# Patient Record
Sex: Female | Born: 1940 | Race: White | Hispanic: No | Smoking: Never smoker
Health system: Southern US, Community
[De-identification: ages and names within clinical notes are randomized; demographics above are authoritative.]

## PROBLEM LIST (undated history)

## (undated) DIAGNOSIS — F0390 Unspecified dementia without behavioral disturbance: Secondary | ICD-10-CM

## (undated) DIAGNOSIS — Z950 Presence of cardiac pacemaker: Secondary | ICD-10-CM

## (undated) DIAGNOSIS — F329 Major depressive disorder, single episode, unspecified: Secondary | ICD-10-CM

## (undated) DIAGNOSIS — E039 Hypothyroidism, unspecified: Secondary | ICD-10-CM

## (undated) DIAGNOSIS — F32A Depression, unspecified: Secondary | ICD-10-CM

## (undated) DIAGNOSIS — I499 Cardiac arrhythmia, unspecified: Secondary | ICD-10-CM

## (undated) DIAGNOSIS — F419 Anxiety disorder, unspecified: Secondary | ICD-10-CM

## (undated) DIAGNOSIS — D649 Anemia, unspecified: Secondary | ICD-10-CM

## (undated) HISTORY — PX: PACEMAKER INSERTION: SHX728

## (undated) HISTORY — PX: OTHER SURGICAL HISTORY: SHX169

## (undated) HISTORY — PX: BREAST CYST EXCISION: SHX579

## (undated) HISTORY — PX: INSERT / REPLACE / REMOVE PACEMAKER: SUR710

## (undated) HISTORY — PX: EYE SURGERY: SHX253

---

## 2003-06-03 HISTORY — PX: FRACTURE SURGERY: SHX138

## 2006-06-02 HISTORY — PX: OTHER SURGICAL HISTORY: SHX169

## 2013-11-11 DIAGNOSIS — I442 Atrioventricular block, complete: Secondary | ICD-10-CM | POA: Insufficient documentation

## 2014-01-23 DIAGNOSIS — E079 Disorder of thyroid, unspecified: Secondary | ICD-10-CM | POA: Insufficient documentation

## 2014-03-07 ENCOUNTER — Ambulatory Visit: Payer: Self-pay | Admitting: Family Medicine

## 2015-02-01 ENCOUNTER — Other Ambulatory Visit: Payer: Self-pay | Admitting: Internal Medicine

## 2015-02-01 DIAGNOSIS — Z1231 Encounter for screening mammogram for malignant neoplasm of breast: Secondary | ICD-10-CM

## 2015-02-14 ENCOUNTER — Ambulatory Visit: Payer: Self-pay

## 2015-02-20 ENCOUNTER — Ambulatory Visit
Admission: RE | Admit: 2015-02-20 | Discharge: 2015-02-20 | Disposition: A | Discharge status: Home / Self Care | Payer: Medicare Other | Source: Ambulatory Visit | Attending: Internal Medicine | Admitting: Internal Medicine

## 2015-02-20 DIAGNOSIS — Z1231 Encounter for screening mammogram for malignant neoplasm of breast: Secondary | ICD-10-CM | POA: Insufficient documentation

## 2015-08-30 DIAGNOSIS — Z950 Presence of cardiac pacemaker: Secondary | ICD-10-CM | POA: Insufficient documentation

## 2016-04-11 ENCOUNTER — Other Ambulatory Visit: Payer: Self-pay | Admitting: Internal Medicine

## 2016-04-11 DIAGNOSIS — Z1231 Encounter for screening mammogram for malignant neoplasm of breast: Secondary | ICD-10-CM

## 2016-05-28 ENCOUNTER — Ambulatory Visit: Payer: Medicare Other

## 2016-06-16 ENCOUNTER — Ambulatory Visit
Admission: RE | Admit: 2016-06-16 | Discharge: 2016-06-16 | Disposition: A | Discharge status: Home / Self Care | Payer: Medicare Other | Source: Ambulatory Visit | Attending: Internal Medicine | Admitting: Internal Medicine

## 2016-06-16 DIAGNOSIS — Z1231 Encounter for screening mammogram for malignant neoplasm of breast: Secondary | ICD-10-CM | POA: Diagnosis not present

## 2016-07-21 ENCOUNTER — Encounter: Payer: Self-pay | Admitting: Podiatry

## 2016-07-21 ENCOUNTER — Ambulatory Visit (INDEPENDENT_AMBULATORY_CARE_PROVIDER_SITE_OTHER): Payer: Medicare Other | Admitting: Podiatry

## 2016-07-21 DIAGNOSIS — M79676 Pain in unspecified toe(s): Secondary | ICD-10-CM | POA: Diagnosis not present

## 2016-07-21 DIAGNOSIS — B351 Tinea unguium: Secondary | ICD-10-CM

## 2016-07-21 NOTE — Progress Notes (Signed)
   Subjective:    Patient ID: Teresa PavlovLinda Richard, female    DOB: 03/24/1941, 76 y.o.   MRN: [id number]  HPI this patient presents to the office with chief complaint of painful long thick nails on both big toes.  They are painful walking and wearing her shoes. She says she has been previously treated for fungus in the right great toenail. She says she has pain along the outside border the big toe of the right foot, especially no evidence of any redness, swelling, pain or drainage. She presents the office for definitive evaluation and treatment    Review of Systems     Objective:   Physical Exam GENERAL APPEARANCE: Alert, conversant. Appropriately groomed. No acute distress.  VASCULAR: Pedal pulses are  palpable at  [facility] bilateral.  Capillary refill time is immediate to all digits,  Normal temperature gradient.  Digital hair growth is present bilateral  NEUROLOGIC: sensation is normal to 5.07 monofilament at 5/5 sites bilateral.  Light touch is intact bilateral, Muscle strength normal.  MUSCULOSKELETAL: acceptable muscle strength, tone and stability bilateral.  Intrinsic muscluature intact bilateral.   HAV  B/L with hammer toes 2,3  B/L  DERMATOLOGIC: skin color, texture, and turgor are within normal limits.  No preulcerative lesions or ulcers  are seen, no interdigital maceration noted.  No open lesions present.  . No drainage noted.  NAILS  Thick disfigured discolored nails hallux  B/L         Assessment & Plan:  Onychomycosis  B/L  IE  Debride nails  RTC 4 months.   Helane GuntherGregory Mayer DPM

## 2016-10-31 ENCOUNTER — Encounter: Payer: Self-pay | Admitting: Emergency Medicine

## 2016-10-31 ENCOUNTER — Emergency Department
Admission: EM | Admit: 2016-10-31 | Discharge: 2016-10-31 | Disposition: A | Discharge status: Home / Self Care | Payer: Medicare Other | Attending: Emergency Medicine | Admitting: Emergency Medicine

## 2016-10-31 ENCOUNTER — Emergency Department: Payer: Medicare Other

## 2016-10-31 DIAGNOSIS — Z79899 Other long term (current) drug therapy: Secondary | ICD-10-CM | POA: Diagnosis not present

## 2016-10-31 DIAGNOSIS — M25551 Pain in right hip: Secondary | ICD-10-CM | POA: Insufficient documentation

## 2016-10-31 DIAGNOSIS — Z7982 Long term (current) use of aspirin: Secondary | ICD-10-CM | POA: Diagnosis not present

## 2016-10-31 DIAGNOSIS — Z950 Presence of cardiac pacemaker: Secondary | ICD-10-CM | POA: Insufficient documentation

## 2016-10-31 MED ORDER — TRAMADOL HCL 50 MG PO TABS
50.0000 mg | ORAL_TABLET | Freq: Once | ORAL | Status: AC
  Administered 2016-10-31: 50 mg via ORAL
  Filled 2016-10-31: qty 1

## 2016-10-31 MED ORDER — CYCLOBENZAPRINE HCL 10 MG PO TABS
10.0000 mg | ORAL_TABLET | Freq: Two times a day (BID) | ORAL | 0 refills | Status: DC | PRN
Start: 2016-10-31 — End: 2018-07-07

## 2016-10-31 MED ORDER — KETOROLAC TROMETHAMINE 30 MG/ML IJ SOLN
30.0000 mg | Freq: Once | INTRAMUSCULAR | Status: AC
  Administered 2016-10-31: 30 mg via INTRAMUSCULAR
  Filled 2016-10-31: qty 1

## 2016-10-31 MED ORDER — TRAMADOL HCL 50 MG PO TABS: 50.0000 mg | ORAL_TABLET | Freq: Two times a day (BID) | ORAL | 0 refills | Status: DC | PRN

## 2016-10-31 NOTE — ED Triage Notes (Signed)
C/O right hip pain x 1 day.  Denies injury.  States pain started last night after she slid into a booth and when she slid out of booth, patient states hip hurt and it hurt to walk.

## 2016-10-31 NOTE — ED Provider Notes (Signed)
[facility] Emergency Department Provider Note   ____________________________________________   First MD Initiated Contact with Patient 10/31/16 1019     (approximate)  I have reviewed the triage vital signs and the nursing notes.   HISTORY  Chief Complaint Hip Pain    HPI Teresa Richard is a 76 y.o. female patient complaining of right hip pain for 1 day. 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 Pain increase with standing and ambulation. Patient stated no relief with conservative measures consisting of a heating pad. Patient rates the pain as a 7/10. Patient described a pain as "achy". Patient has a pacemaker History reviewed. No pertinent past medical history.  Patient Active Problem List   Diagnosis Date Noted  . 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 Thyroid disease 01/23/2014  . 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 BREAST CYST EXCISION Left 1980's   negative  . PACEMAKER INSERTION      Prior to Admission medications   Medication Sig Start Date End Date Taking? Authorizing Provider  aspirin EC 81 MG tablet Take by mouth.    [provider]  Cholecalciferol (VITAMIN D3) 1000 units CAPS Take by mouth.    [provider]  Cyanocobalamin (B-12) 500 MCG TABS Take by mouth.    [provider]  cyclobenzaprine (FLEXERIL) 10 MG tablet Take 1 tablet (10 mg total) by mouth 2 (two) times daily as needed. 10/31/16   Joni Reining, PA-C  levothyroxine (SYNTHROID, LEVOTHROID) 75 MCG tablet Take by mouth. 03/03/16   [provider]  levothyroxine (SYNTHROID, LEVOTHROID) 75 MCG tablet  07/07/16   [provider]  magnesium oxide (MAG-OX) 400 MG tablet Take by mouth.    [provider]  traMADol (ULTRAM) 50 MG tablet Take 1 tablet (50 mg total) by mouth every 12 (twelve) hours as needed.  10/31/16   Joni Reining, PA-C  traMADol (ULTRAM) 50 MG tablet Take 1 tablet (50 mg total) by mouth every 12 (twelve) hours as needed. 10/31/16   Joni Reining, PA-C    Allergies Hydrocodone and Oxycodone  Family History  Problem Relation Age of Onset  . Breast cancer Neg Hx     Social History Social History  Substance Use Topics  . Smoking status: Unknown If Ever Smoked  . Smokeless tobacco: Never Used  . Alcohol use Not on file    Review of Systems  Constitutional: No fever/chills Eyes: No visual changes. ENT: No sore throat. Cardiovascular: Denies chest pain. Respiratory: Denies shortness of breath. Gastrointestinal: No abdominal pain.  No nausea, no vomiting.  No diarrhea.  No constipation. Genitourinary: Negative for dysuria. Musculoskeletal: Positive for right hip pain Skin: Negative for rash. Neurological: Negative for headaches, focal weakness or numbness. Endocrine:Hypothyroidism Allergic/Immunilogical: Hydrocodone and oxycodone ____________________________________________   PHYSICAL EXAM:  VITAL SIGNS: ED Triage Vitals  Enc Vitals Group     BP 10/31/16 0924 110/64     Pulse Rate 10/31/16 0924 82     Resp 10/31/16 0924 18     Temp 10/31/16 0924 98.1 F (36.7 C)     Temp Source 10/31/16 0924 Oral     SpO2 10/31/16 0924 100 %     Weight 10/31/16 0926 124 lb (56.2 kg)     Height 10/31/16 0926 5\' 5"  (1.651 m)     Head Circumference --  Peak Flow --      Pain Score 10/31/16 0920 7     Pain Loc --      Pain Edu? --      Excl. in GC? --     Constitutional: Alert and oriented. Well appearing and in no acute distress. Eyes: Conjunctivae are normal. PERRL. EOMI. Head: Atraumatic. Nose: No congestion/rhinnorhea. Mouth/Throat: Mucous membranes are moist.  Oropharynx non-erythematous. Neck: No stridor.  No cervical spine tenderness to palpation. Hematological/Lymphatic/Immunilogical: No cervical lymphadenopathy. Cardiovascular: Normal rate, regular  rhythm. Grossly normal heart sounds.  Good peripheral circulation. Respiratory: Normal respiratory effort.  No retractions. Lungs CTAB. Gastrointestinal: Soft and nontender. No distention. No abdominal bruits. No CVA tenderness. Musculoskeletal: No lower extremity tenderness nor edema.  No joint effusions. Neurologic:  Normal speech and language. No gross focal neurologic deficits are appreciated. No gait instability. Skin:  Skin is warm, dry and intact. No rash noted. Psychiatric: Mood and affect are normal. Speech and behavior are normal.  ____________________________________________   LABS (all labs ordered are listed, but only abnormal results are displayed)  Labs Reviewed - No data to display ____________________________________________  EKG   ____________________________________________  RADIOLOGY   ____________________________________________   PROCEDURES  Procedure(s) performed: None  Procedures  Critical Care performed: No  ____________________________________________   INITIAL IMPRESSION / ASSESSMENT AND PLAN / ED COURSE  Pertinent labs & imaging results that were available during my care of the patient were reviewed by me and considered in my medical decision making (see chart for details).  Right hip pain. Discuss negative x-ray and CT findings of right hip. Patient continues to have pain with weightbearing. Advised to follow-up with PCP if condition does not improve in the next 3 days. Return by ER for condition worsens.      ____________________________________________   FINAL CLINICAL IMPRESSION(S) / ED DIAGNOSES  Final diagnoses:  Right hip pain      NEW MEDICATIONS STARTED DURING THIS VISIT:  Discharge Medication List as of 10/31/2016 12:17 PM    START taking these medications   Details  cyclobenzaprine (FLEXERIL) 10 MG tablet Take 1 tablet (10 mg total) by mouth 2 (two) times daily as needed., Starting Fri 10/31/2016, Print    traMADol  (ULTRAM) 50 MG tablet Take 1 tablet (50 mg total) by mouth every 12 (twelve) hours as needed., Starting Fri 10/31/2016, Print         Note:  This document was prepared using Dragon voice recognition software and may include unintentional dictation errors.    Joni ReiningSmith, Ronald K, PA-C 10/31/16 1535    Don PerkingVeronese, [place], MD 11/04/16 [phone]

## 2016-11-05 ENCOUNTER — Encounter: Payer: Self-pay | Admitting: *Deleted

## 2016-11-11 ENCOUNTER — Encounter: Payer: Self-pay | Admitting: *Deleted

## 2016-11-11 ENCOUNTER — Encounter
Admission: RE | Disposition: A | Discharge status: Home / Self Care | Payer: Self-pay | Source: Ambulatory Visit | Attending: Ophthalmology

## 2016-11-11 ENCOUNTER — Ambulatory Visit: Payer: Medicare Other | Admitting: Anesthesiology

## 2016-11-11 ENCOUNTER — Ambulatory Visit
Admission: RE | Admit: 2016-11-11 | Discharge: 2016-11-11 | Disposition: A | Discharge status: Home / Self Care | Payer: Medicare Other | Source: Ambulatory Visit | Attending: Ophthalmology | Admitting: Ophthalmology

## 2016-11-11 DIAGNOSIS — H2512 Age-related nuclear cataract, left eye: Secondary | ICD-10-CM | POA: Insufficient documentation

## 2016-11-11 DIAGNOSIS — I442 Atrioventricular block, complete: Secondary | ICD-10-CM

## 2016-11-11 DIAGNOSIS — Z7982 Long term (current) use of aspirin: Secondary | ICD-10-CM | POA: Diagnosis not present

## 2016-11-11 DIAGNOSIS — Z79899 Other long term (current) drug therapy: Secondary | ICD-10-CM | POA: Diagnosis not present

## 2016-11-11 DIAGNOSIS — Z950 Presence of cardiac pacemaker: Secondary | ICD-10-CM | POA: Insufficient documentation

## 2016-11-11 DIAGNOSIS — E039 Hypothyroidism, unspecified: Secondary | ICD-10-CM | POA: Diagnosis not present

## 2016-11-11 HISTORY — DX: Hypothyroidism, unspecified: E03.9

## 2016-11-11 HISTORY — PX: CATARACT EXTRACTION W/PHACO: SHX586

## 2016-11-11 SURGERY — PHACOEMULSIFICATION, CATARACT, WITH IOL INSERTION
Anesthesia: Monitor Anesthesia Care | Site: Eye | Laterality: Left | Wound class: Clean

## 2016-11-11 MED ORDER — LIDOCAINE HCL (PF) 4 % IJ SOLN
INTRAOCULAR | Status: DC | PRN
  Administered 2016-11-11: 2 mL via OPHTHALMIC

## 2016-11-11 MED ORDER — MOXIFLOXACIN HCL 0.5 % OP SOLN
OPHTHALMIC | Status: DC | PRN
  Administered 2016-11-11: .2 mL via OPHTHALMIC

## 2016-11-11 MED ORDER — EPINEPHRINE PF 1 MG/ML IJ SOLN
INTRAMUSCULAR | Status: AC
  Filled 2016-11-11: qty 2

## 2016-11-11 MED ORDER — ARMC OPHTHALMIC DILATING DROPS
1.0000 "application " | OPHTHALMIC | Status: AC
  Administered 2016-11-11 (×3): 1 via OPHTHALMIC

## 2016-11-11 MED ORDER — BSS IO SOLN
INTRAOCULAR | Status: DC | PRN
  Administered 2016-11-11: 1 mL via OPHTHALMIC

## 2016-11-11 MED ORDER — FENTANYL CITRATE (PF) 100 MCG/2ML IJ SOLN
INTRAMUSCULAR | Status: AC
  Filled 2016-11-11: qty 2

## 2016-11-11 MED ORDER — NA CHONDROIT SULF-NA HYALURON 40-17 MG/ML IO SOLN
INTRAOCULAR | Status: AC
  Filled 2016-11-11: qty 1

## 2016-11-11 MED ORDER — POVIDONE-IODINE 5 % OP SOLN
OPHTHALMIC | Status: DC | PRN
  Administered 2016-11-11: 1 via OPHTHALMIC

## 2016-11-11 MED ORDER — SODIUM CHLORIDE 0.9 % IV SOLN
INTRAVENOUS | Status: DC
  Administered 2016-11-11: 09:00:00 via INTRAVENOUS

## 2016-11-11 MED ORDER — FENTANYL CITRATE (PF) 100 MCG/2ML IJ SOLN
INTRAMUSCULAR | Status: DC | PRN
  Administered 2016-11-11: 50 ug via INTRAVENOUS
  Administered 2016-11-11: 25 ug via INTRAVENOUS

## 2016-11-11 MED ORDER — POVIDONE-IODINE 5 % OP SOLN
OPHTHALMIC | Status: AC
  Filled 2016-11-11: qty 30

## 2016-11-11 MED ORDER — NA CHONDROIT SULF-NA HYALURON 40-17 MG/ML IO SOLN
INTRAOCULAR | Status: DC | PRN
  Administered 2016-11-11: 1 mL via INTRAOCULAR

## 2016-11-11 MED ORDER — LIDOCAINE HCL (PF) 2 % IJ SOLN
INTRAMUSCULAR | Status: AC
  Filled 2016-11-11: qty 2

## 2016-11-11 MED ORDER — CARBACHOL 0.01 % IO SOLN
INTRAOCULAR | Status: DC | PRN
  Administered 2016-11-11: .5 mL via INTRAOCULAR

## 2016-11-11 MED ORDER — MOXIFLOXACIN HCL 0.5 % OP SOLN: 1.0000 [drp] | OPHTHALMIC | Status: DC | PRN

## 2016-11-11 SURGICAL SUPPLY — 14 items
GLOVE BIO SURGEON STRL SZ8 (GLOVE) ×3 IMPLANT
GLOVE BIOGEL M 6.5 STRL (GLOVE) ×3 IMPLANT
GLOVE SURG LX 8.0 MICRO (GLOVE) ×2
GLOVE SURG LX STRL 8.0 MICRO (GLOVE) ×1 IMPLANT
GOWN STRL REUS W/ TWL LRG LVL3 (GOWN DISPOSABLE) ×2 IMPLANT
GOWN STRL REUS W/TWL LRG LVL3 (GOWN DISPOSABLE) ×4
LENS IOL TECNIS ITEC 19.0 (Intraocular Lens) ×3 IMPLANT
PACK CATARACT (MISCELLANEOUS) ×3 IMPLANT
PACK CATARACT BRASINGTON LX (MISCELLANEOUS) ×3 IMPLANT
SOL BSS BAG (MISCELLANEOUS) ×3
SOLUTION BSS BAG (MISCELLANEOUS) ×1 IMPLANT
SYR 5ML LL (SYRINGE) ×3 IMPLANT
WATER STERILE IRR 250ML POUR (IV SOLUTION) ×3 IMPLANT
WIPE NON LINTING 3.25X3.25 (MISCELLANEOUS) ×3 IMPLANT

## 2016-11-11 NOTE — Transfer of Care (Signed)
Immediate Anesthesia Transfer of Care Note  Patient: Teresa Richard  Procedure(s) Performed: Procedure(s) with comments: CATARACT EXTRACTION PHACO AND INTRAOCULAR LENS PLACEMENT (IOC) (Left) - [place]  00:55 AP% 14.7 CDE 8.15 Fluid pack lot # [id number]  Patient Location: PACU and Short Stay  Anesthesia Type:MAC  Level of Consciousness: awake, alert  and oriented  Airway & Oxygen Therapy: Patient Spontanous Breathing  Post-op Assessment: Report given to RN and Post -op Vital signs reviewed and stable  Post vital signs: Reviewed and stable  Last Vitals:  Vitals:   11/11/16 1105 11/11/16 1106  BP: (!) 147/62 (!) 147/62  Pulse: 66 67  Resp: 16   Temp: 36.5 C     Last Pain:  Vitals:   11/11/16 0905  TempSrc: Oral         Complications: No apparent anesthesia complications

## 2016-11-11 NOTE — Op Note (Signed)
PREOPERATIVE DIAGNOSIS:  Nuclear sclerotic cataract of the left eye.   POSTOPERATIVE DIAGNOSIS:  nuclear sclerotic cataract left eye   OPERATIVE PROCEDURE:  Procedure(s): CATARACT EXTRACTION PHACO AND INTRAOCULAR LENS PLACEMENT (IOC)   SURGEON:  Galen ManilaWilliam Awab Abebe, MD.   ANESTHESIA:   Anesthesiologist: Naomie DeanKephart, Peggyann Zwiefelhofer K, MD CRNA: Omer JackWeatherly, Janice, CRNA  1.      Managed anesthesia care. 2.      Topical tetracaine drops followed by 2% Xylocaine jelly applied in the preoperative holding area.   COMPLICATIONS:  None.   TECHNIQUE:   Stop and chop   DESCRIPTION OF PROCEDURE:  The patient was examined and consented in the preoperative holding area where the aforementioned topical anesthesia was applied to the left eye and then brought back to the Operating Room where the left eye was prepped and draped in the usual sterile ophthalmic fashion and a lid speculum was placed. A paracentesis was created with the side port blade and the anterior chamber was filled with viscoelastic. A near clear corneal incision was performed with the steel keratome. A continuous curvilinear capsulorrhexis was performed with a cystotome followed by the capsulorrhexis forceps. Hydrodissection and hydrodelineation were carried out with BSS on a blunt cannula. The lens was removed in a stop and chop  technique and the remaining cortical material was removed with the irrigation-aspiration handpiece. The capsular bag was inflated with viscoelastic and the Technis [id number] lens was placed in the capsular bag without complication. The remaining viscoelastic was removed from the eye with the irrigation-aspiration handpiece. The wounds were hydrated. The anterior chamber was flushed with Miostat and the eye was inflated to physiologic pressure. 0.1 mL of cefuroxime concentration 10 mg/mL was placed in the anterior chamber. The wounds were found to be water tight. The eye was dressed with Vigamox. The patient was given protective glasses  to wear throughout the day and a shield with which to sleep tonight. The patient was also given drops with which to begin a drop regimen today and will follow-up with me in one day.  Implant Name Type Inv. Item Serial No. Manufacturer Lot No. LRB No. Used  LENS IOL DIOP 19.0 - [id number] Intraocular Lens LENS IOL DIOP 19.0 [id number]   Left 1   Procedure(s) with comments: CATARACT EXTRACTION PHACO AND INTRAOCULAR LENS PLACEMENT (IOC) (Left) - [place]  00:55 AP% 14.7 CDE 8.15 Fluid pack lot # [id number]  Electronically signed: Lucila Klecka LOUIS 11/11/2016 11:04 AM

## 2016-11-11 NOTE — Anesthesia Postprocedure Evaluation (Signed)
Anesthesia Post Note  Patient: Teresa Richard  Procedure(s) Performed: Procedure(s) (LRB): CATARACT EXTRACTION PHACO AND INTRAOCULAR LENS PLACEMENT (IOC) (Left)  Patient location during evaluation: Short Stay Anesthesia Type: MAC Level of consciousness: awake, awake and alert and oriented Pain management: pain level controlled Vital Signs Assessment: post-procedure vital signs reviewed and stable Respiratory status: spontaneous breathing Cardiovascular status: stable Postop Assessment: no headache and adequate PO intake Anesthetic complications: no     Last Vitals:  Vitals:   11/11/16 1105 11/11/16 1106  BP: (!) 147/62 (!) 147/62  Pulse: 66 67  Resp: 16   Temp: 36.5 C     Last Pain:  Vitals:   11/11/16 0905  TempSrc: [name]

## 2016-11-11 NOTE — Anesthesia Post-op Follow-up Note (Cosign Needed)
Anesthesia QCDR form completed.        

## 2016-11-11 NOTE — H&P (Signed)
All labs reviewed. Abnormal studies sent to patients PCP when indicated.  Previous H&P reviewed, patient examined, there are NO CHANGES.  Teresa Richard LOUIS6/12/201810:35 AM

## 2016-11-11 NOTE — Discharge Instructions (Signed)
Eye Surgery Discharge Instructions  Expect mild scratchy sensation or mild soreness. DO NOT RUB YOUR EYE!  The day of surgery:  Minimal physical activity, but bed rest is not required  No reading, computer work, or close hand work  No bending, lifting, or straining.  May watch TV  For 24 hours:  No driving, legal decisions, or alcoholic beverages  Safety precautions  Eat anything you prefer: It is better to start with liquids, then soup then solid foods.  _____ Eye patch should be worn until postoperative exam tomorrow.  ____ Solar shield eyeglasses should be worn for comfort in the sunlight/patch while sleeping  Resume all regular medications including aspirin or Coumadin if these were discontinued prior to surgery. You may shower, bathe, shave, or wash your hair. Tylenol may be taken for mild discomfort.  Call your doctor if you experience significant pain, nausea, or vomiting, fever > 101 or other signs of infection. [phone] or [phone] Specific instructions:  Follow-up Information    Galen ManilaPorfilio, William, MD Follow up.   Specialty:  Ophthalmology Why:  June 13 at 9:40am Contact information: [address] [place] [address] [phone]

## 2016-11-11 NOTE — Anesthesia Preprocedure Evaluation (Signed)
Anesthesia Evaluation  Patient identified by MRN, date of birth, ID band Patient awake    Reviewed: Allergy & Precautions, NPO status , Patient's Chart, lab work & pertinent test results  History of Anesthesia Complications Negative for: history of anesthetic complications  Airway Mallampati: II       Dental  (+) Upper Dentures, Lower Dentures, Loose   Pulmonary neg pulmonary ROS,           Cardiovascular negative cardio ROS  + dysrhythmias (3rd degree HB treated with pacemaker) + pacemaker      Neuro/Psych negative neurological ROS     GI/Hepatic negative GI ROS, Neg liver ROS,   Endo/Other  Hypothyroidism   Renal/GU negative Renal ROS     Musculoskeletal   Abdominal   Peds  Hematology   Anesthesia Other Findings   Reproductive/Obstetrics                             Anesthesia Physical Anesthesia Plan  ASA: III  Anesthesia Plan: MAC   Post-op Pain Management:    Induction: Intravenous  PONV Risk Score and Plan:   Airway Management Planned:   Additional Equipment:   Intra-op Plan:   Post-operative Plan:   Informed Consent: I have reviewed the patients History and Physical, chart, labs and discussed the procedure including the risks, benefits and alternatives for the proposed anesthesia with the patient or authorized representative who has indicated his/her understanding and acceptance.     Plan Discussed with:   Anesthesia Plan Comments:         Anesthesia Quick Evaluation

## 2016-11-12 ENCOUNTER — Encounter: Payer: Self-pay | Admitting: Ophthalmology

## 2016-11-20 ENCOUNTER — Ambulatory Visit: Payer: Medicare Other | Admitting: Podiatry

## 2016-11-29 ENCOUNTER — Emergency Department
Admission: EM | Admit: 2016-11-29 | Discharge: 2016-11-29 | Disposition: A | Discharge status: Home / Self Care | Payer: Medicare Other | Attending: Emergency Medicine | Admitting: Emergency Medicine

## 2016-11-29 ENCOUNTER — Emergency Department: Payer: Medicare Other

## 2016-11-29 ENCOUNTER — Encounter: Payer: Self-pay | Admitting: Emergency Medicine

## 2016-11-29 DIAGNOSIS — E039 Hypothyroidism, unspecified: Secondary | ICD-10-CM | POA: Insufficient documentation

## 2016-11-29 DIAGNOSIS — Z7982 Long term (current) use of aspirin: Secondary | ICD-10-CM | POA: Insufficient documentation

## 2016-11-29 DIAGNOSIS — R0789 Other chest pain: Secondary | ICD-10-CM | POA: Diagnosis not present

## 2016-11-29 DIAGNOSIS — Z950 Presence of cardiac pacemaker: Secondary | ICD-10-CM | POA: Insufficient documentation

## 2016-11-29 DIAGNOSIS — R079 Chest pain, unspecified: Secondary | ICD-10-CM | POA: Diagnosis present

## 2016-11-29 DIAGNOSIS — Z79899 Other long term (current) drug therapy: Secondary | ICD-10-CM | POA: Diagnosis not present

## 2016-11-29 LAB — BASIC METABOLIC PANEL
Anion gap: 5 (ref 5–15)
BUN: 25 mg/dL — AB (ref 6–20)
CO2: 31 mmol/L (ref 22–32)
CREATININE: 0.71 mg/dL (ref 0.44–1.00)
Calcium: 9.7 mg/dL (ref 8.9–10.3)
Chloride: 102 mmol/L (ref 101–111)
GFR calc Af Amer: >60 mL/min (ref >60)
Glucose, Bld: 93 mg/dL (ref 65–99)
Potassium: 4.4 mmol/L (ref 3.5–5.1)
SODIUM: 138 mmol/L (ref 135–145)

## 2016-11-29 LAB — CBC
HCT: 38 % (ref 35.0–47.0)
Hemoglobin: 13.1 g/dL (ref 12.0–16.0)
MCH: 30.4 pg (ref 26.0–34.0)
MCHC: 34.4 g/dL (ref 32.0–36.0)
MCV: 88.3 fL (ref 80.0–100.0)
PLATELETS: 176 10*3/uL (ref 150–440)
RBC: 4.31 MIL/uL (ref 3.80–5.20)
RDW: 14.1 % (ref 11.5–14.5)
WBC: 4.7 10*3/uL (ref 3.6–11.0)

## 2016-11-29 LAB — TROPONIN I: Troponin I: <0.03 ng/mL (ref <0.03)

## 2016-11-29 NOTE — ED Triage Notes (Signed)
Pt to ed Via POV for chest pain that has been present for 2 days. Pt states that she has pain on palpation, pain feels like a burning pain and is in the center of her chest. Pt denies shortness of breath or vomiting. Pt states that she did have a couple episodes of nausea and dizziness on Tuesday and Thursday.  Pt is in NAD at this time.

## 2016-11-29 NOTE — ED Provider Notes (Signed)
[facility] Emergency Department Provider Note  ____________________________________________   First MD Initiated Contact with Patient 11/29/16 2257     (approximate)  I have reviewed the triage vital signs and the nursing notes.   HISTORY  Chief Complaint Chest Pain and Dizziness    HPI Teresa Richard is a 76 y.o. female with a history of dual chamber AV pacer (placed years ago, last checkup with Dr. Lady Gary between 2-3 months ago) who presents for evaluation of 2 days of anterior sternal pain that is tender to the touch.  Describes the pain as burning and sensitive when she touches near the bottom of her breastbone.  She is not having any difficulty breathing.  She denies fever/chills, recent nausea/vomiting, abdominal pain, dysuria.  She does state that about 5 days ago she did not feel well throughout the course of the day and had some dizziness and lightheadedness but after she tried to drink plenty of fluids that seemed to improve.  She is essentially in her normal state of health, but given her pacemaker she was concerned about this new onset and persistent burning chest pain.  The pain is mild to moderate depending on if she is pushing on it or not.  She has not tried taking any medications to make it go away.  She has a checkup with Dr. Lady Gary in about 5 days, previously scheduled to check on the functioning of her pacemaker.   Past Medical History:  Diagnosis Date  . Hypothyroidism     Patient Active Problem List   Diagnosis Date Noted  . 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 Thyroid disease 01/23/2014  . 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 BREAST CYST EXCISION Left 1980's   negative  . CATARACT EXTRACTION W/PHACO Left 11/11/2016   Procedure: CATARACT EXTRACTION PHACO AND INTRAOCULAR LENS PLACEMENT (IOC);  Surgeon: Galen Manila, MD;  Location: [facility] ORS;  Service: Ophthalmology;   Laterality: Left;  [place]  00:55 AP% 14.7 CDE 8.15 Fluid pack lot # [id number]  . CTR    . EYE SURGERY    . FRACTURE SURGERY Right    LEG  . INSERT / REPLACE / REMOVE PACEMAKER    . PACEMAKER INSERTION    . RCR      Prior to Admission medications   Medication Sig Start Date End Date Taking? Authorizing Provider  aspirin EC 81 MG tablet Take by mouth.    [provider]  Cholecalciferol (VITAMIN D3) 1000 units CAPS Take by mouth.    [provider]  Cyanocobalamin (B-12) 500 MCG TABS Take by mouth.    [provider]  cyclobenzaprine (FLEXERIL) 10 MG tablet Take 1 tablet (10 mg total) by mouth 2 (two) times daily as needed. 10/31/16   Joni Reining, PA-C  levothyroxine (SYNTHROID, LEVOTHROID) 75 MCG tablet Take by mouth. 03/03/16   [provider]  magnesium oxide (MAG-OX) 400 MG tablet Take by mouth.    [provider]  traMADol (ULTRAM) 50 MG tablet Take 1 tablet (50 mg total) by mouth every 12 (twelve) hours as needed. 10/31/16   Joni Reining, PA-C    Allergies Hydrocodone and Oxycodone  Family History  Problem Relation Age of Onset  . Breast cancer Neg Hx     Social History Social History  Substance Use Topics  . Smoking status: Unknown If Ever Smoked  . Smokeless tobacco: Never Used  . Alcohol  use No    Review of Systems Constitutional: No fever/chills Eyes: No visual changes. ENT: No sore throat. Cardiovascular: Anterior chest wall pain at the bottom of her sternum, tender to palpation Respiratory: Denies shortness of breath. Gastrointestinal: No abdominal pain.  No nausea, no vomiting.  No diarrhea.  No constipation. Genitourinary: Negative for dysuria. Musculoskeletal: Negative for neck pain.  Negative for back pain. Integumentary: Negative for rash. Neurological: Negative for headaches, focal weakness or numbness.   ____________________________________________   PHYSICAL EXAM:  VITAL SIGNS: ED Triage Vitals    Enc Vitals Group     BP 11/29/16 1852 (!) 157/64     Pulse Rate 11/29/16 1852 60     Resp 11/29/16 1852 16     Temp 11/29/16 1852 98.1 F (36.7 C)     Temp Source 11/29/16 1852 Oral     SpO2 11/29/16 1852 100 %     Weight 11/29/16 1853 56.2 kg (124 lb)     Height --      Head Circumference --      Peak Flow --      Pain Score 11/29/16 2235 1     Pain Loc --      Pain Edu? --      Excl. in GC? --     Constitutional: Alert and oriented. Well appearing and in no acute distress. Eyes: Conjunctivae are normal.  Head: Atraumatic. Nose: No congestion/rhinnorhea. Mouth/Throat: Mucous membranes are moist. Neck: No stridor.  No meningeal signs.   Cardiovascular: Normal rate, regular rhythm. Good peripheral circulation. Grossly normal heart sounds.  Pacemaker in place.  Point tenderness to palpation at the bottom of the sternum/xiphoid process with out any erythema or other external evidence of infection. Respiratory: Normal respiratory effort.  No retractions. Lungs CTAB. Gastrointestinal: Soft and nontender. No distention.  Musculoskeletal: No lower extremity tenderness nor edema. No gross deformities of extremities. Neurologic:  Normal speech and language. No gross focal neurologic deficits are appreciated.  Skin:  Skin is warm, dry and intact. No rash noted. Psychiatric: Mood and affect are normal. Speech and behavior are normal.  ____________________________________________   LABS (all labs ordered are listed, but only abnormal results are displayed)  Labs Reviewed  BASIC METABOLIC PANEL - Abnormal; Notable for the following:       Result Value   BUN 25 (*)    All other components within normal limits  CBC  TROPONIN I   ____________________________________________  EKG  ED ECG REPORT I, Jocsan Mcginley, the attending physician, personally viewed and interpreted this ECG.  Date: 11/29/2016 EKG Time: 19:12 Rate: 60 Rhythm: AV dual paced rhythm QRS Axis:  normal Intervals: normal paced rhythm ST/T Wave abnormalities: normal Narrative Interpretation: unremarkable  ____________________________________________  RADIOLOGY   Dg Chest 2 View  Result Date: 11/29/2016 CLINICAL DATA:  Chest pain EXAM: CHEST  2 VIEW COMPARISON:  None. FINDINGS: A 3 lead pacemaker is identified. No pneumothorax. No suspicious nodules, masses, or infiltrates. The cardiomediastinal silhouette is normal. No acute abnormalities. IMPRESSION: No active cardiopulmonary disease. Electronically Signed   By: Gerome Samavid  Williams III M.D   On: 11/29/2016 18:36    ____________________________________________   PROCEDURES  Critical Care performed: No   Procedure(s) performed:   Procedures   ____________________________________________   INITIAL IMPRESSION / ASSESSMENT AND PLAN / ED COURSE  Pertinent labs & imaging results that were available during my care of the patient were reviewed by me and considered in my medical decision making (see chart for details).  The patient is well-appearing and in no acute distress with reassuring vital signs, nonacute chest x-ray, and normal lab work.  I spent some time discussing chest wall/sternal tenderness, the possibility of costochondritis, and the fact that her workup was reassuring today.  The symptoms have been going on for 2 days with a negative troponin and it is very superficial tenderness that is reproducible to the touch.  There is no indication for repeat troponin.  She has no contraindications to ibuprofen so I suggested she take ibuprofen 400 mg 3 times a day with meals for about 5 days until she follows up with Dr. Lady Gary.  I considered a Lidoderm patch but I think that is probably unnecessary and she deserves a trial of NSAID therapy first.  I gave my usual and customary return precautions.  The patient and family are in agreement with the plan for outpatient follow-up and return if necessary.       ____________________________________________  FINAL CLINICAL IMPRESSION(S) / ED DIAGNOSES  Final diagnoses:  Tenderness of chest wall     MEDICATIONS GIVEN DURING THIS VISIT:  Medications - No data to display   NEW OUTPATIENT MEDICATIONS STARTED DURING THIS VISIT:  New Prescriptions   No medications on file    Modified Medications   No medications on file    Discontinued Medications   No medications on file     Note:  This document was prepared using Dragon voice recognition software and may include unintentional dictation errors.    Loleta Rose, MD 11/29/16 2328

## 2016-11-29 NOTE — Discharge Instructions (Signed)
You have been seen in the Emergency Department (ED) today for chest pain.  As we have discussed today's test results are normal, and we believe your pain is due to pain/strain and/or inflammation of the muscles and/or cartilage of your chest wall.  We recommend you take ibuprofen 400 mg three times a day with meals for the next 5 days (unless you have been told previously not to take ibuprofen or NSAIDs in general).  You may also take Tylenol according to the label instructions.  Read through the included information for additional treatment recommendations and precautions.  Continue to take your regular medications.   Return to the Emergency Department (ED) if you experience any further chest pain/pressure/tightness, difficulty breathing, or sudden sweating, or other symptoms that concern you. 

## 2017-08-02 ENCOUNTER — Emergency Department
Admission: EM | Admit: 2017-08-02 | Discharge: 2017-08-02 | Disposition: A | Discharge status: Home / Self Care | Payer: Medicare Other | Attending: Student in an Organized Health Care Education/Training Program | Admitting: Student in an Organized Health Care Education/Training Program

## 2017-08-02 ENCOUNTER — Encounter: Payer: Self-pay | Admitting: Emergency Medicine

## 2017-08-02 ENCOUNTER — Emergency Department: Payer: Medicare Other

## 2017-08-02 ENCOUNTER — Other Ambulatory Visit: Payer: Self-pay

## 2017-08-02 DIAGNOSIS — Z79899 Other long term (current) drug therapy: Secondary | ICD-10-CM | POA: Diagnosis not present

## 2017-08-02 DIAGNOSIS — R002 Palpitations: Secondary | ICD-10-CM | POA: Diagnosis present

## 2017-08-02 DIAGNOSIS — E039 Hypothyroidism, unspecified: Secondary | ICD-10-CM | POA: Insufficient documentation

## 2017-08-02 DIAGNOSIS — Z950 Presence of cardiac pacemaker: Secondary | ICD-10-CM | POA: Insufficient documentation

## 2017-08-02 DIAGNOSIS — Z7982 Long term (current) use of aspirin: Secondary | ICD-10-CM | POA: Diagnosis not present

## 2017-08-02 LAB — CBC
HEMATOCRIT: 37.4 % (ref 35.0–47.0)
Hemoglobin: 12.3 g/dL (ref 12.0–16.0)
MCH: 29.1 pg (ref 26.0–34.0)
MCHC: 32.9 g/dL (ref 32.0–36.0)
MCV: 88.4 fL (ref 80.0–100.0)
Platelets: 183 10*3/uL (ref 150–440)
RBC: 4.23 MIL/uL (ref 3.80–5.20)
RDW: 14.3 % (ref 11.5–14.5)
WBC: 3.5 10*3/uL — AB (ref 3.6–11.0)

## 2017-08-02 LAB — BASIC METABOLIC PANEL
Anion gap: 6 (ref 5–15)
BUN: 19 mg/dL (ref 6–20)
CHLORIDE: 106 mmol/L (ref 101–111)
CO2: 28 mmol/L (ref 22–32)
Calcium: 9 mg/dL (ref 8.9–10.3)
Creatinine, Ser: 0.7 mg/dL (ref 0.44–1.00)
GFR calc Af Amer: >60 mL/min (ref >60)
GFR calc non Af Amer: >60 mL/min (ref >60)
Glucose, Bld: 89 mg/dL (ref 65–99)
POTASSIUM: 4.3 mmol/L (ref 3.5–5.1)
Sodium: 140 mmol/L (ref 135–145)

## 2017-08-02 LAB — TROPONIN I: Troponin I: <0.03 ng/mL (ref <0.03)

## 2017-08-02 NOTE — ED Notes (Signed)
Pacemaker tech at bedside

## 2017-08-02 NOTE — ED Notes (Signed)
Waiting for tech to interrogate pacemaker.

## 2017-08-02 NOTE — ED Triage Notes (Signed)
Pt presents to ED via POV. Pt states has had a pacemaker x 13 yrs. Pt states recently had a check up. Since then she "can feel it beating then calming down, then beating again. Pt denies any chest pain at this time. Pt states was seen by cardiologist and had a Holter monitor placed and has since finished the study.

## 2017-08-02 NOTE — ED Notes (Signed)
Unable to interrogate pacemaker in the ED after several attempts and phone calls to the company. Technician will come to the ED to complete task.

## 2017-08-02 NOTE — ED Provider Notes (Signed)
[facility] Emergency Department Provider Note    None    (approximate)  I have reviewed the triage vital signs and the nursing notes.   HISTORY  Chief Complaint Palpitations    HPI Teresa Richard is a 77 y.o. female history of third-degree heart block status post pacemaker roughly 6 years ago presents with several days of palpitations and concerned that her pacemaker is going to fail.  Denies any chest pains.  States that she will intermittently feel the pacemaker "kicking in "but does not have any right now.  States she saw her cardiologist this week and everything looked fine but had not gotten "results from the tests ".  Due to persistent palpitations she came to the ER for evaluation because she states if the pacemaker "goes out then I go out."  Past Medical History:  Diagnosis Date  . Hypothyroidism    Family History  Problem Relation Age of Onset  . Breast cancer Neg Hx    Past Surgical History:  Procedure Laterality Date  . BREAST CYST EXCISION Left 1980's   negative  . CATARACT EXTRACTION W/PHACO Left 11/11/2016   Procedure: CATARACT EXTRACTION PHACO AND INTRAOCULAR LENS PLACEMENT (IOC);  Surgeon: Galen ManilaPorfilio, William, MD;  Location: [facility] ORS;  Service: Ophthalmology;  Laterality: Left;  US  00:55 AP% 14.7 CDE 8.15 Fluid pack lot # [id number]  . CTR    . EYE SURGERY    . FRACTURE SURGERY Right    LEG  . INSERT / REPLACE / REMOVE PACEMAKER    . PACEMAKER INSERTION    . RCR     Patient Active Problem List   Diagnosis Date Noted  . 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 Thyroid disease 01/23/2014  . 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 Authorizing Provider  aspirin EC 81 MG tablet Take 81 mg by mouth daily.    Yes [provider]  Cholecalciferol (VITAMIN D3) 1000 units CAPS Take 1,000 Units by mouth daily.    Yes [provider]    levothyroxine (SYNTHROID, LEVOTHROID) 75 MCG tablet Take 75 mcg by mouth every morning.    Yes [provider]  magnesium oxide (MAG-OX) 400 MG tablet Take 400 mg by mouth daily.    Yes [provider]  cyclobenzaprine (FLEXERIL) 10 MG tablet Take 1 tablet (10 mg total) by mouth 2 (two) times daily as needed. Patient not taking: Reported on 08/02/2017 10/31/16   Joni ReiningSmith, Ronald K, PA-C  traMADol (ULTRAM) 50 MG tablet Take 1 tablet (50 mg total) by mouth every 12 (twelve) hours as needed. Patient not taking: Reported on 08/02/2017 10/31/16   Joni ReiningSmith, Ronald K, PA-C    Allergies Hydrocodone and Oxycodone    Social History Social History   Tobacco Use  . Smoking status: Never Smoker  . Smokeless tobacco: Never Used  Substance Use Topics  . Alcohol use: No  . Drug use: No    Review of Systems Patient denies headaches, rhinorrhea, blurry vision, numbness, shortness of breath, chest pain, edema, cough, abdominal pain, nausea, vomiting, diarrhea, dysuria, fevers, rashes or hallucinations unless otherwise stated above in HPI. ____________________________________________   PHYSICAL EXAM:  VITAL SIGNS: Vitals:   08/02/17 1430 08/02/17 1500  BP: (!) 125/55 (!) 113/50  Pulse: 67 71  Resp: 20 18  Temp:    SpO2: 100% 99%    Constitutional: Alert and oriented. Well  appearing and in no acute distress. Eyes: Conjunctivae are normal.  Head: Atraumatic. Nose: No congestion/rhinnorhea. Mouth/Throat: Mucous membranes are moist.   Neck: No stridor. Painless ROM.  Cardiovascular: Normal rate, regular rhythm. Grossly normal heart sounds.  Good peripheral circulation. Respiratory: Normal respiratory effort.  No retractions. Lungs CTAB. Gastrointestinal: Soft and nontender. No distention. No abdominal bruits. No CVA tenderness. Genitourinary:  Musculoskeletal: No lower extremity tenderness nor edema.  No joint effusions. Neurologic:  Normal speech and language. No gross focal  neurologic deficits are appreciated. No facial droop Skin:  Skin is warm, dry and intact. No rash noted. Psychiatric: Mood and affect are normal. Speech and behavior are normal.  ____________________________________________   LABS (all labs ordered are listed, but only abnormal results are displayed)  Results for orders placed or performed during the hospital encounter of 08/02/17 (from the past 24 hour(s))  Basic metabolic panel     Status: None   Collection Time: 08/02/17 11:26 AM  Result Value Ref Range   Sodium 140 135 - 145 mmol/L   Potassium 4.3 3.5 - 5.1 mmol/L   Chloride 106 101 - 111 mmol/L   CO2 28 22 - 32 mmol/L   Glucose, Bld 89 65 - 99 mg/dL   BUN 19 6 - 20 mg/dL   Creatinine, Ser 4.09 0.44 - 1.00 mg/dL   Calcium 9.0 8.9 - 81.1 mg/dL   GFR calc non Af Amer >60 >60 mL/min   GFR calc Af Amer >60 >60 mL/min   Anion gap 6 5 - 15  CBC     Status: Abnormal   Collection Time: 08/02/17 11:26 AM  Result Value Ref Range   WBC 3.5 (L) 3.6 - 11.0 K/uL   RBC 4.23 3.80 - 5.20 MIL/uL   Hemoglobin 12.3 12.0 - 16.0 g/dL   HCT 91.4 78.2 - 95.6 %   MCV 88.4 80.0 - 100.0 fL   MCH 29.1 26.0 - 34.0 pg   MCHC 32.9 32.0 - 36.0 g/dL   RDW 21.3 08.6 - 57.8 %   Platelets 183 150 - 440 K/uL  Troponin I     Status: None   Collection Time: 08/02/17 11:26 AM  Result Value Ref Range   Troponin I <0.03 <0.03 ng/mL   ____________________________________________  EKG My review and personal interpretation at Time: 10:53   Indication: palpitations  Rate: 60  Rhythm: v paces Axis: normal Other: normal intervals, no stemi,  ____________________________________________  RADIOLOGY  I personally reviewed all radiographic images ordered to evaluate for the above acute complaints and reviewed radiology reports and findings.  These findings were personally discussed with the patient.  Please see medical record for radiology  report.  ____________________________________________   PROCEDURES  Procedure(s) performed:  Procedures    Critical Care performed: no ____________________________________________   INITIAL IMPRESSION / ASSESSMENT AND PLAN / ED COURSE  Pertinent labs & imaging results that were available during my care of the patient were reviewed by me and considered in my medical decision making (see chart for details).  DDX: Electrolyte abnormality, ACS, dysrhythmia, palpitations, pacemaker malfunction  Teresa Richard is a 77 y.o. who presents to the ED with history of heart block status post pacemaker placement presents with palpitations.  No fast palpitations but seems to be having some sort of symptoms from the pacemaker itself.  EKG shows paced rhythm.  No evidence of ischemia.  Chest x-ray is normal no evidence of lead fracture.  Will interrogate pacer.  Clinical Course as of Aug 02 1624  Wynelle Link Aug 02, 2017  1553 Based on review of the patient's pacer interrogation there does appear to be some lead malfunction.  Will touch base with cardiology regarding need for admission for lead replacement versus transfer.  [PR]  1622 Patient remained stable.  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  Patient in no acute distress.  Spoke with Dr. Marina Goodell shows he does recommend close follow-up.  At this point as she does not have any dysrhythmia no indication for admission for telemetry monitoring.  [PR]    Clinical Course User Index [PR] Willy Eddy, MD     ____________________________________________   FINAL CLINICAL IMPRESSION(S) / ED DIAGNOSES  Final diagnoses:  Palpitations      NEW MEDICATIONS STARTED DURING THIS VISIT:  New Prescriptions   No medications on file     Note:  This document was prepared using Dragon voice recognition software and may include unintentional dictation errors.     Willy Eddy, MD 08/02/17 1626

## 2018-03-23 ENCOUNTER — Other Ambulatory Visit (HOSPITAL_COMMUNITY): Payer: Self-pay | Admitting: Internal Medicine

## 2018-03-23 ENCOUNTER — Other Ambulatory Visit: Payer: Self-pay | Admitting: Internal Medicine

## 2018-03-23 DIAGNOSIS — R413 Other amnesia: Secondary | ICD-10-CM

## 2018-03-23 DIAGNOSIS — Z950 Presence of cardiac pacemaker: Secondary | ICD-10-CM

## 2018-03-29 ENCOUNTER — Other Ambulatory Visit: Payer: Self-pay | Admitting: Internal Medicine

## 2018-03-29 DIAGNOSIS — R413 Other amnesia: Secondary | ICD-10-CM

## 2018-03-29 DIAGNOSIS — Z950 Presence of cardiac pacemaker: Secondary | ICD-10-CM

## 2018-03-30 ENCOUNTER — Ambulatory Visit
Admission: RE | Admit: 2018-03-30 | Discharge: 2018-03-30 | Disposition: A | Discharge status: Home / Self Care | Payer: Medicare Other | Source: Ambulatory Visit | Attending: Internal Medicine | Admitting: Internal Medicine

## 2018-03-30 DIAGNOSIS — R413 Other amnesia: Secondary | ICD-10-CM | POA: Insufficient documentation

## 2018-03-30 DIAGNOSIS — Z950 Presence of cardiac pacemaker: Secondary | ICD-10-CM | POA: Diagnosis present

## 2018-06-01 ENCOUNTER — Encounter: Payer: Self-pay | Admitting: Emergency Medicine

## 2018-06-01 ENCOUNTER — Emergency Department: Payer: Medicare Other

## 2018-06-01 ENCOUNTER — Other Ambulatory Visit: Payer: Self-pay

## 2018-06-01 ENCOUNTER — Emergency Department
Admission: EM | Admit: 2018-06-01 | Discharge: 2018-06-01 | Disposition: A | Discharge status: Home / Self Care | Payer: Medicare Other | Attending: Emergency Medicine | Admitting: Emergency Medicine

## 2018-06-01 DIAGNOSIS — Z950 Presence of cardiac pacemaker: Secondary | ICD-10-CM | POA: Diagnosis not present

## 2018-06-01 DIAGNOSIS — Z79899 Other long term (current) drug therapy: Secondary | ICD-10-CM | POA: Insufficient documentation

## 2018-06-01 DIAGNOSIS — K5900 Constipation, unspecified: Secondary | ICD-10-CM

## 2018-06-01 DIAGNOSIS — Z7982 Long term (current) use of aspirin: Secondary | ICD-10-CM | POA: Diagnosis not present

## 2018-06-01 DIAGNOSIS — E039 Hypothyroidism, unspecified: Secondary | ICD-10-CM | POA: Diagnosis not present

## 2018-06-01 LAB — CBC
HCT: 40.6 % (ref 36.0–46.0)
Hemoglobin: 13.3 g/dL (ref 12.0–15.0)
MCH: 29.8 pg (ref 26.0–34.0)
MCHC: 32.8 g/dL (ref 30.0–36.0)
MCV: 91 fL (ref 80.0–100.0)
Platelets: 170 10*3/uL (ref 150–400)
RBC: 4.46 MIL/uL (ref 3.87–5.11)
RDW: 13.2 % (ref 11.5–15.5)
WBC: 3.9 10*3/uL — AB (ref 4.0–10.5)
nRBC: 0 % (ref 0.0–0.2)

## 2018-06-01 LAB — COMPREHENSIVE METABOLIC PANEL
ALBUMIN: 4.2 g/dL (ref 3.5–5.0)
ALK PHOS: 70 U/L (ref 38–126)
ALT: 14 U/L (ref 0–44)
AST: 16 U/L (ref 15–41)
Anion gap: 5 (ref 5–15)
BILIRUBIN TOTAL: 0.6 mg/dL (ref 0.3–1.2)
BUN: 18 mg/dL (ref 8–23)
CALCIUM: 9.4 mg/dL (ref 8.9–10.3)
CO2: 31 mmol/L (ref 22–32)
CREATININE: 0.67 mg/dL (ref 0.44–1.00)
Chloride: 103 mmol/L (ref 98–111)
GFR calc Af Amer: >60 mL/min (ref >60)
GLUCOSE: 78 mg/dL (ref 70–99)
POTASSIUM: 4.2 mmol/L (ref 3.5–5.1)
Sodium: 139 mmol/L (ref 135–145)
TOTAL PROTEIN: 7.3 g/dL (ref 6.5–8.1)

## 2018-06-01 LAB — LIPASE, BLOOD: Lipase: 37 U/L (ref 11–51)

## 2018-06-01 MED ORDER — MAGNESIUM CITRATE PO SOLN: 0.5000 | Freq: Every day | ORAL | 0 refills | Status: DC | PRN

## 2018-06-01 NOTE — Discharge Instructions (Addendum)
You can continue the MiraLAX as you have been taking.  Take the magnesium citrate as prescribed.  Return to the ER for new, worsening, persistent severe constipation, abdominal pain or swelling, vomiting, or any other new or worsening symptoms that concern you.

## 2018-06-01 NOTE — ED Notes (Signed)
Pt up to bedside toilet. Urinated. Steady.  

## 2018-06-01 NOTE — ED Triage Notes (Signed)
Patient reports last normal bowel movement Monday. Patient states on Friday she started a regimen of colace and miralax 3x a day on Friday. Reports passing two hard pieces of stool yesterday but no significant bowel movement. Patient reports mild abdominal pain since yesterday.

## 2018-06-01 NOTE — ED Provider Notes (Signed)
[facility] Emergency Department Provider Note ____________________________________________   First MD Initiated Contact with Patient 06/01/18 1347     (approximate)  I have reviewed the triage vital signs and the nursing notes.   HISTORY  Chief Complaint Constipation    HPI Teresa Richard is a 77 y.o. female with PMH as noted below who presents with constipation, occurring over the last week.  The patient states she has not had a full bowel movement for about a week although a few days ago did have a small bowel movement in which the stool was hard and required straining.  The patient denies any specific rectal pain or pressure.  She states she had a brief moment of abdominal pain yesterday but this resolved.  She denies any nausea or vomiting.  She denies any recent medication changes.   Past Medical History:  Diagnosis Date  . Hypothyroidism     Patient Active Problem List   Diagnosis Date Noted  . 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 Thyroid disease 01/23/2014  . 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 BREAST CYST EXCISION Left 1980's   negative  . CATARACT EXTRACTION W/PHACO Left 11/11/2016   Procedure: CATARACT EXTRACTION PHACO AND INTRAOCULAR LENS PLACEMENT (IOC);  Surgeon: Galen ManilaPorfilio, William, MD;  Location: [facility] ORS;  Service: Ophthalmology;  Laterality: Left;  US  00:55 AP% 14.7 CDE 8.15 Fluid pack lot # [id number] H  . CTR    . EYE SURGERY    . FRACTURE SURGERY Right    LEG  . INSERT / REPLACE / REMOVE PACEMAKER    . PACEMAKER INSERTION    . RCR      Prior to Admission medications   Medication Sig Start Date End Date Taking? Authorizing Provider  aspirin EC 81 MG tablet Take 81 mg by mouth daily.     [provider]  Cholecalciferol (VITAMIN D3) 1000 units CAPS Take 1,000 Units by mouth daily.     [provider]  cyclobenzaprine (FLEXERIL) 10 MG tablet Take  1 tablet (10 mg total) by mouth 2 (two) times daily as needed. Patient not taking: Reported on 08/02/2017 10/31/16   Joni ReiningSmith, Ronald K, PA-C  levothyroxine (SYNTHROID, LEVOTHROID) 75 MCG tablet Take 75 mcg by mouth every morning.     [provider]  magnesium citrate SOLN Take 148 mLs (0.5 Bottles total) by mouth daily as needed for up to 2 doses for severe constipation. 06/01/18   Dionne BucySiadecki, Claritza July, MD  magnesium oxide (MAG-OX) 400 MG tablet Take 400 mg by mouth daily.     [provider]  traMADol (ULTRAM) 50 MG tablet Take 1 tablet (50 mg total) by mouth every 12 (twelve) hours as needed. Patient not taking: Reported on 08/02/2017 10/31/16   Joni ReiningSmith, Ronald K, PA-C    Allergies Hydrocodone and Oxycodone  Family History  Problem Relation Age of Onset  . Breast cancer Neg Hx     Social History Social History   Tobacco Use  . Smoking status: Never Smoker  . Smokeless tobacco: Never Used  Substance Use Topics  . Alcohol use: No  . Drug use: No    Review of Systems  Constitutional: No fever. Eyes: No redness. ENT: No sore throat. Cardiovascular: Denies chest pain. Respiratory: Denies shortness of breath. Gastrointestinal: No vomiting or diarrhea.  Positive for constipation. Genitourinary: Negative for dysuria.  Musculoskeletal: Negative for back pain. Skin: Negative for rash. Neurological: Negative  for headache.   ____________________________________________   PHYSICAL EXAM:  VITAL SIGNS: ED Triage Vitals [06/01/18 1040]  Enc Vitals Group     BP (!) 147/67     Pulse Rate 65     Resp 16     Temp 98 F (36.7 C)     Temp Source Oral     SpO2 97 %     Weight 110 lb (49.9 kg)     Height 5\' 5"  (1.651 m)     Head Circumference      Peak Flow      Pain Score 1     Pain Loc      Pain Edu?      Excl. in GC?     Constitutional: Alert and oriented. Well appearing and in no acute distress. Eyes: Conjunctivae are normal.  Head: Atraumatic. Nose: No  congestion/rhinnorhea. Mouth/Throat: Mucous membranes are moist.   Neck: Normal range of motion.  Cardiovascular: Good peripheral circulation. Respiratory: Normal respiratory effort.   Gastrointestinal: Soft and nontender. No distention.  No impacted stool or other abnormalities on DRE. Genitourinary: No flank tenderness. Musculoskeletal: Extremities warm and well perfused.  Neurologic:  Normal speech and language. No gross focal neurologic deficits are appreciated.  Skin:  Skin is warm and dry. No rash noted. Psychiatric: Mood and affect are normal. Speech and behavior are normal.  ____________________________________________   LABS (all labs ordered are listed, but only abnormal results are displayed)  Labs Reviewed  CBC - Abnormal; Notable for the following components:      Result Value   WBC 3.9 (*)    All other components within normal limits  LIPASE, BLOOD  COMPREHENSIVE METABOLIC PANEL   ____________________________________________  EKG   ____________________________________________  RADIOLOGY  XR abdomen: Large amount of feces throughout the colon with no evidence of obstruction  ____________________________________________   PROCEDURES  Procedure(s) performed: No  Procedures  Critical Care performed: No ____________________________________________   INITIAL IMPRESSION / ASSESSMENT AND PLAN / ED COURSE  Pertinent labs & imaging results that were available during my care of the patient were reviewed by me and considered in my medical decision making (see chart for details).  77 year old female with PMH as noted above presents with constipation over approximately the last week.  She reports that she had a brief episode of abdominal pain lasting for a few minutes but denies any nausea or vomiting.  She has no abdominal distention.  On exam the patient is well-appearing and her vital signs are normal.  Her abdomen is soft and nontender.  There is no  impacted stool on DRE.  Lab work-up obtained from triage is within normal limits.  Abdominal x-ray shows significant amount of stool but no other acute abnormalities.  Since the patient is not impacted, I will recommend that she take magnesium citrate.  She is stable for discharge home at this time.  I discussed the results of the work-up with her.  Return precautions given, and she expresses understanding.   ____________________________________________   FINAL CLINICAL IMPRESSION(S) / ED DIAGNOSES  Final diagnoses:  Constipation, unspecified constipation type      NEW MEDICATIONS STARTED DURING THIS VISIT:  New Prescriptions   MAGNESIUM CITRATE SOLN    Take 148 mLs (0.5 Bottles total) by mouth daily as needed for up to 2 doses for severe constipation.     Note:  This document was prepared using Dragon voice recognition software and may include unintentional dictation errors.    Dionne BucySiadecki, Londin Antone, MD  06/01/18 1548  

## 2018-06-01 NOTE — ED Notes (Signed)
Sunday morning small hard BM. No BM for week before then.

## 2018-06-23 ENCOUNTER — Other Ambulatory Visit: Payer: Medicare Other

## 2018-06-24 ENCOUNTER — Other Ambulatory Visit: Payer: Self-pay

## 2018-06-24 ENCOUNTER — Encounter
Admission: RE | Admit: 2018-06-24 | Discharge: 2018-06-24 | Disposition: A | Discharge status: Home / Self Care | Payer: Medicare Other | Source: Ambulatory Visit | Attending: Cardiology | Admitting: Cardiology

## 2018-06-24 ENCOUNTER — Ambulatory Visit: Admission: RE | Admit: 2018-06-24 | Payer: Medicare Other | Source: Ambulatory Visit

## 2018-06-24 DIAGNOSIS — Z01818 Encounter for other preprocedural examination: Secondary | ICD-10-CM

## 2018-06-24 DIAGNOSIS — R9431 Abnormal electrocardiogram [ECG] [EKG]: Secondary | ICD-10-CM | POA: Insufficient documentation

## 2018-06-24 DIAGNOSIS — Z01810 Encounter for preprocedural cardiovascular examination: Secondary | ICD-10-CM

## 2018-06-24 HISTORY — DX: Anxiety disorder, unspecified: F41.9

## 2018-06-24 HISTORY — DX: Major depressive disorder, single episode, unspecified: F32.9

## 2018-06-24 HISTORY — DX: Depression, unspecified: F32.A

## 2018-06-24 HISTORY — DX: Anemia, unspecified: D64.9

## 2018-06-24 HISTORY — DX: Presence of cardiac pacemaker: Z95.0

## 2018-06-24 HISTORY — DX: Cardiac arrhythmia, unspecified: I49.9

## 2018-06-24 LAB — APTT: aPTT: 30 seconds (ref 24–36)

## 2018-06-24 LAB — PROTIME-INR
INR: 1.01
PROTHROMBIN TIME: 13.2 s (ref 11.4–15.2)

## 2018-06-24 NOTE — Patient Instructions (Signed)
Your procedure is scheduled on: Wednesday, July 07, 2018  Report to THE SECOND FLOOR OF THE MEDICAL MALL     DO NOT STOP ON THE FIRST FLOOR TO REGISTER  To find out your arrival time please call [phone] between 1PM - 3PM on Tuesday July 06, 2018  Remember: Instructions that are not followed completely may result in serious medical risk,  up to and including death, or upon the discretion of your surgeon and anesthesiologist your  surgery may need to be rescheduled.     _X__ 1. Do not eat food after midnight the night before your procedure.                 No gum chewing or hard candies.                       ABSOLUTELY NOTHING SOLID IN YOUR MOUTH AFTER MIDNIGHT                  You may drink clear liquids up to 2 hours before you are scheduled to arrive for your surgery-                   DO not drink clear liquids within 2 hours of the start of your surgery.                  Clear Liquids include:  water, apple juice without pulp, clear carbohydrate                 drink such as Clearfast of Gatorade, Black Coffee or Tea (Do not add                 anything to coffee or tea).  __X__2.  On the morning of surgery brush your teeth with toothpaste and water,                   You may rinse your mouth with mouthwash if you wish.                          Do not swallow any toothpaste of mouthwash.     _X__ 3.  No Alcohol for 24 hours before or after surgery.   _X__ 4.  Do Not Smoke or use e-cigarettes For 24 Hours Prior to Your Surgery.                 Do not use any chewable tobacco products for at least 6 hours prior to                 surgery.  ____  5.  Bring all medications with you on the day of surgery if instructed.   ____  6.  Notify your doctor if there is any change in your medical condition      (cold, fever, infections).     Do not wear jewelry, make-up, hairpins, clips or nail polish. Do not wear lotions, powders, or perfumes. You may  NOT wear deodorant. Do not shave 48 hours prior to surgery. Men may shave face and neck. Do not bring valuables to the hospital.    [facility] is not responsible for any belongings or valuables.  Contacts, dentures or bridgework may not be worn into surgery. Leave your suitcase in the car. After surgery it may be brought to your room. For patients admitted to the hospital, discharge time is determined by  your treatment team.   Patients discharged the day of surgery will not be allowed to drive home.   Please read over the following fact sheets that you were given:   PREPARING FOR SURGERY  __X__ Take these medicines the morning of surgery with A SIP OF WATER:    1. SYNTHROID  2.   3.   4.  5.  6.  ____ Fleet Enema (as directed)   _X__ Use CHG Soap as directed  ____ Use inhalers on the day of surgery  _X___ Stop ALL ASPIRIN PRODUCTS AS OF June 30, 2018  __X__ Stop Anti-inflammatories AS OF June 30, 2018                    TYLENOL IS ACCEPTABLE AT ANY TIME    ____ Stop supplements until after surgery.    ____ Bring C-Pap to the hospital.   YOU MAY CONTINUE TAKING THE VITAMIN D3 AND MAGNESIUM UP UNTIL THE        DAY OF SURGERY, BUT DO NOT TAKE ON THE MORNING OF SURGERY  WEAR A LOOSE FITTING OR BUTTON DOWN SHIRT TO THE HOSPITAL.

## 2018-06-25 ENCOUNTER — Ambulatory Visit
Admission: RE | Admit: 2018-06-25 | Discharge: 2018-06-25 | Disposition: A | Discharge status: Home / Self Care | Payer: Medicare Other | Source: Ambulatory Visit | Attending: Cardiology | Admitting: Cardiology

## 2018-06-25 ENCOUNTER — Other Ambulatory Visit: Payer: Self-pay | Admitting: Cardiology

## 2018-06-25 DIAGNOSIS — Z01818 Encounter for other preprocedural examination: Secondary | ICD-10-CM | POA: Diagnosis not present

## 2018-07-06 MED ORDER — CEFAZOLIN SODIUM-DEXTROSE 1-4 GM/50ML-% IV SOLN
1.0000 g | Freq: Once | INTRAVENOUS | Status: AC
  Administered 2018-07-07: 1 g via INTRAVENOUS

## 2018-07-07 ENCOUNTER — Other Ambulatory Visit: Payer: Self-pay

## 2018-07-07 ENCOUNTER — Encounter
Admission: RE | Disposition: A | Discharge status: Home / Self Care | Payer: Self-pay | Source: Ambulatory Visit | Attending: Cardiology

## 2018-07-07 ENCOUNTER — Ambulatory Visit: Payer: Medicare Other | Admitting: Anesthesiology

## 2018-07-07 ENCOUNTER — Encounter: Payer: Self-pay | Admitting: Cardiology

## 2018-07-07 ENCOUNTER — Ambulatory Visit
Admission: RE | Admit: 2018-07-07 | Discharge: 2018-07-07 | Disposition: A | Discharge status: Home / Self Care | Payer: Medicare Other | Source: Ambulatory Visit | Attending: Cardiology | Admitting: Cardiology

## 2018-07-07 DIAGNOSIS — Z7982 Long term (current) use of aspirin: Secondary | ICD-10-CM | POA: Insufficient documentation

## 2018-07-07 DIAGNOSIS — E119 Type 2 diabetes mellitus without complications: Secondary | ICD-10-CM | POA: Diagnosis not present

## 2018-07-07 DIAGNOSIS — Z8249 Family history of ischemic heart disease and other diseases of the circulatory system: Secondary | ICD-10-CM | POA: Insufficient documentation

## 2018-07-07 DIAGNOSIS — E079 Disorder of thyroid, unspecified: Secondary | ICD-10-CM | POA: Diagnosis not present

## 2018-07-07 DIAGNOSIS — Z79890 Hormone replacement therapy (postmenopausal): Secondary | ICD-10-CM | POA: Insufficient documentation

## 2018-07-07 DIAGNOSIS — F0390 Unspecified dementia without behavioral disturbance: Secondary | ICD-10-CM | POA: Insufficient documentation

## 2018-07-07 DIAGNOSIS — I442 Atrioventricular block, complete: Secondary | ICD-10-CM | POA: Insufficient documentation

## 2018-07-07 DIAGNOSIS — Z79899 Other long term (current) drug therapy: Secondary | ICD-10-CM | POA: Insufficient documentation

## 2018-07-07 DIAGNOSIS — Z885 Allergy status to narcotic agent status: Secondary | ICD-10-CM | POA: Diagnosis not present

## 2018-07-07 HISTORY — PX: IMPLANTABLE CARDIOVERTER DEFIBRILLATOR (ICD) GENERATOR CHANGE: SHX5469

## 2018-07-07 SURGERY — ICD GENERATOR CHANGE
Anesthesia: General | Site: Chest | Laterality: Left

## 2018-07-07 MED ORDER — FAMOTIDINE 20 MG PO TABS
20.0000 mg | ORAL_TABLET | Freq: Once | ORAL | Status: AC
  Administered 2018-07-07: 20 mg via ORAL

## 2018-07-07 MED ORDER — PHENYLEPHRINE HCL 10 MG/ML IJ SOLN
INTRAMUSCULAR | Status: DC | PRN
  Administered 2018-07-07: 100 ug via INTRAVENOUS
  Administered 2018-07-07: 50 ug via INTRAVENOUS
  Administered 2018-07-07: 100 ug via INTRAVENOUS

## 2018-07-07 MED ORDER — FENTANYL CITRATE (PF) 100 MCG/2ML IJ SOLN
INTRAMUSCULAR | Status: AC
  Filled 2018-07-07: qty 2

## 2018-07-07 MED ORDER — GENTAMICIN SULFATE 40 MG/ML IJ SOLN
INTRAMUSCULAR | Status: AC
  Filled 2018-07-07: qty 2

## 2018-07-07 MED ORDER — LIDOCAINE HCL (CARDIAC) PF 100 MG/5ML IV SOSY
PREFILLED_SYRINGE | INTRAVENOUS | Status: DC | PRN
  Administered 2018-07-07: 30 mg via INTRAVENOUS

## 2018-07-07 MED ORDER — FENTANYL CITRATE (PF) 100 MCG/2ML IJ SOLN
INTRAMUSCULAR | Status: DC | PRN
  Administered 2018-07-07 (×2): 25 ug via INTRAVENOUS

## 2018-07-07 MED ORDER — FENTANYL CITRATE (PF) 100 MCG/2ML IJ SOLN: 25.0000 ug | INTRAMUSCULAR | Status: DC | PRN

## 2018-07-07 MED ORDER — LACTATED RINGERS IV SOLN
INTRAVENOUS | Status: DC
  Administered 2018-07-07: 11:00:00 via INTRAVENOUS

## 2018-07-07 MED ORDER — SODIUM CHLORIDE 0.9 % IV SOLN
Freq: Once | INTRAVENOUS | Status: DC
  Filled 2018-07-07: qty 2

## 2018-07-07 MED ORDER — FAMOTIDINE 20 MG PO TABS
ORAL_TABLET | ORAL | Status: AC
  Administered 2018-07-07: 20 mg via ORAL
  Filled 2018-07-07: qty 1

## 2018-07-07 MED ORDER — PROPOFOL 10 MG/ML IV BOLUS
INTRAVENOUS | Status: AC
  Filled 2018-07-07: qty 20

## 2018-07-07 MED ORDER — SODIUM CHLORIDE (PF) 0.9 % IJ SOLN
INTRAMUSCULAR | Status: AC
  Filled 2018-07-07: qty 50

## 2018-07-07 MED ORDER — LIDOCAINE 1 % OPTIME INJ - NO CHARGE
INTRAMUSCULAR | Status: DC | PRN
  Administered 2018-07-07: 20 mL

## 2018-07-07 MED ORDER — PROPOFOL 10 MG/ML IV BOLUS
INTRAVENOUS | Status: DC | PRN
  Administered 2018-07-07 (×3): 20 ug via INTRAVENOUS
  Administered 2018-07-07: 30 ug via INTRAVENOUS

## 2018-07-07 MED ORDER — CEPHALEXIN 250 MG PO CAPS: 500.0000 mg | ORAL_CAPSULE | Freq: Two times a day (BID) | ORAL | 0 refills | Status: DC

## 2018-07-07 MED ORDER — HEPARIN SODIUM (PORCINE) 5000 UNIT/ML IJ SOLN
INTRAMUSCULAR | Status: AC
  Filled 2018-07-07: qty 1

## 2018-07-07 MED ORDER — CEFAZOLIN SODIUM-DEXTROSE 2-4 GM/100ML-% IV SOLN
INTRAVENOUS | Status: AC
  Filled 2018-07-07: qty 100

## 2018-07-07 MED ORDER — PROPOFOL 500 MG/50ML IV EMUL
INTRAVENOUS | Status: DC | PRN
  Administered 2018-07-07: 40 ug/kg/min via INTRAVENOUS

## 2018-07-07 MED ORDER — SODIUM CHLORIDE 0.9 % IV SOLN
INTRAVENOUS | Status: DC | PRN
  Administered 2018-07-07: 13:00:00

## 2018-07-07 MED ORDER — ONDANSETRON HCL 4 MG/2ML IJ SOLN: 4.0000 mg | Freq: Once | INTRAMUSCULAR | Status: DC | PRN

## 2018-07-07 MED ORDER — ONDANSETRON HCL 4 MG/2ML IJ SOLN
INTRAMUSCULAR | Status: DC | PRN
  Administered 2018-07-07: 4 mg via INTRAVENOUS

## 2018-07-07 SURGICAL SUPPLY — 47 items
BAG DECANTER FOR FLEXI CONT (MISCELLANEOUS) ×3 IMPLANT
BLADE PHOTON ILLUMINATED (MISCELLANEOUS) ×3 IMPLANT
BLADE SURG SZ10 CARB STEEL (BLADE) ×3 IMPLANT
CANISTER SUCT 1200ML W/VALVE (MISCELLANEOUS) ×3 IMPLANT
CHLORAPREP W/TINT 26ML (MISCELLANEOUS) ×3 IMPLANT
CLOSURE WOUND 1/2 X4 (GAUZE/BANDAGES/DRESSINGS) ×1
COVER LIGHT HANDLE STERIS (MISCELLANEOUS) ×6 IMPLANT
COVER WAND RF STERILE (DRAPES) ×3 IMPLANT
DRAPE C-ARM XRAY 36X54 (DRAPES) ×3 IMPLANT
DRAPE INCISE IOBAN 66X45 STRL (DRAPES) ×3 IMPLANT
DRAPE LAPAROTOMY 77X122 PED (DRAPES) ×3 IMPLANT
DRSG TEGADERM 4X4.75 (GAUZE/BANDAGES/DRESSINGS) ×3 IMPLANT
DRSG TEGADERM 6X8 (GAUZE/BANDAGES/DRESSINGS) ×3 IMPLANT
DRSG TELFA 4X3 1S NADH ST (GAUZE/BANDAGES/DRESSINGS) ×3 IMPLANT
ELECT REM PT RETURN 9FT ADLT (ELECTROSURGICAL) ×3
ELECTRODE REM PT RTRN 9FT ADLT (ELECTROSURGICAL) ×1 IMPLANT
GLOVE BIO SURGEON STRL SZ7.5 (GLOVE) ×3 IMPLANT
GLOVE BIO SURGEON STRL SZ8 (GLOVE) ×3 IMPLANT
GOWN STRL REUS W/ TWL LRG LVL3 (GOWN DISPOSABLE) ×1 IMPLANT
GOWN STRL REUS W/ TWL XL LVL3 (GOWN DISPOSABLE) ×1 IMPLANT
GOWN STRL REUS W/TWL LRG LVL3 (GOWN DISPOSABLE) ×2
GOWN STRL REUS W/TWL XL LVL3 (GOWN DISPOSABLE) ×2
GRADUATE 1200CC STRL 31836 (MISCELLANEOUS) ×3 IMPLANT
IPG PACE AZUR XT DR MRI W1DR01 (Pacemaker) ×1 IMPLANT
IV NS 1000ML (IV SOLUTION) ×2
IV NS 1000ML BAXH (IV SOLUTION) ×1 IMPLANT
KIT TURNOVER KIT A (KITS) ×3 IMPLANT
NEEDLE FILTER BLUNT 18X 1/2SAF (NEEDLE) ×2
NEEDLE FILTER BLUNT 18X1 1/2 (NEEDLE) ×1 IMPLANT
NEEDLE HYPO 25X1 1.5 SAFETY (NEEDLE) ×3 IMPLANT
NEEDLE SPNL 22GX3.5 QUINCKE BK (NEEDLE) ×3 IMPLANT
NS IRRIG 500ML POUR BTL (IV SOLUTION) ×3 IMPLANT
PACE AZURE XT DR MRI W1DR01 (Pacemaker) ×3 IMPLANT
PACK BASIN MINOR ARMC (MISCELLANEOUS) ×3 IMPLANT
PACK PACE INSERTION (MISCELLANEOUS) ×3 IMPLANT
PAD STATPAD (MISCELLANEOUS) ×3 IMPLANT
STRAP SAFETY 5IN WIDE (MISCELLANEOUS) ×3 IMPLANT
STRIP CLOSURE SKIN 1/2X4 (GAUZE/BANDAGES/DRESSINGS) ×2 IMPLANT
SUT SILK 2 0 SH (SUTURE) ×3 IMPLANT
SUT VIC AB 2-0 CT1 27 (SUTURE) ×2
SUT VIC AB 2-0 CT1 TAPERPNT 27 (SUTURE) ×1 IMPLANT
SUT VIC AB 2-0 CT2 27 (SUTURE) ×3 IMPLANT
SUT VIC AB 3-0 PS2 18 (SUTURE) ×3 IMPLANT
SUT VIC AB 4-0 PS2 18 (SUTURE) ×3 IMPLANT
SYR 10ML LL (SYRINGE) ×3 IMPLANT
SYR BULB IRRIG 60ML STRL (SYRINGE) ×3 IMPLANT
SYR CONTROL 10ML (SYRINGE) ×3 IMPLANT

## 2018-07-07 NOTE — Discharge Instructions (Signed)
Remove outer bandage on 07/08/2018.  Leave Steri-Strips on.  May shower 07/08/2018, keep Steri-Strips dry.  AMBULATORY SURGERY  DISCHARGE INSTRUCTIONS   1) The drugs that you were given will stay in your system until tomorrow so for the next 24 hours you should not:  A) Drive an automobile B) Make any legal decisions C) Drink any alcoholic beverage   2) You may resume regular meals tomorrow.  Today it is better to start with liquids and gradually work up to solid foods.  You may eat anything you prefer, but it is better to start with liquids, then soup and crackers, and gradually work up to solid foods.   3) Please notify your doctor immediately if you have any unusual bleeding, trouble breathing, redness and pain at the surgery site, drainage, fever, or pain not relieved by medication.    4) Additional Instructions:        Please contact your physician with any problems or Same Day Surgery at [phone], Monday through Friday 6 am to 4 pm, or [place] at [facility] number at [phone].

## 2018-07-07 NOTE — Transfer of Care (Addendum)
Immediate Anesthesia Transfer of Care Note  Patient: Teresa Richard  Procedure(s) Performed: PACER CHANGEOUT (Left Chest)  Patient Location: PACU  Anesthesia Type: General  Level of Consciousness: awake, alert  and oriented   Airway & Oxygen Therapy: Patient Spontanous Breathing and Patient connected to face mask oxygen  Post-op Assessment: Report given to RN and Post -op Vital signs reviewed and stable  Post vital signs: Reviewed and stable  Last Vitals:  Vitals Value Taken Time  BP 125/56 07/07/2018  1:41 PM  Temp    Pulse 63 07/07/2018  1:41 PM  Resp 16 07/07/2018  1:41 PM  SpO2 100 % 07/07/2018  1:41 PM  Vitals shown include unvalidated device data.  Last Pain:  Vitals:   07/07/18 1136  TempSrc: Temporal  PainSc: 0-No pain         Complications: No apparent anesthesia complications

## 2018-07-07 NOTE — Anesthesia Post-op Follow-up Note (Signed)
Anesthesia QCDR form completed.        

## 2018-07-07 NOTE — Anesthesia Preprocedure Evaluation (Signed)
Anesthesia Evaluation  Patient identified by MRN, date of birth, ID band Patient awake    Reviewed: Allergy & Precautions, NPO status , Patient's Chart, lab work & pertinent test results  History of Anesthesia Complications Negative for: history of anesthetic complications  Airway Mallampati: II  TM Distance: >3 FB Neck ROM: Full    Dental  (+) Upper Dentures, Lower Dentures   Pulmonary neg pulmonary ROS, neg sleep apnea, neg COPD,    breath sounds clear to auscultation- rhonchi (-) wheezing      Cardiovascular (-) hypertension(-) CAD, (-) Past MI, (-) Cardiac Stents and (-) CABG + dysrhythmias (complete heart block) + pacemaker  Rhythm:Regular Rate:Normal - Systolic murmurs and - Diastolic murmurs    Neuro/Psych neg Seizures PSYCHIATRIC DISORDERS Anxiety Depression negative neurological ROS     GI/Hepatic negative GI ROS, Neg liver ROS,   Endo/Other  neg diabetesHypothyroidism   Renal/GU negative Renal ROS     Musculoskeletal negative musculoskeletal ROS (+)   Abdominal (+) - obese,   Peds  Hematology  (+) anemia ,   Anesthesia Other Findings Past Medical History: No date: Anemia     Comment:  vitamin b 12 deficiency No date: Anxiety     Comment:  due to memory loss No date: Depression No date: Dysrhythmia No date: Hypothyroidism No date: Presence of permanent cardiac pacemaker     Comment:  complete heart block   Reproductive/Obstetrics                             Anesthesia Physical Anesthesia Plan  ASA: III  Anesthesia Plan: General   Post-op Pain Management:    Induction: Intravenous  PONV Risk Score and Plan: 2 and Propofol infusion  Airway Management Planned: Natural Airway  Additional Equipment:   Intra-op Plan:   Post-operative Plan:   Informed Consent: I have reviewed the patients History and Physical, chart, labs and discussed the procedure including the  risks, benefits and alternatives for the proposed anesthesia with the patient or authorized representative who has indicated his/her understanding and acceptance.     Dental advisory given  Plan Discussed with: CRNA and Anesthesiologist  Anesthesia Plan Comments:         Anesthesia Quick Evaluation

## 2018-07-07 NOTE — Op Note (Signed)
[facility] Cardiology   07/07/2018                     1:48 PM  PATIENT:  Teresa Richard    PRE-OPERATIVE DIAGNOSIS:  3RD DEGREE AV BLOCK  POST-OPERATIVE DIAGNOSIS:  Same  PROCEDURE:  PACER CHANGEOUT  SURGEON:  Marcina Millard, MD    ANESTHESIA:     PREOPERATIVE INDICATIONS:  [name] is a  78 y.o. female with a diagnosis of 3RD DEGREE AV BLOCK who failed conservative measures and elected for surgical management.    The risks benefits and alternatives were discussed with the patient preoperatively including but not limited to the risks of infection, bleeding, cardiopulmonary complications, the need for revision surgery, among others, and the patient was willing to proceed.   OPERATIVE PROCEDURE: Patient was brought to the operating room fasting state.  The left pectoral region was prepped and draped in usual sterile manner.  Anesthesia was then 1% lidocaine locally.  A 6 cm incision was performed of the old pacemaker site.  The existing pacemaker generator was retrieved by electrocautery and blunt dissection.  The leads were disconnected and connected to a new MRI compatible dual-chamber rate responsive pacemaker generator ( Medtronic [id number] ).  The pacemaker pocket was irrigated with gentamicin solution.  The new pacemaker generator was positioned into the pocket and the pocket was closed with 2-0 and 4-0 Vicryl, respectively.  Steri-Strips and pressure dressing were applied.  Postprocedural interrogation revealed appropriate dual-chamber atrial and ventricular sensing and pacing thresholds.  There were no periprocedural complications.

## 2018-07-07 NOTE — Anesthesia Postprocedure Evaluation (Signed)
Anesthesia Post Note  Patient: Teresa Richard  Procedure(s) Performed: Barnabas ListerPACER CHANGEOUT (Left Chest)  Patient location during evaluation: PACU Anesthesia Type: General Level of consciousness: awake and alert and oriented Pain management: pain level controlled Vital Signs Assessment: post-procedure vital signs reviewed and stable Respiratory status: spontaneous breathing, nonlabored ventilation and respiratory function stable Cardiovascular status: blood pressure returned to baseline and stable Postop Assessment: no signs of nausea or vomiting Anesthetic complications: no     Last Vitals:  Vitals:   07/07/18 1430 07/07/18 1446  BP: 132/65 (!) 145/61  Pulse: 61 62  Resp: 16 16  Temp: 36.9 C 36.6 C  SpO2: 97% 100%    Last Pain:  Vitals:   07/07/18 1446  TempSrc: Temporal  PainSc: 0-No pain                 Alexei Ey

## 2018-07-07 NOTE — H&P (Signed)
Jump to Section ? Discontinued MedicationsDocument InformationEncounter DetailsGoalsHistorical MedicationsLast Filed Vital SignsPatient ContactsPatient DemographicsPlan of TreatmentProgress NotesReason for VisitSocial HistoryVisit Diagnoses Norva PavlovLinda Chihuahua Encounter Summary, generated on Feb. 05, 2020February 05, 2020 Printout Information  Document Contents Document Received Date Document Source Organization  [facility] Visit Feb. 05, 2020February 05, 2020 [facility]   Patient Demographics - 78 y.o. Female; born [birth year]  Patient Address Communication Language Race / Ethnicity Marital Status  [address] [place], [place] [address] [phone] [facility](Mobile) [phone] (Home) [email or web address] English (Preferred) White / Not Hispanic or Latino Widowed   Reason for Visit  Reason Comments  Follow-up 6 mos   Encounter Details  Date Type Department Care Team Description  06/09/2018 [facility]  [address]  [place], [place] [phone]  [phone]  Denton ArFath, Kenneth Alan, MD  [address]  [facility] [address] - C[facility]IOLOGY  [place], [place] [address]  [phone]  [phone] (Fax)  Heart block AV third degree (CMS-HCC) (Primary Dx);  H/O cardiac pacemaker;  Dementia without behavioral disturbance, unspecified dementia type (CMS-HCC)   Social History - documented as of this encounter Tobacco Use Types Packs/Day Years Used Date  Never Smoker      Smokeless Tobacco: Never Used      Alcohol Use Drinks/Week oz/Week Comments  No      Sex Assigned at [employer]irth Date Recorded  Not on file    Job Start Date Occupation Industry  Not on file Not on file Not on file   Travel History Travel Start Travel End  No recent travel history available.     Last Filed Vital Signs - documented in this encounter Vital Sign Reading Time Taken Comments  Blood Pressure 126/80 06/09/2018 9:23  AM EST   Pulse 64 06/09/2018 9:23 AM EST   Temperature - -   Respiratory Rate - -   Oxygen Saturation 97% 06/09/2018 9:23 AM EST   Inhaled Oxygen Concentration - -   Weight - -   Height 165.1 cm (5\' 5" ) 06/09/2018 9:23 AM EST   Body Mass Index - -    Progress Notes - documented in this encounter Denton ArFath, Kenneth Alan, MD - 06/09/2018 9:30 AM EST Formatting of this note might be different from the original.   Chief Complaint: Chief Complaint  Patient presents with  . Follow-up  6 mos  Date of Service: 06/09/2018 Date of Birth: 07/04/1940 PCP: Alan MulderHande, Vishwanath Handattur, MD  History of Present Illness: Teresa Richard is a 78 y.o.female patient who returns for follow-up visit. Patient has a Animatort Jude permanent pacemaker in place for complete heart block . Her pacemaker is funcitonal. She is pacer dependent. She has no further palpitations noted. She is unaware of any rapid or irregular heart rhythms. She denies syncope or presyncope. She has not had any further chest pain.[facility]  Past Medical and Surgical History  Past Medical History Past Medical History:  Diagnosis Date  . Heart block AV third degree (CMS-HCC)  . Heart block AV third degree (CMS-HCC)  . Heart disease  . Thyroid disease   Past Surgical History She has a past surgical history that includes Insert / replace / remove pacemaker; Shoulder repair surgery (2005); leg surgery (2005); and carpel tunnel surgery (Right, 2007).   Medications and Allergies  Current Medications  Current Outpatient Medications  Medication Sig Dispense Refill  . aspirin 81 MG EC tablet Take 81 mg by mouth once daily.  . cholecalciferol (VITAMIN  D3) 1,000 unit capsule Take 1,000 Units by mouth once daily.  Marland Kitchen levothyroxine (SYNTHROID, LEVOTHROID) 75 MCG tablet Take 1 tablet (75 mcg total) by mouth once daily Take on an empty stomach with a glass of water at least 30-60 minutes before breakfast. 90 tablet 1  . magnesium citrate  oral solution TAKE 148 ML'S BY MOUTH DAILY AS NEEDED FOR UP TO 2 DOSES FOR SEVERE CONSTIPATION  . magnesium oxide (MAG-OX) 400 mg tablet Take 400 mg by mouth once daily.  . memantine (NAMENDA) 10 MG tablet Take 1 tablet (10 mg total) by mouth 2 (two) times daily 60 tablet 2   No current facility-administered medications for this visit.   Allergies: Hydrocodone and Oxycontin [oxycodone]  Social and Family History  Social History reports that she has never smoked. She has never used smokeless tobacco. She reports that she does not drink alcohol.  Family History Family History  Problem Relation Age of Onset  . Coronary Artery Disease (Blocked arteries around heart) Mother  . Prostate cancer Father  . Coronary Artery Disease (Blocked arteries around heart) Brother   Review of Systems  Review of Systems  Constitutional: Negative for chills, diaphoresis, fever, malaise/fatigue and weight loss.  HENT: Negative for congestion, ear discharge, hearing loss and tinnitus.  Eyes: Negative for blurred vision.  Respiratory: Negative for cough, hemoptysis, sputum production, shortness of breath and wheezing.  Cardiovascular: Negative for orthopnea, claudication, leg swelling and PND.  Gastrointestinal: Negative for abdominal pain, blood in stool, constipation, diarrhea, heartburn, melena, nausea and vomiting.  Genitourinary: Negative for dysuria, frequency, hematuria and urgency.  Musculoskeletal: Negative for back pain, falls, joint pain and myalgias.  Skin: Negative for itching and rash.  Neurological: Negative for dizziness, tingling, focal weakness, loss of consciousness, weakness and headaches.  Endo/Heme/Allergies: Negative for polydipsia. Does not bruise/bleed easily.  Psychiatric/Behavioral: Negative for depression, memory loss and substance abuse. The patient is not nervous/anxious.    Physical Examination   Vitals: BP 126/80  Pulse 64  Ht 165.1 cm (5\' 5" )  LMP (LMP Unknown)  SpO2  97%  BMI 18.47 kg/m  Ht:165.1 cm (5\' 5" ) Wt: LKT:GYBW surface area is 1.52 meters squared. Body mass index is 18.47 kg/m.  Wt Readings from Last 3 Encounters:  04/13/18 50.3 kg (111 lb)  03/12/18 49.6 kg (109 lb 6.4 oz)  01/27/18 50.3 kg (111 lb)   BP Readings from Last 3 Encounters:  06/09/18 126/80  04/13/18 138/68  03/12/18 130/68  general: Female in no acute distress  LUNGS Breath Sounds: Normal Percussion: Normal  CARDIOVASCULAR JVP CV wave: no HJR: no Elevation at 90 degrees: None Carotid Pulse: normal pulsation bilaterally Bruit: None Apex: apical impulse normal  Auscultation Rhythm: normal sinus rhythm and normal pacemaker rhythm S1: normal S2: normal Clicks: no Rub: no Murmurs: no murmurs  Gallop: None  EXTREMITIES Clubbing: no Edema: trace to 1+ bilateral pedal edema Pulses: peripheral pulses symmetrical Femoral Bruits: no Amputation: no SKIN Rash: no Cyanosis: no Embolic phemonenon: no Bruising: no NEURO Alert and Oriented to person, place and time: yes Non focal: yes  Assessment and Plan   78 y.o. female with  ICD-10-CM ICD-9-CM  1. Heart block AV third degree-pacemaker is functioning normally however is at [facility]. Will schedule generator change out. Pt is pacer dependent. I44.2 426.0  2. Thyroid disease-being followed by her primary care provider will continue to closely follow. E07.9 246.9   3. Chest pain-has improved with nonsteroidals. Currently stable.   Return in about 4 weeks (  around 07/07/2018).  These notes generated with voice recognition software. I apologize for typographical errors.  Denton Ar, MD    Electronically signed by Denton Ar, MD at 06/09/2018 12:21 PM EST   Plan of Treatment - documented as of this encounter Upcoming Encounters Upcoming Encounters  Date Type Specialty Care Team Description  07/13/2018 Office Visit Internal Medicine Alan Mulder, MD  [address]    [place], [place] [address]  [phone]  [phone]    10/29/2018 Office Visit Neurology Jolene Provost, MD  [phone] [facility] [address]  [facility]  [place], [place] [address]  [phone]  [phone] ([address]    Harlin Rain [place], [place]  [address]  [place], [place] [address]  [phone]  [phone] (Fax)     Goals - documented as of this encounter Goal Patient Goal Type Associated Problems Recent Progress Patient-Stated? Author  Maintain health/healthy lifestyle  Lifestyle  On track (03/12/2018 9:49 AM EDT) Yes Larae Grooms, Autumn, RN   Visit Diagnoses - documented in this encounter Diagnosis  Heart block AV third degree (CMS-HCC) - Primary  Atrioventricular block, complete   H/O cardiac pacemaker   Dementia without behavioral disturbance, unspecified dementia type (CMS-HCC)    Discontinued Medications - documented as of this encounter Medication Sig Discontinue Reason Start Date End Date  amLODIPine (NORVASC) 2.5 MG tablet  amlodipine 2.5 mg tablet Alternate therapy  06/09/2018  cyanocobalamin, vitamin B-12, (VITAMIN B-12) 500 mcg TbER  Take 1 tablet by mouth once daily. Alternate therapy  06/09/2018  sertraline (ZOLOFT) 25 MG tablet  Take 1 tablet (25 mg total) by mouth once daily Therapy completed 04/13/2018 06/09/2018   Historical Medications - added in this encounter Reconcile with Patient's Chart This list may reflect changes made after this encounter.  Medication Sig Dispensed Refills Start Date End Date  magnesium citrate oral solution  TAKE 148 ML'S BY MOUTH DAILY AS NEEDED FOR UP TO 2 DOSES FOR SEVERE CONSTIPATION  0 06/01/2018 06/16/2018  amLODIPine (NORVASC) 2.5 MG tablet  amlodipine 2.5 mg tablet  0  06/09/2018  Images  Patient Contacts  Contact Name Contact Address Communication Relationship to Patient  Merdis Delay Unknown [phone] [facility]) [phone] (Home)  Son or Daughter, Emergency Contact  Document Information  Primary Care Provider Other Service Providers Document Coverage Dates  Alan Mulder, MD (Dec. 08, 2016December 08, 2016 - Present) DM: [phone] (Work) [phone] (Fax) [address] [facility] [place], [place] [address] Internal Medicine [facility] [address] [place], [place] [address]  Jan. 08, 2020January 08, 2020   Custodian Organization  [facility] [address] [place], [place] [address]   Encounter Providers Encounter Date  Denton Ar, MD (Attending) DM: [phone] (Work) [phone] (Fax) [phone] [facility] [address] [employer] [facility] - CARDIOLOGY [place], [place] [address] Cardiovascular Disease Jan. 08, 2020January 08, 2020    Show All Sections

## 2018-07-07 NOTE — Interval H&P Note (Signed)
History and Physical Interval Note:  07/07/2018 11:06 AM  Teresa Richard  has presented today for surgery, with the diagnosis of 3RD DEGREE AV BLOCK  The various methods of treatment have been discussed with the patient and family. After consideration of risks, benefits and other options for treatment, the patient has consented to  Procedure(s): PACER CHANGEOUT (Left) as a surgical intervention .  The patient's history has been reviewed, patient examined, no change in status, stable for surgery.  I have reviewed the patient's chart and labs.  Questions were answered to the patient's satisfaction.     Analisa Sledd [employer]

## 2018-07-26 ENCOUNTER — Ambulatory Visit: Payer: Medicare Other | Admitting: Speech Pathology

## 2018-07-28 ENCOUNTER — Ambulatory Visit: Payer: Medicare Other | Admitting: Speech Pathology

## 2018-08-02 ENCOUNTER — Ambulatory Visit: Payer: Medicare Other | Admitting: Speech Pathology

## 2018-08-04 ENCOUNTER — Ambulatory Visit: Payer: Medicare Other | Admitting: Speech Pathology

## 2018-08-09 ENCOUNTER — Ambulatory Visit: Payer: Medicare Other | Admitting: Speech Pathology

## 2018-08-11 ENCOUNTER — Encounter: Payer: Medicare Other | Admitting: Speech Pathology

## 2018-08-16 ENCOUNTER — Encounter: Payer: Medicare Other | Admitting: Speech Pathology

## 2018-08-18 ENCOUNTER — Encounter: Payer: Medicare Other | Admitting: Speech Pathology

## 2018-08-23 ENCOUNTER — Encounter: Payer: Medicare Other | Admitting: Speech Pathology

## 2018-08-25 ENCOUNTER — Encounter: Payer: Medicare Other | Admitting: Speech Pathology

## 2018-08-30 ENCOUNTER — Encounter: Payer: Medicare Other | Admitting: Speech Pathology

## 2018-09-01 ENCOUNTER — Encounter: Payer: Medicare Other | Admitting: Speech Pathology

## 2018-09-06 ENCOUNTER — Encounter: Payer: Medicare Other | Admitting: Speech Pathology

## 2018-09-08 ENCOUNTER — Encounter: Payer: Medicare Other | Admitting: Speech Pathology

## 2018-09-09 ENCOUNTER — Other Ambulatory Visit: Payer: Self-pay

## 2018-09-09 ENCOUNTER — Encounter: Payer: Self-pay | Admitting: Emergency Medicine

## 2018-09-09 ENCOUNTER — Ambulatory Visit
Admission: EM | Admit: 2018-09-09 | Discharge: 2018-09-09 | Disposition: A | Discharge status: Home / Self Care | Payer: Medicare Other | Attending: Family Medicine | Admitting: Family Medicine

## 2018-09-09 DIAGNOSIS — E039 Hypothyroidism, unspecified: Secondary | ICD-10-CM | POA: Insufficient documentation

## 2018-09-09 DIAGNOSIS — Z950 Presence of cardiac pacemaker: Secondary | ICD-10-CM | POA: Insufficient documentation

## 2018-09-09 DIAGNOSIS — F0390 Unspecified dementia without behavioral disturbance: Secondary | ICD-10-CM | POA: Diagnosis not present

## 2018-09-09 DIAGNOSIS — Z79899 Other long term (current) drug therapy: Secondary | ICD-10-CM | POA: Insufficient documentation

## 2018-09-09 DIAGNOSIS — Z7982 Long term (current) use of aspirin: Secondary | ICD-10-CM | POA: Diagnosis not present

## 2018-09-09 DIAGNOSIS — R350 Frequency of micturition: Secondary | ICD-10-CM | POA: Insufficient documentation

## 2018-09-09 DIAGNOSIS — Z885 Allergy status to narcotic agent status: Secondary | ICD-10-CM | POA: Diagnosis not present

## 2018-09-09 DIAGNOSIS — Z79890 Hormone replacement therapy (postmenopausal): Secondary | ICD-10-CM | POA: Insufficient documentation

## 2018-09-09 DIAGNOSIS — R441 Visual hallucinations: Secondary | ICD-10-CM | POA: Diagnosis present

## 2018-09-09 LAB — URINALYSIS, COMPLETE (UACMP) WITH MICROSCOPIC
Bilirubin Urine: NEGATIVE
Glucose, UA: NEGATIVE mg/dL
Ketones, ur: NEGATIVE mg/dL
Nitrite: NEGATIVE
Protein, ur: NEGATIVE mg/dL
Specific Gravity, Urine: 1.02 (ref 1.005–1.030)
pH: 5 (ref 5.0–8.0)

## 2018-09-09 NOTE — ED Provider Notes (Signed)
[facility]    CSN: [id number] Arrival date & time: 09/09/18  1931  History   Chief Complaint Chief Complaint  Patient presents with  . Altered Mental Status   HPI  78 year old female presents for evaluation of urinary frequency and hallucinations.  History is provided predominantly by the daughter as patient has dementia. Patient reports that she has had increased frequency of urination for the past 3 to 4 weeks.  Daughter states that as a result she limits her fluid intake.  Daughter reports that over the past 2 weeks she has had some hallucinations.  These seem to improve but occurred again 2 nights ago.  She was called regarding hallucinations.  Reportedly patient hallucinates that someone is trying to enter her home through the attic.  No fever.  No abdominal pain.  No respiratory symptoms.  No other associated symptoms.  No other complaints.  PMH, Surgical Hx, Family Hx, Social History reviewed and updated as below.  Past Medical History:  Diagnosis Date  . 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 Anxiety    due to memory loss  . Depression   . Dysrhythmia   . Hypothyroidism   . 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 Thyroid disease 01/23/2014  . 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 BREAST CYST EXCISION Left 1980's   negative  . CATARACT EXTRACTION W/PHACO Left 11/11/2016   Procedure: CATARACT EXTRACTION PHACO AND INTRAOCULAR LENS PLACEMENT (IOC);  Surgeon: Galen ManilaPorfilio, William, MD;  Location: [facility] ORS;  Service: Ophthalmology;  Laterality: Left;  US  00:55 AP% 14.7 CDE 8.15 Fluid pack lot # [id number]  . CTR Right   . EYE SURGERY Bilateral    cataract extractions  . FRACTURE SURGERY Right 2005   LEG. nuts and bolts in knee and foot  . IMPLANTABLE CARDIOVERTER DEFIBRILLATOR  (ICD) GENERATOR CHANGE Left 07/07/2018   Procedure: PACER CHANGEOUT;  Surgeon: Marcina MillardParaschos, Alexander, MD;  Location: [facility] ORS;  Service: Cardiovascular;  Laterality: Left;  . INSERT / REPLACE / REMOVE PACEMAKER    . PACEMAKER INSERTION    . RCR Left 2008    OB History   No obstetric history on file.      Home Medications    Prior to Admission medications   Medication Sig Start Date End Date Taking? Authorizing Provider  aspirin EC 81 MG tablet Take 81 mg by mouth daily.    Yes [provider]  Cholecalciferol (VITAMIN D3) 1000 units CAPS Take 1,000 Units by mouth daily.    Yes [provider]  Cyanocobalamin (VITAMIN B-12 IJ) Inject 1 application as directed every 4 (four) months. Dr. Marcello FennelHande oversees injection. Patient had last dose in 10/19   Yes [provider]  levothyroxine (SYNTHROID, LEVOTHROID) 75 MCG tablet Take 75 mcg by mouth every morning.    Yes [provider]    Family History Family History  Problem Relation Age of Onset  . Breast cancer Neg Hx     Social History Social History   Tobacco Use  . Smoking status: Never Smoker  . Smokeless tobacco: Never Used  Substance Use Topics  . Alcohol use: No  . Drug use: No     Allergies   Hydrocodone and Oxycodone   Review of Systems Review of  Systems  Constitutional: Negative.   Respiratory: Negative.   Genitourinary: Positive for frequency.  Psychiatric/Behavioral: Positive for hallucinations.   Physical Exam Triage Vital Signs ED Triage Vitals  Enc Vitals Group     BP 09/09/18 1940 (!) 145/73     Pulse --      Resp 09/09/18 1940 18     Temp 09/09/18 1940 98.2 F (36.8 C)     Temp Source 09/09/18 1940 Oral     SpO2 09/09/18 1940 99 %     Weight 09/09/18 1937 110 lb (49.9 kg)     Height 09/09/18 1937 5\' 4"  (1.626 m)     Head Circumference --      Peak Flow --      Pain Score 09/09/18 1937 0     Pain Loc --      Pain Edu? --      Excl. in GC? --    Updated  Vital Signs BP (!) 145/73 (BP Location: Right Arm)   Temp 98.2 F (36.8 C) (Oral)   Resp 18   Ht 5\' 4"  (1.626 m)   Wt 49.9 kg   SpO2 99%   BMI 18.88 kg/m   Visual Acuity Right Eye Distance:   Left Eye Distance:   Bilateral Distance:    Right Eye Near:   Left Eye Near:    Bilateral Near:     Physical Exam Vitals signs and nursing note reviewed.  Constitutional:      General: She is not in acute distress.    Appearance: Normal appearance.  HENT:     Head: Normocephalic and atraumatic.  Eyes:     General:        Right eye: No discharge.        Left eye: No discharge.     Conjunctiva/sclera: Conjunctivae normal.  Cardiovascular:     Rate and Rhythm: Normal rate and regular rhythm.  Pulmonary:     Effort: Pulmonary effort is normal.     Breath sounds: Normal breath sounds.  Abdominal:     General: There is no distension.     Palpations: Abdomen is soft.     Tenderness: There is no abdominal tenderness.  Neurological:     Mental Status: She is alert.  Psychiatric:        Mood and Affect: Mood normal.        Behavior: Behavior normal.    UC Treatments / Results  Labs (all labs ordered are listed, but only abnormal results are displayed) Labs Reviewed  URINALYSIS, COMPLETE (UACMP) WITH MICROSCOPIC - Abnormal; Notable for the following components:      Result Value   APPearance HAZY (*)    Hgb urine dipstick TRACE (*)    Leukocytes,Ua SMALL (*)    Bacteria, UA RARE (*)    All other components within normal limits  URINE CULTURE    EKG None  Radiology No results found.  Procedures Procedures (including critical care time)  Medications Ordered in UC Medications - No data to display  Initial Impression / Assessment and Plan / UC Course  I have reviewed the triage vital signs and the nursing notes.  Pertinent labs & imaging results that were available during my care of the patient were reviewed by me and considered in my medical decision making (see  chart for details).    78 year old female presents for evaluation of urinary frequency and visual hallucinations.  Her exam is benign.  Her urine is unremarkable.  Sending culture.  Patient has been prescribed Seroquel.  I advised the daughter to consider this as discussed previously with her physician.  Final Clinical Impressions(s) / UC Diagnoses   Final diagnoses:  Visual hallucinations     Discharge Instructions     No evidence of UTI.   Needs PCP follow up.   Can consider seroquel at night.  Dr. Adriana Simas     ED Prescriptions    None     Controlled Substance Prescriptions [place] Controlled Substance Registry consulted? Not Applicable   Tommie Sams, DO 09/09/18 2008

## 2018-09-09 NOTE — ED Triage Notes (Signed)
Daughter states patient has been hallucinating x 2 nights. Patient also has dementia. She reports that patient is not drinking water and only drinks coffee. Daughter states patient is not having any urinary symptoms.

## 2018-09-09 NOTE — Discharge Instructions (Addendum)
No evidence of UTI.   Needs PCP follow up.   Can consider seroquel at night.  Dr. Adriana Simas

## 2018-09-10 LAB — URINE CULTURE

## 2018-09-13 ENCOUNTER — Encounter: Payer: Medicare Other | Admitting: Speech Pathology

## 2018-09-15 ENCOUNTER — Encounter: Payer: Medicare Other | Admitting: Speech Pathology

## 2018-10-27 ENCOUNTER — Emergency Department
Admission: EM | Admit: 2018-10-27 | Discharge: 2018-10-27 | Disposition: A | Discharge status: Home / Self Care | Payer: Medicare Other | Attending: Emergency Medicine | Admitting: Emergency Medicine

## 2018-10-27 ENCOUNTER — Encounter: Payer: Self-pay | Admitting: Medical Oncology

## 2018-10-27 ENCOUNTER — Emergency Department: Payer: Medicare Other

## 2018-10-27 DIAGNOSIS — Z950 Presence of cardiac pacemaker: Secondary | ICD-10-CM | POA: Insufficient documentation

## 2018-10-27 DIAGNOSIS — R079 Chest pain, unspecified: Secondary | ICD-10-CM | POA: Diagnosis present

## 2018-10-27 DIAGNOSIS — Z79899 Other long term (current) drug therapy: Secondary | ICD-10-CM | POA: Insufficient documentation

## 2018-10-27 DIAGNOSIS — Z1159 Encounter for screening for other viral diseases: Secondary | ICD-10-CM | POA: Insufficient documentation

## 2018-10-27 DIAGNOSIS — Z7982 Long term (current) use of aspirin: Secondary | ICD-10-CM | POA: Insufficient documentation

## 2018-10-27 DIAGNOSIS — E039 Hypothyroidism, unspecified: Secondary | ICD-10-CM | POA: Diagnosis not present

## 2018-10-27 DIAGNOSIS — R0602 Shortness of breath: Secondary | ICD-10-CM | POA: Insufficient documentation

## 2018-10-27 DIAGNOSIS — R0789 Other chest pain: Secondary | ICD-10-CM

## 2018-10-27 LAB — CBC WITH DIFFERENTIAL/PLATELET
Abs Immature Granulocytes: 0.03 10*3/uL (ref 0.00–0.07)
Basophils Absolute: 0 10*3/uL (ref 0.0–0.1)
Basophils Relative: 1 %
Eosinophils Absolute: 0 10*3/uL (ref 0.0–0.5)
Eosinophils Relative: 0 %
HCT: 37.9 % (ref 36.0–46.0)
Hemoglobin: 12.2 g/dL (ref 12.0–15.0)
Immature Granulocytes: 1 %
Lymphocytes Relative: 9 %
Lymphs Abs: 0.5 10*3/uL — ABNORMAL LOW (ref 0.7–4.0)
MCH: 29.5 pg (ref 26.0–34.0)
MCHC: 32.2 g/dL (ref 30.0–36.0)
MCV: 91.5 fL (ref 80.0–100.0)
Monocytes Absolute: 0.6 10*3/uL (ref 0.1–1.0)
Monocytes Relative: 12 %
Neutro Abs: 4.2 10*3/uL (ref 1.7–7.7)
Neutrophils Relative %: 77 %
Platelets: 159 10*3/uL (ref 150–400)
RBC: 4.14 MIL/uL (ref 3.87–5.11)
RDW: 13.1 % (ref 11.5–15.5)
WBC: 5.5 10*3/uL (ref 4.0–10.5)
nRBC: 0 % (ref 0.0–0.2)

## 2018-10-27 LAB — COMPREHENSIVE METABOLIC PANEL
ALT: 12 U/L (ref 0–44)
AST: 18 U/L (ref 15–41)
Albumin: 3.9 g/dL (ref 3.5–5.0)
Alkaline Phosphatase: 70 U/L (ref 38–126)
Anion gap: 6 (ref 5–15)
BUN: 22 mg/dL (ref 8–23)
CO2: 30 mmol/L (ref 22–32)
Calcium: 9.4 mg/dL (ref 8.9–10.3)
Chloride: 103 mmol/L (ref 98–111)
Creatinine, Ser: 0.73 mg/dL (ref 0.44–1.00)
GFR calc Af Amer: >60 mL/min (ref >60)
GFR calc non Af Amer: >60 mL/min (ref >60)
Glucose, Bld: 134 mg/dL — ABNORMAL HIGH (ref 70–99)
Potassium: 4.2 mmol/L (ref 3.5–5.1)
Sodium: 139 mmol/L (ref 135–145)
Total Bilirubin: 1.2 mg/dL (ref 0.3–1.2)
Total Protein: 6.7 g/dL (ref 6.5–8.1)

## 2018-10-27 LAB — LIPASE, BLOOD: Lipase: 25 U/L (ref 11–51)

## 2018-10-27 LAB — TROPONIN I: Troponin I: <0.03 ng/mL (ref <0.03)

## 2018-10-27 LAB — SARS CORONAVIRUS 2 BY RT PCR (HOSPITAL ORDER, PERFORMED IN ~~LOC~~ HOSPITAL LAB): SARS Coronavirus 2: NEGATIVE

## 2018-10-27 LAB — FIBRIN DERIVATIVES D-DIMER (ARMC ONLY): Fibrin derivatives D-dimer (ARMC): 1021.8 ng/mL (FEU) — ABNORMAL HIGH (ref 0.00–499.00)

## 2018-10-27 MED ORDER — ACETAMINOPHEN 325 MG PO TABS
650.0000 mg | ORAL_TABLET | Freq: Once | ORAL | Status: AC
  Administered 2018-10-27: 11:00:00 650 mg via ORAL
  Filled 2018-10-27: qty 2

## 2018-10-27 MED ORDER — IOHEXOL 350 MG/ML SOLN
75.0000 mL | Freq: Once | INTRAVENOUS | Status: AC | PRN
  Administered 2018-10-27: 75 mL via INTRAVENOUS

## 2018-10-27 NOTE — ED Notes (Signed)
Pt states her chest hurts 10/10 pain across her chest right to left, states it's taking her breath away, Dr aware, pt denies cough, states she doesn't like to be alone with the door shut, explained until we get covid results the door has to be shut. O2 per [place] applied for comfort, sats 97 on RA, 99% on 2L. NAD. Will continue to monitor.

## 2018-10-27 NOTE — Discharge Instructions (Addendum)
Your labs and CT scan of the chest are okay today. Continue taking the ibuprofen as directed by your doctor and follow up with them next week.

## 2018-10-27 NOTE — ED Triage Notes (Signed)
Pt reports tightness to chest that began last night- pt reports pain worsens with deep breaths. Pt denies sob. Reports slight sore throat. Denies fever or cough.

## 2018-10-27 NOTE — ED Provider Notes (Signed)
[facility] Emergency Department Provider Note  ____________________________________________  Time seen: Approximately 10:07 AM  I have reviewed the triage vital signs and the nursing notes.   HISTORY  Chief Complaint Chest Pain  Level 5 Caveat: Portions of the History and Physical including HPI and review of systems are unable to be completely obtained due to patient being a poor historian due to chronic disorientation to time   HPI Teresa Richard is a 78 y.o. 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  It is constant, described  as tightness and heaviness.  Associated with shortness of breath, starts in the left chest and radiates to the right.  No vomiting or diaphoresis.  No arm pain or back pain.  No dizziness or syncope.  No palpitations.  Denies having pain like this in the past.  Not exertional.  Feels worse with deep inspiration.  Review of electronic medical record shows that the patient was evaluated in primary care clinic yesterday for similar symptoms which at that time had been reported to be ongoing for 3 or 4 days.  They also noted no exertional symptoms and suspected costochondritis and sent the patient home to try NSAIDs.  They further note that the patient had reported that she has been having symptoms like this in the mornings for the past 2 weeks.   Past Medical History:  Diagnosis Date  . 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 Anxiety    due to memory loss  . Depression   . Dysrhythmia   . Hypothyroidism   . 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 Thyroid disease 01/23/2014  . 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 BREAST CYST  EXCISION Left 1980's   negative  . CATARACT EXTRACTION W/PHACO Left 11/11/2016   Procedure: CATARACT EXTRACTION PHACO AND INTRAOCULAR LENS PLACEMENT (IOC);  Surgeon: Galen ManilaPorfilio, William, MD;  Location: [facility] ORS;  Service: Ophthalmology;  Laterality: Left;  US  00:55 AP% 14.7 CDE 8.15 Fluid pack lot # [id number]  . CTR Right   . EYE SURGERY Bilateral    cataract extractions  . FRACTURE SURGERY Right 2005   LEG. nuts and bolts in knee and foot  . IMPLANTABLE CARDIOVERTER DEFIBRILLATOR (ICD) GENERATOR CHANGE Left 07/07/2018   Procedure: PACER CHANGEOUT;  Surgeon: Marcina MillardParaschos, Alexander, MD;  Location: [facility] ORS;  Service: Cardiovascular;  Laterality: Left;  . INSERT / REPLACE / REMOVE PACEMAKER    . PACEMAKER INSERTION    . RCR Left 2008     Prior to Admission medications   Medication Sig Start Date End Date Taking? Authorizing Provider  aspirin EC 81 MG tablet Take 81 mg by mouth daily.     [provider]  Cholecalciferol (VITAMIN D3) 1000 units CAPS Take 1,000 Units by mouth daily.     [provider]  Cyanocobalamin (VITAMIN B-12 IJ) Inject 1 application as directed every 4 (four) months. Dr. Marcello FennelHande oversees injection. Patient had last dose in 10/19    [provider]  levothyroxine (SYNTHROID, LEVOTHROID) 75 MCG tablet Take 75 mcg by mouth every morning.     [provider]     Allergies  Hydrocodone and Oxycodone   Family History  Problem Relation Age of Onset  . Breast cancer Neg Hx     Social History Social History   Tobacco Use  . Smoking status: Never Smoker  . Smokeless tobacco: Never Used  Substance Use Topics  . Alcohol use: No  . Drug use: No    Review of Systems  Constitutional:   No fever or chills.  ENT:   No sore throat. No rhinorrhea. Cardiovascular: Positive as above chest pain without syncope. Respiratory:   No dyspnea or cough. Gastrointestinal:   Negative for abdominal pain, vomiting and diarrhea.  Musculoskeletal:    Negative for focal pain or swelling All other systems reviewed and are negative except as documented above in ROS and HPI.  ____________________________________________   PHYSICAL EXAM:  VITAL SIGNS: ED Triage Vitals [10/27/18 0921]  Enc Vitals Group     BP (!) 130/106     Pulse Rate 75     Resp 20     Temp 98.3 F (36.8 C)     Temp Source Oral     SpO2 97 %     Weight 118 lb (53.5 kg)     Height  (1.626 m)     Head Circumference      Peak Flow      Pain Score 7     Pain Loc      Pain Edu?      Excl. in GC?     Vital signs reviewed, nursing assessments reviewed.   Constitutional:   Alert and oriented to person and place. Non-toxic appearance. Eyes:   Conjunctivae are normal. EOMI. PERRL. ENT      Head:   Normocephalic and atraumatic.      Nose:   No congestion/rhinnorhea.       Mouth/Throat:   MMM, no pharyngeal erythema. No peritonsillar mass.       Neck:   No meningismus. Full ROM. Hematological/Lymphatic/Immunilogical:   No cervical lymphadenopathy. Cardiovascular:   RRR. Symmetric bilateral radial and DP pulses.  No murmurs. Cap refill less than 2 seconds. Respiratory:   Normal respiratory effort without tachypnea/retractions. Breath sounds are clear and equal bilaterally. No wheezes/rales/rhonchi. Gastrointestinal:   Soft and nontender. Non distended. There is no CVA tenderness.  No rebound, rigidity, or guarding. Musculoskeletal:   Normal range of motion in all extremities. No joint effusions.  No lower extremity tenderness.  No edema. Neurologic:   Normal speech and language.  Motor grossly intact. No acute focal neurologic deficits are appreciated.  Skin:    Skin is warm, dry and intact. No rash noted.  No petechiae, purpura, or bullae.  ____________________________________________    LABS (pertinent positives/negatives) (all labs ordered are listed, but only abnormal results are displayed) Labs Reviewed  COMPREHENSIVE METABOLIC PANEL - Abnormal;  Notable for the following components:      Result Value   Glucose, Bld 134 (*)    All other components within normal limits  CBC WITH DIFFERENTIAL/PLATELET - Abnormal; Notable for the following components:   Lymphs Abs 0.5 (*)    All other components within normal limits  FIBRIN DERIVATIVES D-DIMER ([facility] ONLY) - Abnormal; Notable for the following components:   Fibrin derivatives D-dimer ([facility]) 1,021.80 (*)    All other components within normal limits  SARS CORONAVIRUS 2 (HOSPITAL ORDER, PERFORMED IN  HOSPITAL LAB)  LIPASE, BLOOD  TROPONIN I   ____________________________________________   EKG  Interpreted by me Ventricular paced rhythm, rate of 77.  Left axis, left bundle branch block.  No acute ischemic changes.  ____________________________________________    RADIOLOGY  Ct Angio Chest Pe W And/or Wo Contrast  Result Date: 10/27/2018 CLINICAL DATA:  Chest tightness. Positive D-dimer. Rule out pulmonary embolism. EXAM: CT ANGIOGRAPHY CHEST WITH CONTRAST TECHNIQUE: Multidetector CT imaging of the chest was performed using the standard protocol during bolus administration of intravenous contrast. Multiplanar CT image reconstructions and MIPs were obtained to evaluate the vascular anatomy. CONTRAST:  32mL OMNIPAQUE IOHEXOL 350 MG/ML SOLN COMPARISON:  Chest x-ray 10/27/2018 FINDINGS: Cardiovascular: Negative for pulmonary embolism. Pulmonary arteries normal in caliber. Minimal atherosclerotic calcification aortic arch without aneurysm or dissection. Heart size normal. No significant coronary calcification. Pacemaker noted. Mediastinum/Nodes: Negative for mass or adenopathy Lungs/Pleura: Lungs are clear without infiltrate or effusion. Mild subpleural scarring in the upper lobes. Scattered small air-filled cysts in the lungs bilaterally. Negative for mass lesion. Upper Abdomen: No acute abnormality. Musculoskeletal: No acute skeletal abnormality. Review of the MIP images confirms  the above findings. IMPRESSION: Negative for pulmonary embolism. No acute abnormality in the chest. Mild subpleural scarring bilaterally. Electronically Signed   By: Marlan Palau M.D.   On: 10/27/2018 12:39   Dg Chest Portable 1 View  Result Date: 10/27/2018 CLINICAL DATA:  Chest tightness EXAM: PORTABLE CHEST 1 VIEW COMPARISON:  06/25/2018 FINDINGS: Low lung volumes with vascular crowding. Mild right lower lobe opacity, likely atelectasis. No pleural effusion or pneumothorax. The heart is normal in size.  Left subclavian pacemaker. IMPRESSION: No evidence of acute cardiopulmonary disease. Electronically Signed   By: Charline Bills M.D.   On: 10/27/2018 09:48    ____________________________________________   PROCEDURES Procedures  ____________________________________________  DIFFERENTIAL DIAGNOSIS   Chest wall pain, GERD, pneumonia, COVID infection, low risk for pulmonary embolism.  Doubt ACS or dissection or myocarditis.  CLINICAL IMPRESSION / ASSESSMENT AND PLAN / ED COURSE  Medications ordered in the ED: Medications  acetaminophen (TYLENOL) tablet 650 mg (650 mg Oral Given 10/27/18 1037)  iohexol (OMNIPAQUE) 350 MG/ML injection 75 mL (75 mLs Intravenous Contrast Given 10/27/18 1224)    Pertinent labs & imaging results that were available during my care of the patient were reviewed by me and considered in my medical decision making (see chart for details).  Teresa Richard was evaluated in Emergency Department on 10/27/2018 for the symptoms described in the history of present illness. She was evaluated in the context of the global COVID-19 pandemic, which necessitated consideration that the patient might be at risk for infection with the SARS-CoV-2 virus that causes COVID-19. Institutional protocols and algorithms that pertain to the evaluation of patients at risk for COVID-19 are in a state of rapid change based on information released by regulatory bodies including the CDC and  federal and state organizations. These policies and algorithms were followed during the patient's care in the ED.   Patient presents with pleuritic chest pain, subacute to chronic in nature although the patient describes it as starting this morning.  She seems to be having intermittent daily episodes.  EKG is unrevealing.  Will check labs including troponin (single is sufficient with pain going on at least a day if not 2 weeks), d-dimer, chest x-ray.   ----------------------------------------- 1:48 PM on 10/27/2018 ----------------------------------------- D-dimer was elevated, CT scan obtained which is unremarkable.  Results discussed with the patient's daughter, Teresa Richard, who agrees with discharge home, will continue ibuprofen and follow-up with primary care.      ____________________________________________   FINAL CLINICAL IMPRESSION(S) / ED DIAGNOSES  Final diagnoses:  Atypical chest pain     ED Discharge Orders    None      Portions of this note were generated with dragon dictation software. Dictation errors may occur despite best attempts at proofreading.   Sharman Cheek, MD 10/27/18 1349

## 2018-10-27 NOTE — ED Notes (Signed)
Pt resting in bed, no new complaints at this time. Bed locked and low, call light in reach, Xray at bedside.

## 2018-10-27 NOTE — ED Notes (Signed)
Heather, charge RN spoke with pts daughter, explained what care we have already provided and that we will call her back once we have results and know what will be done next. Daughter appreciative of information. [phone].

## 2018-10-27 NOTE — ED Notes (Signed)
Patient transported to CT 

## 2018-11-11 ENCOUNTER — Other Ambulatory Visit (HOSPITAL_COMMUNITY): Payer: Self-pay | Admitting: Neurology

## 2018-11-11 ENCOUNTER — Other Ambulatory Visit: Payer: Self-pay | Admitting: Neurology

## 2018-11-11 DIAGNOSIS — R413 Other amnesia: Secondary | ICD-10-CM

## 2018-11-15 ENCOUNTER — Other Ambulatory Visit: Payer: Self-pay

## 2018-11-15 ENCOUNTER — Ambulatory Visit
Admission: RE | Admit: 2018-11-15 | Discharge: 2018-11-15 | Disposition: A | Discharge status: Home / Self Care | Payer: Medicare Other | Source: Ambulatory Visit | Attending: Neurology | Admitting: Neurology

## 2018-11-15 DIAGNOSIS — R413 Other amnesia: Secondary | ICD-10-CM | POA: Insufficient documentation

## 2019-02-09 ENCOUNTER — Observation Stay
Admission: EM | Admit: 2019-02-09 | Discharge: 2019-02-09 | Disposition: A | Discharge status: Other Acute Inpatient Hospital | Payer: Medicare Other | Attending: Internal Medicine | Admitting: Internal Medicine

## 2019-02-09 ENCOUNTER — Other Ambulatory Visit: Payer: Self-pay

## 2019-02-09 ENCOUNTER — Inpatient Hospital Stay (HOSPITAL_COMMUNITY)
Admission: AD | Admit: 2019-02-09 | Discharge: 2019-02-17 | DRG: 177 | Disposition: A | Discharge status: Skilled Nursing Facility | Payer: Medicare Other | Source: Other Acute Inpatient Hospital | Attending: Family Medicine | Admitting: Family Medicine

## 2019-02-09 DIAGNOSIS — Z751 Person awaiting admission to adequate facility elsewhere: Secondary | ICD-10-CM | POA: Diagnosis not present

## 2019-02-09 DIAGNOSIS — Z79890 Hormone replacement therapy (postmenopausal): Secondary | ICD-10-CM | POA: Insufficient documentation

## 2019-02-09 DIAGNOSIS — I442 Atrioventricular block, complete: Secondary | ICD-10-CM | POA: Insufficient documentation

## 2019-02-09 DIAGNOSIS — Z9841 Cataract extraction status, right eye: Secondary | ICD-10-CM | POA: Diagnosis not present

## 2019-02-09 DIAGNOSIS — E860 Dehydration: Secondary | ICD-10-CM | POA: Diagnosis not present

## 2019-02-09 DIAGNOSIS — E039 Hypothyroidism, unspecified: Secondary | ICD-10-CM | POA: Diagnosis present

## 2019-02-09 DIAGNOSIS — Z781 Physical restraint status: Secondary | ICD-10-CM | POA: Diagnosis not present

## 2019-02-09 DIAGNOSIS — G92 Toxic encephalopathy: Secondary | ICD-10-CM | POA: Diagnosis not present

## 2019-02-09 DIAGNOSIS — R55 Syncope and collapse: Principal | ICD-10-CM | POA: Diagnosis present

## 2019-02-09 DIAGNOSIS — F0390 Unspecified dementia without behavioral disturbance: Secondary | ICD-10-CM | POA: Insufficient documentation

## 2019-02-09 DIAGNOSIS — E876 Hypokalemia: Secondary | ICD-10-CM | POA: Diagnosis not present

## 2019-02-09 DIAGNOSIS — R451 Restlessness and agitation: Secondary | ICD-10-CM | POA: Diagnosis present

## 2019-02-09 DIAGNOSIS — Z681 Body mass index (BMI) 19 or less, adult: Secondary | ICD-10-CM | POA: Diagnosis not present

## 2019-02-09 DIAGNOSIS — Z950 Presence of cardiac pacemaker: Secondary | ICD-10-CM

## 2019-02-09 DIAGNOSIS — R531 Weakness: Secondary | ICD-10-CM | POA: Diagnosis not present

## 2019-02-09 DIAGNOSIS — R410 Disorientation, unspecified: Secondary | ICD-10-CM | POA: Diagnosis not present

## 2019-02-09 DIAGNOSIS — Z885 Allergy status to narcotic agent status: Secondary | ICD-10-CM

## 2019-02-09 DIAGNOSIS — E43 Unspecified severe protein-calorie malnutrition: Secondary | ICD-10-CM | POA: Diagnosis present

## 2019-02-09 DIAGNOSIS — D509 Iron deficiency anemia, unspecified: Secondary | ICD-10-CM | POA: Insufficient documentation

## 2019-02-09 DIAGNOSIS — D519 Vitamin B12 deficiency anemia, unspecified: Secondary | ICD-10-CM | POA: Diagnosis present

## 2019-02-09 DIAGNOSIS — D61818 Other pancytopenia: Secondary | ICD-10-CM | POA: Diagnosis present

## 2019-02-09 DIAGNOSIS — F419 Anxiety disorder, unspecified: Secondary | ICD-10-CM | POA: Diagnosis not present

## 2019-02-09 DIAGNOSIS — R4020 Unspecified coma: Secondary | ICD-10-CM

## 2019-02-09 DIAGNOSIS — Z961 Presence of intraocular lens: Secondary | ICD-10-CM | POA: Diagnosis not present

## 2019-02-09 DIAGNOSIS — U071 COVID-19: Principal | ICD-10-CM | POA: Diagnosis present

## 2019-02-09 DIAGNOSIS — Z66 Do not resuscitate: Secondary | ICD-10-CM | POA: Diagnosis present

## 2019-02-09 DIAGNOSIS — Z9842 Cataract extraction status, left eye: Secondary | ICD-10-CM | POA: Diagnosis not present

## 2019-02-09 DIAGNOSIS — Z7982 Long term (current) use of aspirin: Secondary | ICD-10-CM | POA: Insufficient documentation

## 2019-02-09 DIAGNOSIS — F329 Major depressive disorder, single episode, unspecified: Secondary | ICD-10-CM | POA: Diagnosis present

## 2019-02-09 DIAGNOSIS — Z79899 Other long term (current) drug therapy: Secondary | ICD-10-CM | POA: Insufficient documentation

## 2019-02-09 DIAGNOSIS — E079 Disorder of thyroid, unspecified: Secondary | ICD-10-CM | POA: Diagnosis present

## 2019-02-09 HISTORY — DX: Unspecified dementia, unspecified severity, without behavioral disturbance, psychotic disturbance, mood disturbance, and anxiety: F03.90

## 2019-02-09 LAB — FERRITIN: Ferritin: 103 ng/mL (ref 11–307)

## 2019-02-09 LAB — BASIC METABOLIC PANEL
Anion gap: 7 (ref 5–15)
BUN: 22 mg/dL (ref 8–23)
CO2: 26 mmol/L (ref 22–32)
Calcium: 7.6 mg/dL — ABNORMAL LOW (ref 8.9–10.3)
Chloride: 107 mmol/L (ref 98–111)
Creatinine, Ser: 0.72 mg/dL (ref 0.44–1.00)
GFR calc Af Amer: >60 mL/min (ref >60)
GFR calc non Af Amer: >60 mL/min (ref >60)
Glucose, Bld: 97 mg/dL (ref 70–99)
Potassium: 3.4 mmol/L — ABNORMAL LOW (ref 3.5–5.1)
Sodium: 140 mmol/L (ref 135–145)

## 2019-02-09 LAB — HEPATIC FUNCTION PANEL
ALT: 27 U/L (ref 0–44)
AST: 33 U/L (ref 15–41)
Albumin: 3.1 g/dL — ABNORMAL LOW (ref 3.5–5.0)
Alkaline Phosphatase: 76 U/L (ref 38–126)
Bilirubin, Direct: <0.1 mg/dL (ref 0.0–0.2)
Total Bilirubin: 0.3 mg/dL (ref 0.3–1.2)
Total Protein: 5.2 g/dL — ABNORMAL LOW (ref 6.5–8.1)

## 2019-02-09 LAB — CBC WITH DIFFERENTIAL/PLATELET
Abs Immature Granulocytes: 0.01 10*3/uL (ref 0.00–0.07)
Basophils Absolute: 0 10*3/uL (ref 0.0–0.1)
Basophils Relative: 1 %
Eosinophils Absolute: 0 10*3/uL (ref 0.0–0.5)
Eosinophils Relative: 1 %
HCT: 30.7 % — ABNORMAL LOW (ref 36.0–46.0)
Hemoglobin: 9.8 g/dL — ABNORMAL LOW (ref 12.0–15.0)
Immature Granulocytes: 1 %
Lymphocytes Relative: 27 %
Lymphs Abs: 0.5 10*3/uL — ABNORMAL LOW (ref 0.7–4.0)
MCH: 29.2 pg (ref 26.0–34.0)
MCHC: 31.9 g/dL (ref 30.0–36.0)
MCV: 91.4 fL (ref 80.0–100.0)
Monocytes Absolute: 0.4 10*3/uL (ref 0.1–1.0)
Monocytes Relative: 21 %
Neutro Abs: 0.9 10*3/uL — ABNORMAL LOW (ref 1.7–7.7)
Neutrophils Relative %: 49 %
Platelets: 112 10*3/uL — ABNORMAL LOW (ref 150–400)
RBC: 3.36 MIL/uL — ABNORMAL LOW (ref 3.87–5.11)
RDW: 14.2 % (ref 11.5–15.5)
Smear Review: NORMAL
WBC: 1.7 10*3/uL — ABNORMAL LOW (ref 4.0–10.5)
nRBC: 0 % (ref 0.0–0.2)

## 2019-02-09 LAB — CBC
HCT: 31 % — ABNORMAL LOW (ref 36.0–46.0)
Hemoglobin: 9.9 g/dL — ABNORMAL LOW (ref 12.0–15.0)
MCH: 29.5 pg (ref 26.0–34.0)
MCHC: 31.9 g/dL (ref 30.0–36.0)
MCV: 92.3 fL (ref 80.0–100.0)
Platelets: 111 10*3/uL — ABNORMAL LOW (ref 150–400)
RBC: 3.36 MIL/uL — ABNORMAL LOW (ref 3.87–5.11)
RDW: 14.3 % (ref 11.5–15.5)
WBC: 1.7 10*3/uL — ABNORMAL LOW (ref 4.0–10.5)
nRBC: 0 % (ref 0.0–0.2)

## 2019-02-09 LAB — URINALYSIS, COMPLETE (UACMP) WITH MICROSCOPIC
Bilirubin Urine: NEGATIVE
Glucose, UA: NEGATIVE mg/dL
Hgb urine dipstick: NEGATIVE
Ketones, ur: NEGATIVE mg/dL
Leukocytes,Ua: NEGATIVE
Nitrite: NEGATIVE
Protein, ur: NEGATIVE mg/dL
Specific Gravity, Urine: 1.012 (ref 1.005–1.030)
pH: 6 (ref 5.0–8.0)

## 2019-02-09 LAB — RETICULOCYTES
Immature Retic Fract: 1 % — ABNORMAL LOW (ref 2.3–15.9)
RBC.: 3.38 MIL/uL — ABNORMAL LOW (ref 3.87–5.11)
Retic Count, Absolute: 15.5 10*3/uL — ABNORMAL LOW (ref 19.0–186.0)
Retic Ct Pct: 0.5 % (ref 0.4–3.1)

## 2019-02-09 LAB — MRSA PCR SCREENING: MRSA by PCR: NEGATIVE

## 2019-02-09 LAB — IRON AND TIBC
Iron: 19 ug/dL — ABNORMAL LOW (ref 28–170)
Saturation Ratios: 9 % — ABNORMAL LOW (ref 10.4–31.8)
TIBC: 202 ug/dL — ABNORMAL LOW (ref 250–450)
UIBC: 183 ug/dL

## 2019-02-09 LAB — LACTATE DEHYDROGENASE: LDH: 132 U/L (ref 98–192)

## 2019-02-09 LAB — TROPONIN I (HIGH SENSITIVITY): Troponin I (High Sensitivity): 5 ng/L (ref <18)

## 2019-02-09 LAB — URIC ACID: Uric Acid, Serum: 4.1 mg/dL (ref 2.5–7.1)

## 2019-02-09 LAB — SARS CORONAVIRUS 2 BY RT PCR (HOSPITAL ORDER, PERFORMED IN ~~LOC~~ HOSPITAL LAB): SARS Coronavirus 2: POSITIVE — AB

## 2019-02-09 LAB — PATHOLOGIST SMEAR REVIEW

## 2019-02-09 MED ORDER — ADULT MULTIVITAMIN W/MINERALS CH
1.0000 | ORAL_TABLET | Freq: Every day | ORAL | Status: DC
  Administered 2019-02-10 – 2019-02-17 (×6): 1 via ORAL
  Filled 2019-02-09 (×7): qty 1

## 2019-02-09 MED ORDER — BUSPIRONE HCL 5 MG PO TABS
5.0000 mg | ORAL_TABLET | Freq: Two times a day (BID) | ORAL | Status: DC
  Administered 2019-02-09 – 2019-02-17 (×14): 5 mg via ORAL
  Filled 2019-02-09 (×18): qty 1

## 2019-02-09 MED ORDER — ASPIRIN EC 81 MG PO TBEC
81.0000 mg | DELAYED_RELEASE_TABLET | Freq: Every day | ORAL | Status: DC
  Administered 2019-02-10 – 2019-02-17 (×6): 81 mg via ORAL
  Filled 2019-02-09 (×7): qty 1

## 2019-02-09 MED ORDER — SODIUM CHLORIDE 0.9 % IV SOLN
INTRAVENOUS | Status: AC
  Administered 2019-02-09 – 2019-02-10 (×2): via INTRAVENOUS

## 2019-02-09 MED ORDER — LEVOTHYROXINE SODIUM 75 MCG PO TABS
75.0000 ug | ORAL_TABLET | ORAL | Status: DC
  Administered 2019-02-10 – 2019-02-17 (×8): 75 ug via ORAL
  Filled 2019-02-09 (×8): qty 1

## 2019-02-09 MED ORDER — QUETIAPINE FUMARATE 25 MG PO TABS
50.0000 mg | ORAL_TABLET | ORAL | Status: DC
  Administered 2019-02-09 – 2019-02-17 (×21): 50 mg via ORAL
  Filled 2019-02-09 (×22): qty 2

## 2019-02-09 MED ORDER — ACETAMINOPHEN 325 MG PO TABS
650.0000 mg | ORAL_TABLET | Freq: Four times a day (QID) | ORAL | Status: DC | PRN
  Administered 2019-02-13: 650 mg via ORAL
  Filled 2019-02-09: qty 2

## 2019-02-09 MED ORDER — LORAZEPAM 2 MG/ML IJ SOLN
0.5000 mg | Freq: Four times a day (QID) | INTRAMUSCULAR | Status: DC | PRN
  Administered 2019-02-09 – 2019-02-11 (×4): 1 mg via INTRAVENOUS
  Filled 2019-02-09 (×5): qty 1

## 2019-02-09 MED ORDER — SENNA 8.6 MG PO TABS
1.0000 | ORAL_TABLET | Freq: Two times a day (BID) | ORAL | Status: DC
  Administered 2019-02-09 – 2019-02-17 (×13): 8.6 mg via ORAL
  Filled 2019-02-09 (×15): qty 1

## 2019-02-09 MED ORDER — QUETIAPINE FUMARATE 25 MG PO TABS
50.0000 mg | ORAL_TABLET | Freq: Every day | ORAL | Status: DC
  Administered 2019-02-09: 50 mg via ORAL

## 2019-02-09 MED ORDER — ENOXAPARIN SODIUM 40 MG/0.4ML ~~LOC~~ SOLN
40.0000 mg | SUBCUTANEOUS | Status: DC
  Administered 2019-02-09 – 2019-02-16 (×8): 40 mg via SUBCUTANEOUS
  Filled 2019-02-09 (×8): qty 0.4

## 2019-02-09 MED ORDER — SODIUM CHLORIDE 0.9 % IV SOLN
510.0000 mg | Freq: Once | INTRAVENOUS | Status: AC
  Administered 2019-02-09: 14:00:00 510 mg via INTRAVENOUS
  Filled 2019-02-09: qty 17

## 2019-02-09 MED ORDER — MELATONIN 5 MG PO TABS
5.0000 mg | ORAL_TABLET | Freq: Every day | ORAL | Status: DC
  Administered 2019-02-09 – 2019-02-16 (×7): 5 mg via ORAL
  Filled 2019-02-09 (×9): qty 1

## 2019-02-09 MED ORDER — SODIUM CHLORIDE 0.9 % IV BOLUS
500.0000 mL | Freq: Once | INTRAVENOUS | Status: AC
  Administered 2019-02-09: 500 mL via INTRAVENOUS

## 2019-02-09 MED ORDER — MAGNESIUM OXIDE 400 MG PO TABS
400.0000 mg | ORAL_TABLET | Freq: Every day | ORAL | Status: DC
  Administered 2019-02-10 – 2019-02-17 (×6): 400 mg via ORAL
  Filled 2019-02-09 (×16): qty 1

## 2019-02-09 NOTE — H&P (Addendum)
[facility] [place] [facility]   PATIENT NAME: Teresa Richard Steinmiller    MR#:  [id number]  DATE OF BIRTH:  02/08/1941  DATE OF ADMISSION:  02/09/2019  PRIMARY CARE PHYSICIAN: Barbette ReichmannHande, Vishwanath, MD   REQUESTING/REFERRING PHYSICIAN: dr Mort Sawyersmonk  CHIEF COMPLAINT:   syncope HISTORY OF PRESENT ILLNESS:  Teresa Richard Guillotte  is a 78 y.o. female with a known history of anemia and hypothyroid who presented to the ER due to apparently patient had a syncopal event in her chair.  She did not fall.  She has had generalized weakness for the past week. Labs revealed mild hypokalemia with iron deficiency anemia.  She has history of pacemaker due to complete heart block that was changed in February.  PAST MEDICAL HISTORY:   Past Medical History:  Diagnosis Date  . 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 Anxiety    due to memory loss  . Depression   . Dysrhythmia   . Hypothyroidism   . Presence of permanent cardiac pacemaker    complete heart block    PAST SURGICAL HISTORY:   Past Surgical History:  Procedure Laterality Date  . BREAST CYST EXCISION Left 1980's   negative  . CATARACT EXTRACTION W/PHACO Left 11/11/2016   Procedure: CATARACT EXTRACTION PHACO AND INTRAOCULAR LENS PLACEMENT (IOC);  Surgeon: Galen ManilaPorfilio, William, MD;  Location: [facility] ORS;  Service: Ophthalmology;  Laterality: Left;  US  00:55 AP% 14.7 CDE 8.15 Fluid pack lot # [id number]  . CTR Right   . EYE SURGERY Bilateral    cataract extractions  . FRACTURE SURGERY Right 2005   LEG. nuts and bolts in knee and foot  . IMPLANTABLE CARDIOVERTER DEFIBRILLATOR (ICD) GENERATOR CHANGE Left 07/07/2018   Procedure: PACER CHANGEOUT;  Surgeon: Marcina MillardParaschos, Alexander, MD;  Location: [facility] ORS;  Service: Cardiovascular;  Laterality: Left;  . INSERT / REPLACE / REMOVE PACEMAKER    . PACEMAKER INSERTION    . RCR Left 2008    SOCIAL HISTORY:   Social History   Tobacco Use  . Smoking status: Never Smoker  . Smokeless tobacco: Never Used   Substance Use Topics  . Alcohol use: No    FAMILY HISTORY:   Family History  Problem Relation Age of Onset  . Breast cancer Neg Hx     DRUG ALLERGIES:   Allergies  Allergen Reactions  . Hydrocodone Other (See Comments)    Feels like she is floating  . Oxycodone Other (See Comments)    Feels like she is floating    REVIEW OF SYSTEMS:   Review of Systems  Constitutional: Negative.  Negative for chills, fever and malaise/fatigue.  HENT: Negative.  Negative for ear discharge, ear pain, hearing loss, nosebleeds and sore throat.   Eyes: Negative.  Negative for blurred vision and pain.  Respiratory: Negative.  Negative for cough, hemoptysis, shortness of breath and wheezing.   Cardiovascular: Negative.  Negative for chest pain, palpitations and leg swelling.  Gastrointestinal: Negative.  Negative for abdominal pain, blood in stool, diarrhea, nausea and vomiting.  Genitourinary: Negative.  Negative for dysuria.  Musculoskeletal: Negative.  Negative for back pain.  Skin: Negative.   Neurological: Negative for dizziness, tremors, speech change, focal weakness, seizures and headaches.  Endo/Heme/Allergies: Negative.  Does not bruise/bleed easily.  Psychiatric/Behavioral: Negative.  Negative for depression, hallucinations and suicidal ideas.    MEDICATIONS AT HOME:   Prior to Admission medications   Medication Sig Start Date End Date Taking? Authorizing Provider  aspirin EC 81  MG tablet Take 81 mg by mouth daily.     [provider]  Cholecalciferol (VITAMIN D3) 1000 units CAPS Take 1,000 Units by mouth daily.     [provider]  Cyanocobalamin (VITAMIN B-12 IJ) Inject 1 application as directed every 4 (four) months. Dr. Marcello Fennel oversees injection. Patient had last dose in 10/19    [provider]  levothyroxine (SYNTHROID, LEVOTHROID) 75 MCG tablet Take 75 mcg by mouth every morning.     [provider]      VITAL SIGNS:  Blood pressure (!)  104/52, pulse (!) 59, temperature 98.3 F (36.8 C), temperature source Oral, resp. rate 18, height 5\' 3"  (1.6 m), weight 47.2 kg, SpO2 100 %.  PHYSICAL EXAMINATION:   Physical Exam Constitutional:      General: She is not in acute distress. HENT:     Head: Normocephalic.     Mouth/Throat:     Mouth: Mucous membranes are moist.  Eyes:     General: No scleral icterus. Neck:     Musculoskeletal: Normal range of motion and neck supple.     Vascular: No JVD.     Trachea: No tracheal deviation.  Cardiovascular:     Rate and Rhythm: Normal rate and regular rhythm.     Heart sounds: Normal heart sounds. No murmur. No friction rub. No gallop.   Pulmonary:     Effort: Pulmonary effort is normal. No respiratory distress.     Breath sounds: Normal breath sounds. No wheezing or rales.  Chest:     Chest wall: No tenderness.  Abdominal:     General: Bowel sounds are normal. There is no distension.     Palpations: Abdomen is soft. There is no mass.     Tenderness: There is no abdominal tenderness. There is no guarding or rebound.  Musculoskeletal: Normal range of motion.  Skin:    General: Skin is warm.     Findings: No erythema or rash.  Neurological:     General: No focal deficit present.     Mental Status: She is alert and oriented to person, place, and time. Mental status is at baseline.     Cranial Nerves: Cranial nerve deficit present.  Psychiatric:        Judgment: Judgment normal.       LABORATORY PANEL:   CBC Recent Labs  Lab 02/09/19 1002  WBC 1.7*  HGB 9.9*  HCT 31.0*  PLT 111*   ------------------------------------------------------------------------------------------------------------------  Chemistries  Recent Labs  Lab 02/09/19 1002  NA 140  K 3.4*  CL 107  CO2 26  GLUCOSE 97  BUN 22  CREATININE 0.72  CALCIUM 7.6*  AST 33  ALT 27  ALKPHOS 76  BILITOT 0.3    ------------------------------------------------------------------------------------------------------------------  Cardiac Enzymes No results for input(s): TROPONINI in the last 168 hours. ------------------------------------------------------------------------------------------------------------------  RADIOLOGY:  No results found.  EKG:   paced  IMPRESSION AND PLAN:   78 year old female with history of complete heart block status post pacemaker and hypothyroidism who presents to the emergency room after syncopal episode.  1.  Syncope:  Continue telemetry monitoring Troponins x3 Echocardiogram and carotid Dopplers ordered Cardiology consultation placed via epic given history of pacemaker. Check orthostatics Continue IV fluids  2.  Mild hypokalemia: Replete and recheck in a.m.  3.  Iron deficiency anemia: Anemia panel ordered in the emergency room which is consistent with iron deficiency IV iron ordered  4.  Hypothyroidism: Continue Synthroid  PT and clinical social work consult  placed.  All the records are reviewed and case discussed with ED provider. Management plans discussed with the patient and she in agreement  CODE STATUS: dnr TOTAL TIME TAKING CARE OF THIS PATIENT: 41 minutes.    Bettey Costa M.D on 02/09/2019 at 12:13 PM  Between 7am to 6pm - Pager - [phone]  After 6pm go to [email or web address] - password [email or web address]  Office  [phone]  CC: Primary care physician; Tracie Harrier, MD

## 2019-02-09 NOTE — Progress Notes (Addendum)
Patient continued to attempt to pull off gown, monitor, and get out of bed. Pt toileted and had a large bowel movement. When assisted back to bed patient appeared much calmer, will trial ankle restraints off at this time. Took nightly PO meds with no issue. IVF initiated once patient more calm.   Spoke with daughter in length about mother's baseline status. She states she resides in a memory care unit and is ambulatory in room. Daughter endorses violent visual hallucinations, agitation, and combativeness. Daughter aware patient is currently restrained and understands it is for her mother's and staff safety. Agreeable to plan of care and appreciated the update.

## 2019-02-09 NOTE — ED Notes (Signed)
Daughter concerned about bp 99/48- adjusted bp cuff to get a pressure of 104/52

## 2019-02-09 NOTE — ED Notes (Signed)
Report given to Sam, RN

## 2019-02-09 NOTE — Progress Notes (Signed)
Family Meeting Note  Advance Directive:yes  Today a meeting took place with the Patient.daughter  The following clinical team members were present during this meeting:MD  The following were discussed:Patient's diagnosis:syncope Hx CHB pacer, Patient's progosis: > 12 months and Goals for treatment: DNR  Additional follow-up to be provided: no changes needs DNR in chart  Time spent during discussion:16 minutes  Bettey Costa, MD

## 2019-02-09 NOTE — ED Notes (Signed)
[facility] at bedside 

## 2019-02-09 NOTE — ED Notes (Signed)
MD notified of pt covid results

## 2019-02-09 NOTE — Progress Notes (Signed)
Patient arrived on the unit, combative and trying to leave. Paged MD for orders. Patient received PRN ativan and was restrained. Attempted to call daughter with an update but there was no answer.  Will continue to monitor

## 2019-02-09 NOTE — ED Notes (Signed)
Called [facility] and left message with Director Alma Downs of [place] [facility] concerning pt's COVID status. Daughter states she will attempt to call as well.

## 2019-02-09 NOTE — ED Notes (Signed)
Pt changed and repositioned at this time.

## 2019-02-09 NOTE — ED Notes (Signed)
Daughter at bedside.

## 2019-02-09 NOTE — ED Notes (Signed)
Called lab and spoke with [place]. She stated send down dark green and lavender tube for test.

## 2019-02-09 NOTE — ED Triage Notes (Signed)
Pt arrives via EMS after having a witnessed syncopal episode in a chair- no fall- LOC lasted about 1 minute- per facility pt complains of having weakness x1 week- no other complaints at this time

## 2019-02-09 NOTE — ED Notes (Signed)
Dr. Monks at bedside 

## 2019-02-09 NOTE — ED Provider Notes (Signed)
[facility] Emergency Department Provider Note  ____________________________________________   First MD Initiated Contact with Patient 02/09/19 1001     (approximate)  I have reviewed the triage vital signs and the nursing notes.  History  Chief Complaint Loss of Consciousness    HPI Teresa Richard is a 78 y.o. female history of B12 deficient anemia, heart block status post pacemaker, hypothyroidism, dementia who presents the emergency department from her ALF for generalized weakness x 1 week, and an episode of witnessed syncope today.  Patient states for 1 week she has been feeling generally weak.  Today she was talking at the nurses station at her facility, when she had a witnessed episode of loss of consciousness.  Staff were able to catch her and lowered her to the ground.  There was no associated trauma.  She regained consciousness after approximately 1 minute.  No described shaking or seizure-like activity.  No history of seizures.  Patient denies any preceding palpitations, chest pain, headache, vision changes, weakness, numbness, or tingling.  She denies any dysuria or hematuria.  Denies any dark or bloody stools..  No respiratory symptoms, fevers, chills, sick contacts.  No vomiting or diarrhea.         Past Medical Hx Past Medical History:  Diagnosis Date  . 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 Anxiety    due to memory loss  . Depression   . Dysrhythmia   . Hypothyroidism   . Presence of permanent cardiac pacemaker    complete heart block    Problem List Patient Active Problem List   Diagnosis Date Noted  . 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 Thyroid disease 01/23/2014  . Heart block AV third degree ([place]) 11/11/2013    Past Surgical Hx Past Surgical History:  Procedure Laterality Date  . BREAST CYST EXCISION Left 1980's   negative  . CATARACT EXTRACTION W/PHACO Left 11/11/2016   Procedure: CATARACT EXTRACTION PHACO AND  INTRAOCULAR LENS PLACEMENT (IOC);  Surgeon: Birder Robson, MD;  Location: [facility] ORS;  Service: Ophthalmology;  Laterality: Left;  [place]  00:55 AP% 14.7 CDE 8.15 Fluid pack lot # [id number]  . CTR Right   . EYE SURGERY Bilateral    cataract extractions  . FRACTURE SURGERY Right 2005   LEG. nuts and bolts in knee and foot  . IMPLANTABLE CARDIOVERTER DEFIBRILLATOR (ICD) GENERATOR CHANGE Left 07/07/2018   Procedure: PACER CHANGEOUT;  Surgeon: Isaias Cowman, MD;  Location: [facility] ORS;  Service: Cardiovascular;  Laterality: Left;  . INSERT / REPLACE / REMOVE PACEMAKER    . PACEMAKER INSERTION    . RCR Left 2008    Medications Prior to Admission medications   Medication Sig Start Date End Date Taking? Authorizing Provider  aspirin EC 81 MG tablet Take 81 mg by mouth daily.     [provider]  Cholecalciferol (VITAMIN D3) 1000 units CAPS Take 1,000 Units by mouth daily.     [provider]  Cyanocobalamin (VITAMIN B-12 IJ) Inject 1 application as directed every 4 (four) months. Dr. Ginette Pitman oversees injection. Patient had last dose in 10/19    [provider]  levothyroxine (SYNTHROID, LEVOTHROID) 75 MCG tablet Take 75 mcg by mouth every morning.     [provider]    Allergies Hydrocodone and Oxycodone  Family Hx Family History  Problem Relation Age of Onset  . Breast cancer Neg Hx     Social Hx Social History   Tobacco Use  .  Smoking status: Never Smoker  . Smokeless tobacco: Never Used  Substance Use Topics  . Alcohol use: No  . Drug use: No     Review of Systems  Constitutional: Negative for fever. Negative for chills. + generalized fatigue Eyes: Negative for visual changes. ENT: Negative for sore throat. Cardiovascular: Negative for chest pain. Respiratory: Negative for shortness of breath. Gastrointestinal: Negative for abdominal pain. Negative for nausea. Negative for vomiting. Genitourinary: Negative for dysuria.  Musculoskeletal: Negative for leg swelling. Skin: Negative for rash. Neurological: Negative for for headaches. + LOC   Physical Exam  Vital Signs: ED Triage Vitals  Enc Vitals Group     BP 02/09/19 1000 126/64     Pulse Rate 02/09/19 1000 79     Resp 02/09/19 1001 14     Temp 02/09/19 1009 98.3 F (36.8 C)     Temp Source 02/09/19 1009 Oral     SpO2 02/09/19 1000 100 %     Weight 02/09/19 1001 104 lb (47.2 kg)     Height 02/09/19 1001 5\' 3"  (1.6 m)     Head Circumference --      Peak Flow --      Pain Score 02/09/19 1001 0     Pain Loc --      Pain Edu? --      Excl. in GC? --     Constitutional: Alert and oriented x 2 (which is baseline, per daughter at bedside).  Eyes: Conjunctivae clear. Sclera anicteric. Head: Normocephalic. Atraumatic. Nose: No congestion. No rhinorrhea. Mouth/Throat: Mucous membranes are slightly dry.  Neck: No stridor.   Cardiovascular: Normal rate, regular rhythm.  Pacemaker left upper chest.  Extremities well perfused. Respiratory: Normal respiratory effort.  Lungs CTAB. Gastrointestinal: Soft and non-tender. No distention.  Rectal: RN chaperone present, brown stool, guaiac negative Musculoskeletal: No lower extremity edema. Neurologic:  Normal speech and language. No gross focal neurologic deficits are appreciated.  Equal strength throughout. Skin: Skin is warm, dry and intact. No rash noted. Psychiatric: Mood and affect are appropriate for situation.  EKG  Personally reviewed.   Rate: 63 Rhythm: paced Intervals: abnormal due to paced rhythm No acute ischemic changes No STEMI    Radiology  N/A   Procedures  Procedure(s) performed (including critical care):  Procedures   Initial Impression / Assessment and Plan / ED Course  78 y.o. female who presents to the ED for generalized weakness and episode of loss of consciousness today  Ddx: Electrolyte abnormality, infection, arrhythmia.  Presentation not consistent with  seizure.  Plan: Labs, urine, EKG, fluids and reassess  Blood work notable for seemingly new pancytopenia, WBC count 1.7, hemoglobin 9.9, platelets 111.  Rectal exam performed, guaiac negative.  Troponin negative, EKG shows paced rhythm.  Discussed case with heme/onc, added on further labs for work-up of pancytopenia.  Discussed with hospitalist for admission for further work up/management of syncope and pancytopenia.  Unfortunately her COVID swab came back positive, which may be the source of her generalized weakness x1 week as well as perhaps her leukopenia.  Given this we will plan to admit at [facility].  Discussed with [facility] hospitalist for admission.  Updated patient and daughter who are agreeable with plan.    Final Clinical Impression(s) / ED Diagnosis  Final diagnoses:  Loss of consciousness (HCC)  Pancytopenia (HCC)  COVID-19       Note:  This document was prepared using Dragon voice recognition software and may include unintentional dictation errors.  Miguel AschoffMonks, Raven Furnas L., MD 02/09/19 [phone]

## 2019-02-09 NOTE — H&P (Signed)
History and Physical    Teresa Richard MVH:[id number] DOB: 06/30/1940 DOA: 02/09/2019  PCP: Tracie Harrier, MD Patient coming from: [facility]  Chief Complaint: syncope - pancytopenia   HPI: 78 y.o. w/ a hx of B12 deficiency w/ anemia, depression, CHB s/p pacer, and hypothyroidism who presented to the ED after suffering a syncopal spell while sitting in a chair. This followed a week of reported generalized weakness   Upon arrival at [facility] the patient is agitated and is fighting against medical equipment.  She is confused and not able to provide a reliable history.  She will not answer questions pertaining to the review of systems.  There is no evidence of acute respiratory distress.  There is no focal neurologic deficit.  Cranial nerves appear symmetric and intact.  She is freely moving all 4 extremities and actually exhibits significant strength and working against safety restraints.  Assessment/Plan  SARS-CoV-2 positive At this time it appears this may perhaps be an incidental finding -monitor closely without specific pharmacologic therapy at this time -we will need to monitor closely for development of clear pulmonary infiltrates with volume resuscitation  Recent Labs  Lab 02/09/19 1002  FERRITIN 103  ALT 27    Syncopal spell Perhaps simply related to dehydration -monitor on telemetry -follow electrolytes -cycle troponins as patient is not able to provide a history for or against chest pain -consider TTE depending upon findings from initial portion of work-up -follow orthostatic vitals as able -gently hydrate  Pancytopenia The patient was reportedly evaluated by a hematologist while at [facility] -will await consult note and will also follow-up on labs which were drawn there  Reported history of chronic anemia Iron studies suggest a combination of a very poor nutritional state as well as low iron -B12 level is pending  Hypokalemia Supplement to goal of 4.0 -follow magnesium as well   Hypothyroidism Continue home Synthroid dose  Complete heart block status post change to [employer] [id number] H Feb 2020 If no other etiology of the patient's syncope is elucidated we will need to consider having her pacemaker interrogated  DVT prophylaxis: Lovenox Code Status: DNR Family Communication: Nursing staff spoke with daughter earlier as did the physician at [facility] Disposition Plan: Admit to telemetry bed at [facility] called: None  Review of Systems: As per HPI otherwise 10 point review of systems negative.   Past Medical History:  Diagnosis Date  . 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 Anxiety    due to memory loss  . Dementia ([place])   . Depression   . Dysrhythmia   . Hypothyroidism   . Presence of permanent cardiac pacemaker    complete heart block    Past Surgical History:  Procedure Laterality Date  . BREAST CYST EXCISION Left 1980's   negative  . CATARACT EXTRACTION W/PHACO Left 11/11/2016   Procedure: CATARACT EXTRACTION PHACO AND INTRAOCULAR LENS PLACEMENT (IOC);  Surgeon: Birder Robson, MD;  Location: [facility] ORS;  Service: Ophthalmology;  Laterality: Left;  [place]  00:55 AP% 14.7 CDE 8.15 Fluid pack lot # [id number]  . CTR Right   . EYE SURGERY Bilateral    cataract extractions  . FRACTURE SURGERY Right 2005   LEG. nuts and bolts in knee and foot  . IMPLANTABLE CARDIOVERTER DEFIBRILLATOR (ICD) GENERATOR CHANGE Left 07/07/2018   Procedure: PACER CHANGEOUT;  Surgeon: Isaias Cowman, MD;  Location: [facility] ORS;  Service: Cardiovascular;  Laterality: Left;  . INSERT / REPLACE / REMOVE PACEMAKER    .  PACEMAKER INSERTION    . RCR Left 2008    Family History  Family History  Problem Relation Age of Onset  . Breast cancer Neg Hx     Social History   reports that she has never smoked. She has never used smokeless tobacco. She reports that she does not drink alcohol or use drugs.  Allergies Allergies  Allergen Reactions  . Hydrocodone Other  (See Comments)    Feels like she is floating  . Oxycodone Other (See Comments)    Feels like she is floating    Prior to Admission medications   Medication Sig Start Date End Date Taking? Authorizing Provider  ALPRAZolam (XANAX) 0.25 MG tablet Take 0.25 mg by mouth daily. 01/18/19   [provider]  aspirin EC 81 MG tablet Take 81 mg by mouth daily.     [provider]  busPIRone (BUSPAR) 5 MG tablet Take 5 mg by mouth 2 (two) times daily.    [provider]  Cholecalciferol (VITAMIN D3) 1000 units CAPS Take 1,000 Units by mouth daily.     [provider]  Cyanocobalamin (VITAMIN B-12 IJ) Inject 1 application as directed every 4 (four) months. Dr. Ginette Pitman oversees injection. Patient had last dose in 10/19    [provider]  levothyroxine (SYNTHROID, LEVOTHROID) 75 MCG tablet Take 75 mcg by mouth every morning.     [provider]  magnesium oxide (MAG-OX) 400 MG tablet Take 400 mg by mouth daily.    [provider]  Melatonin 5 MG TABS Take 5 mg by mouth at bedtime.    [provider]  QUEtiapine (SEROQUEL) 50 MG tablet Take 50 mg by mouth 3 (three) times daily. 0800, 1400, 2000    [provider]    Physical Exam: Vitals:   02/09/19 1626  BP: (!) 156/71  Pulse: 94  Resp: 11  Temp: 98.3 F (36.8 C)  TempSrc: Oral  SpO2: 100%    Constitutional: no acute resp distress - agitated - confused  ENMT: Mucous membranes are dry. Posterior pharynx not able to be seen as pt won't open mouth/cooperate w/ exam  Neck: normal, supple, no masses, no thyromegaly Respiratory: clear to auscultation bilaterally, no wheezing, no crackles. Normal respiratory effort. No accessory muscle use.  Cardiovascular: Regular rate and rhythm, no murmurs / rubs / gallops. No extremity edema.  Abdomen: no tenderness, no masses palpated. No hepatosplenomegaly. Bowel sounds positive.  Musculoskeletal: no clubbing / cyanosis. No joint  deformity upper and lower extremities. Good ROM, no contractures. Normal muscle tone.  Skin: no rashes, lesions, ulcers. No induration Neurologic: CN 2-12 grossly intact. Strength 5/5 in all 4.  Psychiatric: agitated - delirious    Labs on Admission:   CBC: Recent Labs  Lab 02/09/19 1002  WBC 1.7*  1.7*  NEUTROABS 0.9*  HGB 9.8*  9.9*  HCT 30.7*  31.0*  MCV 91.4  92.3  PLT 112*  024*   Basic Metabolic Panel: Recent Labs  Lab 02/09/19 1002  NA 140  K 3.4*  CL 107  CO2 26  GLUCOSE 97  BUN 22  CREATININE 0.72  CALCIUM 7.6*   GFR: Estimated Creatinine Clearance: 43.2 mL/min (by C-G formula based on SCr of 0.72 mg/dL).   Liver Function Tests: Recent Labs  Lab 02/09/19 1002  AST 33  ALT 27  ALKPHOS 76  BILITOT 0.3  PROT 5.2*  ALBUMIN 3.1*   Anemia Panel: Recent Labs    02/09/19 1002  FERRITIN 103  TIBC 202*  IRON 19*  RETICCTPCT 0.5   Urine analysis:    Component Value Date/Time   COLORURINE YELLOW (A) 02/09/2019 1144   APPEARANCEUR CLEAR (A) 02/09/2019 1144   LABSPEC 1.012 02/09/2019 1144   PHURINE 6.0 02/09/2019 1144   GLUCOSEU NEGATIVE 02/09/2019 1144   HGBUR NEGATIVE 02/09/2019 1144   BILIRUBINUR NEGATIVE 02/09/2019 1144   KETONESUR NEGATIVE 02/09/2019 1144   PROTEINUR NEGATIVE 02/09/2019 1144   NITRITE NEGATIVE 02/09/2019 1144   LEUKOCYTESUR NEGATIVE 02/09/2019 1144    Recent Results (from the past 240 hour(s))  SARS Coronavirus 2 [facility] order, Performed in [facility] hospital lab) Nasopharyngeal Nasopharyngeal Swab     Status: Abnormal   Collection Time: 02/09/19 11:44 AM   Specimen: Nasopharyngeal Swab  Result Value Ref Range Status   SARS Coronavirus 2 POSITIVE (A) NEGATIVE Final    Comment: RESULT CALLED TO, READ BACK BY AND VERIFIED WITH: MAC BROWN 02/09/19 @ 1325  [place] (NOTE) If result is NEGATIVE SARS-CoV-2 target nucleic acids are NOT DETECTED. The SARS-CoV-2 RNA is generally detectable in upper and lower  respiratory  specimens during the acute phase of infection. The lowest  concentration of SARS-CoV-2 viral copies this assay can detect is 250  copies / mL. A negative result does not preclude SARS-CoV-2 infection  and should not be used as the sole basis for treatment or other  patient management decisions.  A negative result may occur with  improper specimen collection / handling, submission of specimen other  than nasopharyngeal swab, presence of viral mutation(s) within the  areas targeted by this assay, and inadequate number of viral copies  (<250 copies / mL). A negative result must be combined with clinical  observations, patient history, and epidemiological information. If result is POSITIVE SARS-CoV-2 target nucleic acids are DETECTED. The SARS -CoV-2 RNA is generally detectable in upper and lower  respiratory specimens during the acute phase of infection.  Positive  results are indicative of active infection with SARS-CoV-2.  Clinical  correlation with patient history and other diagnostic information is  necessary to determine patient infection status.  Positive results do  not rule out bacterial infection or co-infection with other viruses. If result is PRESUMPTIVE POSTIVE SARS-CoV-2 nucleic acids MAY BE PRESENT.   A presumptive positive result was obtained on the submitted specimen  and confirmed on repeat testing.  While 2019 novel coronavirus  (SARS-CoV-2) nucleic acids may be present in the submitted sample  additional confirmatory testing may be necessary for epidemiological  and / or clinical management purposes  to differentiate between  SARS-CoV-2 and other Sarbecovirus currently known to infect humans.  If clinically indicated additional testing with an alternate test  methodology [phone]) is advise d. The SARS-CoV-2 RNA is generally  detectable in upper and lower respiratory specimens during the acute  phase of infection. The expected result is Negative. Fact Sheet for  Patients:  StrictlyIdeas.no Fact Sheet for Healthcare Providers: BankingDealers.co.za This test is not yet approved or cleared by the [place] FDA and has been authorized for detection and/or diagnosis of SARS-CoV-2 by FDA under an Emergency Use Authorization (EUA).  This EUA will remain in effect (meaning this test can be used) for the duration of the COVID-19 declaration under Section 564(b)(1) of the Act, 21 U.S.C. section 360bbb-3(b)(1), unless the authorization is terminated or revoked sooner. Performed at [facility], [address], [place], [place] [address]      Cherene Altes, MD [facility] Office  [phone] Pager -  Text Page per Shea Evans as per below:  On-Call/Text Page:      [email or web address]  If 7PM-7AM, please contact night-coverage [email or web address] 02/09/2019, 5:11 PM

## 2019-02-09 NOTE — ED Notes (Signed)
Spoke with Georgiann Cocker from [place] who reports patient cannot come back to facility since positive covid

## 2019-02-10 ENCOUNTER — Encounter (HOSPITAL_COMMUNITY): Payer: Self-pay

## 2019-02-10 ENCOUNTER — Inpatient Hospital Stay (HOSPITAL_COMMUNITY): Payer: Medicare Other

## 2019-02-10 LAB — CBC WITH DIFFERENTIAL/PLATELET
Abs Immature Granulocytes: 0.01 10*3/uL (ref 0.00–0.07)
Basophils Absolute: 0 10*3/uL (ref 0.0–0.1)
Basophils Relative: 0 %
Eosinophils Absolute: 0 10*3/uL (ref 0.0–0.5)
Eosinophils Relative: 0 %
HCT: 39.6 % (ref 36.0–46.0)
Hemoglobin: 12.5 g/dL (ref 12.0–15.0)
Immature Granulocytes: 0 %
Lymphocytes Relative: 19 %
Lymphs Abs: 0.5 10*3/uL — ABNORMAL LOW (ref 0.7–4.0)
MCH: 29.3 pg (ref 26.0–34.0)
MCHC: 31.6 g/dL (ref 30.0–36.0)
MCV: 92.7 fL (ref 80.0–100.0)
Monocytes Absolute: 0.4 10*3/uL (ref 0.1–1.0)
Monocytes Relative: 16 %
Neutro Abs: 1.7 10*3/uL (ref 1.7–7.7)
Neutrophils Relative %: 65 %
Platelets: 132 10*3/uL — ABNORMAL LOW (ref 150–400)
RBC: 4.27 MIL/uL (ref 3.87–5.11)
RDW: 14.2 % (ref 11.5–15.5)
WBC: 2.7 10*3/uL — ABNORMAL LOW (ref 4.0–10.5)
nRBC: 0 % (ref 0.0–0.2)

## 2019-02-10 LAB — COMPREHENSIVE METABOLIC PANEL
ALT: 31 U/L (ref 0–44)
AST: 33 U/L (ref 15–41)
Albumin: 3.9 g/dL (ref 3.5–5.0)
Alkaline Phosphatase: 94 U/L (ref 38–126)
Anion gap: 11 (ref 5–15)
BUN: 15 mg/dL (ref 8–23)
CO2: 27 mmol/L (ref 22–32)
Calcium: 8.9 mg/dL (ref 8.9–10.3)
Chloride: 102 mmol/L (ref 98–111)
Creatinine, Ser: 0.52 mg/dL (ref 0.44–1.00)
GFR calc Af Amer: >60 mL/min (ref >60)
GFR calc non Af Amer: >60 mL/min (ref >60)
Glucose, Bld: 85 mg/dL (ref 70–99)
Potassium: 4.4 mmol/L (ref 3.5–5.1)
Sodium: 140 mmol/L (ref 135–145)
Total Bilirubin: 0.7 mg/dL (ref 0.3–1.2)
Total Protein: 6.9 g/dL (ref 6.5–8.1)

## 2019-02-10 LAB — D-DIMER, QUANTITATIVE: D-Dimer, Quant: 1.1 ug/mL-FEU — ABNORMAL HIGH (ref 0.00–0.50)

## 2019-02-10 LAB — FERRITIN: Ferritin: 152 ng/mL (ref 11–307)

## 2019-02-10 LAB — ABO/RH: ABO/RH(D): O POS

## 2019-02-10 LAB — MAGNESIUM: Magnesium: 2 mg/dL (ref 1.7–2.4)

## 2019-02-10 LAB — C-REACTIVE PROTEIN: CRP: <0.8 mg/dL (ref <1.0)

## 2019-02-10 MED ORDER — INFLUENZA VAC A&B SA ADJ QUAD 0.5 ML IM PRSY
0.5000 mL | PREFILLED_SYRINGE | INTRAMUSCULAR | Status: DC | PRN
  Filled 2019-02-10: qty 0.5

## 2019-02-10 MED ORDER — MAGNESIUM HYDROXIDE 400 MG/5ML PO SUSP
15.0000 mL | Freq: Every day | ORAL | Status: DC | PRN
  Administered 2019-02-13: 14:00:00 15 mL via ORAL
  Filled 2019-02-10: qty 30

## 2019-02-10 MED ORDER — ENSURE ENLIVE PO LIQD
237.0000 mL | Freq: Two times a day (BID) | ORAL | Status: DC
  Administered 2019-02-10: 237 mL via ORAL

## 2019-02-10 MED ORDER — ENSURE ENLIVE PO LIQD
237.0000 mL | Freq: Three times a day (TID) | ORAL | Status: DC
  Administered 2019-02-10 – 2019-02-17 (×14): 237 mL via ORAL

## 2019-02-10 MED ORDER — LORAZEPAM 2 MG/ML IJ SOLN
1.0000 mg | Freq: Once | INTRAMUSCULAR | Status: AC
  Administered 2019-02-10: 1 mg via INTRAVENOUS
  Filled 2019-02-10: qty 1

## 2019-02-10 MED ORDER — HALOPERIDOL LACTATE 5 MG/ML IJ SOLN
2.0000 mg | Freq: Four times a day (QID) | INTRAMUSCULAR | Status: DC | PRN
  Administered 2019-02-10: 5 mg via INTRAVENOUS
  Filled 2019-02-10: qty 1

## 2019-02-10 NOTE — Progress Notes (Signed)
Patient became agitated, pulling at lines, trying to get out of bed, not following commands, unable to redirect, and hitting staff  PRN meds given  Patients daughter contacted via phone to help reorient patient   PRN not effective, MD paged for further orders

## 2019-02-10 NOTE — Progress Notes (Signed)
Updated patients emergency care contact Teresa Richard on patient plan of care and answered all questions at this time.

## 2019-02-10 NOTE — Progress Notes (Signed)
Sitter at the bedside, wrist restraints removed.

## 2019-02-10 NOTE — Progress Notes (Signed)
Patient's daughter, Shirlean Mylar, updated with plan of care.

## 2019-02-10 NOTE — Progress Notes (Signed)
OT Cancellation Note  Patient Details Name: [name] MRN: [id number] DOB: 03-Jan-1941   Cancelled Treatment:    Reason Eval/Treat Not Completed: Other (comment). Pt sedated due combativeness. Spoke with daughter Shirlean Mylar) over the phone to get PLOF. Pt lived in [place] in a memory care unit in [place]. Pt was ambulatory and completed her self care independently without an AD. Pt does enjoy listening to old traditional gospel hymns, which may be calming for pt. Will follow up for appropriateness tomorrow.   Maranatha Grossi,HILLARY 02/10/2019, 2:23 PM

## 2019-02-10 NOTE — Progress Notes (Signed)
Initial Nutrition Assessment RD working remotely.  DOCUMENTATION CODES:   Underweight  INTERVENTION:   Ensure Enlive po TID, each supplement provides 350 kcal and 20 grams of protein.  Pt receiving Hormel Shake daily with Breakfast which provides 520 kcals and 22 g of protein and Magic cup BID with lunch and dinner, each supplement provides 290 kcal and 9 grams of protein, automatically on meal trays to optimize nutritional intake.   Continue MVI.  NUTRITION DIAGNOSIS:   Increased nutrient needs related to acute illness(COVID) as evidenced by estimated needs.  GOAL:   Patient will meet greater than or equal to 90% of their needs  MONITOR:   PO intake, Supplement acceptance  REASON FOR ASSESSMENT:   Malnutrition Screening Tool    ASSESSMENT:   78 yo female admitted with syncope; found to be COVID-19 positive. Patient resides in a memory care unit. PMH also includes B12 deficiency, anemia, depression, CHB s/p pacemaker, hypothyroidism.   Unable to obtain any nutrition hx from patient due to dementia. She was given an Ensure this morning.   Weight encounters reviewed. 12% weight loss within the past 3.5 months is significant for the time frame. Suspect intake PTA was poor. Patient is at high nutrition risk. Suspect malnutrition related to chronic illness.   Labs reviewed.  Medications reviewed and include Mag-ox, MVI, Senokot. IVF: NS at 75 ml/h  NUTRITION - FOCUSED PHYSICAL EXAM:  unable to complete  Diet Order:   Diet Order            Diet regular Room service appropriate? Yes; Fluid consistency: Thin  Diet effective now              EDUCATION NEEDS:   Not appropriate for education at this time  Skin:  Skin Assessment: Reviewed RN Assessment  Last BM:  9/9 (type 4)  Height:   Ht Readings from Last 1 Encounters:  02/09/19 5\' 3"  (1.6 m)    Weight:   Wt Readings from Last 1 Encounters:  02/09/19 47.2 kg    Ideal Body Weight:  52.3 kg  BMI:   18.4  Estimated Nutritional Needs:   Kcal:  1400-1600  Protein:  60-70 gm  Fluid:  >/= 1.4 L    Molli Barrows, RD, LDN, [place] Pager [phone] After Hours Pager [phone]

## 2019-02-10 NOTE — Progress Notes (Signed)
Patient slept well overnight. Was not combative or agitated with staff. Obtained an order for a 1:1 sitter this AM in order to attempt to remove wrist restraints. Was a maximum two assist to [facility]. CXR completed. Spoke with daughter again this AM to update.

## 2019-02-10 NOTE — Progress Notes (Signed)
PT Cancellation Note  Patient Details Name: Teresa Richard MRN: [id number] DOB: June 01, 1941   Cancelled Treatment:    Reason Eval/Treat Not Completed: Fatigue/lethargy limiting ability to participate   Claretha Cooper 02/10/2019, 3:39 PM

## 2019-02-10 NOTE — Progress Notes (Addendum)
[name]  JSH:[id number] DOB: [birth year] DOA: 02/09/2019 PCP: Tracie Harrier, MD    Brief Narrative:  78yo resident of a memory care unit w/ a hx of B12 deficiency w/ anemia, depression, CHB s/p pacer, and hypothyroidism who presented to the ED after suffering a syncopal spell while sitting in a chair. This followed a week of reported generalized weakness.  Upon arrival at [facility] the patient was agitated and fighting against medical equipment.  She was confused and not able to provide a reliable history.  Significant Events: 9/9 admit to [facility] via [facility] ED   COVID-19 specific Treatment: none  Subjective: The patient has been afebrile since arriving at [facility].  She is saturating at 100% on room air.  She is much more alert at the time of my visit today and will carry on a conversation.  She is not fully oriented to place or situation.  Assessment & Plan:  SARS-CoV-2 positive At this time it appears this may perhaps be an incidental finding -monitor closely without specific pharmacologic therapy at this time -we will need to monitor closely for development of clear pulmonary infiltrates with volume resuscitation -I have reviewed her chest x-ray today and note that it is without infiltrates despite hydration overnight  Recent Labs  Lab 02/09/19 1002 02/10/19 0500  DDIMER  --  1.10*  FERRITIN 103 152  CRP  --  <0.8  ALT 27 31    Syncopal spell Perhaps simply related to dehydration -monitor on telemetry -follow electrolytes - consider TTE -not orthostatic this morning -stop hydration and follow with mobility  Pancytopenia The patient was reportedly evaluated by a Hematologist while at [facility], but an official note has not been entered -all counts on her CBC are trending upward -continue to follow for now -perhaps this is a consequence of the viral infection itself  Reported history of chronic anemia Iron studies suggest a  combination of a very poor nutritional state as well as low iron - I50 and folic acid levels pending  Hypokalemia Corrected with supplementation  Hypothyroidism Continue home Synthroid dose  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 rate 20, SpO2 100 %.  Intake/Output Summary (Last 24 hours) at 02/10/2019 1742 Last data filed at 02/10/2019 1500 Gross per 24 hour  Intake 772.11 ml  Output 350 ml  Net 422.11 ml   There were no vitals filed for this visit.  Examination: General: No acute respiratory distress Lungs: Clear to auscultation bilaterally without wheezes or crackles Cardiovascular: Regular rate and rhythm without murmur gallop or rub normal S1 and S2 Abdomen: Nontender, nondistended, soft, bowel sounds positive, no rebound, no ascites, no appreciable mass Extremities: No significant cyanosis, clubbing, or edema bilateral lower extremities  CBC: Recent Labs  Lab 02/09/19 1002 02/10/19 0500  WBC 1.7*  1.7* 2.7*  NEUTROABS 0.9* 1.7  HGB 9.8*  9.9* 12.5  HCT 30.7*  31.0* 39.6  MCV 91.4  92.3 92.7  PLT 112*  111* 297*   Basic Metabolic Panel: Recent Labs  Lab 02/09/19 1002 02/10/19 0500  NA 140 140  K 3.4* 4.4  CL 107 102  CO2 26 27  GLUCOSE 97 85  BUN 22 15  CREATININE 0.72 0.52  CALCIUM 7.6* 8.9  MG  --  2.0   GFR: Estimated Creatinine Clearance: 43.2 mL/min (by C-G formula based on SCr of 0.52 mg/dL).  Liver  Function Tests: Recent Labs  Lab 02/09/19 1002 02/10/19 0500  AST 33 33  ALT 27 31  ALKPHOS 76 94  BILITOT 0.3 0.7  PROT 5.2* 6.9  ALBUMIN 3.1* 3.9     Recent Results (from the past 240 hour(s))  SARS Coronavirus 2 [facility] order, Performed in [facility] hospital lab) Nasopharyngeal Nasopharyngeal Swab     Status: Abnormal   Collection Time: 02/09/19 11:44 AM   Specimen: Nasopharyngeal Swab  Result Value Ref Range Status   SARS Coronavirus 2 POSITIVE (A) NEGATIVE Final    Comment: RESULT CALLED TO, READ BACK BY AND VERIFIED WITH: MAC BROWN 02/09/19 @ 1325  [place] (NOTE) If result is NEGATIVE SARS-CoV-2 target nucleic acids are NOT DETECTED. The SARS-CoV-2 RNA is generally detectable in upper and lower  respiratory specimens during the acute phase of infection. The lowest  concentration of SARS-CoV-2 viral copies this assay can detect is 250  copies / mL. A negative result does not preclude SARS-CoV-2 infection  and should not be used as the sole basis for treatment or other  patient management decisions.  A negative result may occur with  improper specimen collection / handling, submission of specimen other  than nasopharyngeal swab, presence of viral mutation(s) within the  areas targeted by this assay, and inadequate number of viral copies  (<250 copies / mL). A negative result must be combined with clinical  observations, patient history, and epidemiological information. If result is POSITIVE SARS-CoV-2 target nucleic acids are DETECTED. The SARS -CoV-2 RNA is generally detectable in upper and lower  respiratory specimens during the acute phase of infection.  Positive  results are indicative of active infection with SARS-CoV-2.  Clinical  correlation with patient history and other diagnostic information is  necessary to determine patient infection status.  Positive results do  not rule out bacterial infection or co-infection with other viruses. If result is PRESUMPTIVE  POSTIVE SARS-CoV-2 nucleic acids MAY BE PRESENT.   A presumptive positive result was obtained on the submitted specimen  and confirmed on repeat testing.  While 2019 novel coronavirus  (SARS-CoV-2) nucleic acids may be present in the submitted sample  additional confirmatory testing may be necessary for epidemiological  and / or clinical management purposes  to differentiate between  SARS-CoV-2 and other Sarbecovirus currently known to infect humans.  If clinically indicated additional testing with an alternate test  methodology [phone]) is advise d. The SARS-CoV-2 RNA is generally  detectable in upper and lower respiratory specimens during the acute  phase of infection. The expected result is Negative. Fact Sheet for Patients:  StrictlyIdeas.no Fact Sheet for Healthcare Providers: BankingDealers.co.za This test is not yet approved or cleared by the [place] FDA and has been authorized for detection and/or diagnosis of SARS-CoV-2 by FDA under an Emergency Use Authorization (EUA).  This EUA will remain in effect (meaning this test can be used) for the duration of the COVID-19 declaration under Section 564(b)(1) of the Act, 21 U.S.C. section 360bbb-3(b)(1), unless the authorization is terminated or revoked sooner. Performed at [facility], [place]  [address], [place], [place] [address]   MRSA PCR Screening     Status: None   Collection Time: 02/09/19  4:56 PM   Specimen: Nasal Mucosa; Nasopharyngeal  Result Value Ref Range Status   MRSA by PCR NEGATIVE NEGATIVE Final    Comment:        The GeneXpert MRSA Assay (FDA approved for NASAL specimens only), is one component of a comprehensive MRSA colonization surveillance program. It is not intended to diagnose MRSA infection nor to guide or monitor treatment for MRSA infections. Performed at [facility], [place] [address], [place], [place] [address]       Scheduled Meds: . aspirin EC  81 mg Oral Daily  . busPIRone  5 mg Oral BID  . enoxaparin (LOVENOX) injection  40 mg Subcutaneous Q24H  . feeding supplement (ENSURE ENLIVE)  237 mL Oral TID BM  . levothyroxine  75 mcg Oral BH-q7a  . magnesium oxide  400 mg Oral Daily  . Melatonin  5 mg Oral QHS  . multivitamin with minerals  1 tablet Oral Daily  . QUEtiapine  50 mg Oral 3 times per day  . senna  1 tablet Oral BID   Continuous Infusions: . sodium chloride 75 mL/hr at 02/10/19 1512     LOS: 1 day   Cherene Altes, MD [facility] Office  [phone] Pager - Text Page per Shea Evans  If 7PM-7AM, please contact night-coverage per Amion 02/10/2019, 5:42 PM

## 2019-02-11 DIAGNOSIS — E876 Hypokalemia: Secondary | ICD-10-CM

## 2019-02-11 LAB — CBC WITH DIFFERENTIAL/PLATELET
Abs Immature Granulocytes: 0.02 10*3/uL (ref 0.00–0.07)
Basophils Absolute: 0 10*3/uL (ref 0.0–0.1)
Basophils Relative: 0 %
Eosinophils Absolute: 0 10*3/uL (ref 0.0–0.5)
Eosinophils Relative: 0 %
HCT: 38.1 % (ref 36.0–46.0)
Hemoglobin: 12.2 g/dL (ref 12.0–15.0)
Immature Granulocytes: 1 %
Lymphocytes Relative: 16 %
Lymphs Abs: 0.5 10*3/uL — ABNORMAL LOW (ref 0.7–4.0)
MCH: 28.9 pg (ref 26.0–34.0)
MCHC: 32 g/dL (ref 30.0–36.0)
MCV: 90.3 fL (ref 80.0–100.0)
Monocytes Absolute: 0.3 10*3/uL (ref 0.1–1.0)
Monocytes Relative: 11 %
Neutro Abs: 2.3 10*3/uL (ref 1.7–7.7)
Neutrophils Relative %: 72 %
Platelets: 125 10*3/uL — ABNORMAL LOW (ref 150–400)
RBC: 4.22 MIL/uL (ref 3.87–5.11)
RDW: 13.9 % (ref 11.5–15.5)
WBC: 3.1 10*3/uL — ABNORMAL LOW (ref 4.0–10.5)
nRBC: 0 % (ref 0.0–0.2)

## 2019-02-11 LAB — MAGNESIUM: Magnesium: 1.8 mg/dL (ref 1.7–2.4)

## 2019-02-11 LAB — COMPREHENSIVE METABOLIC PANEL
ALT: 23 U/L (ref 0–44)
AST: 26 U/L (ref 15–41)
Albumin: 3.6 g/dL (ref 3.5–5.0)
Alkaline Phosphatase: 84 U/L (ref 38–126)
Anion gap: 8 (ref 5–15)
BUN: 17 mg/dL (ref 8–23)
CO2: 30 mmol/L (ref 22–32)
Calcium: 8.8 mg/dL — ABNORMAL LOW (ref 8.9–10.3)
Chloride: 100 mmol/L (ref 98–111)
Creatinine, Ser: 0.56 mg/dL (ref 0.44–1.00)
GFR calc Af Amer: >60 mL/min (ref >60)
GFR calc non Af Amer: >60 mL/min (ref >60)
Glucose, Bld: 111 mg/dL — ABNORMAL HIGH (ref 70–99)
Potassium: 3.3 mmol/L — ABNORMAL LOW (ref 3.5–5.1)
Sodium: 138 mmol/L (ref 135–145)
Total Bilirubin: 0.6 mg/dL (ref 0.3–1.2)
Total Protein: 6.5 g/dL (ref 6.5–8.1)

## 2019-02-11 LAB — D-DIMER, QUANTITATIVE: D-Dimer, Quant: 0.36 ug/mL-FEU (ref 0.00–0.50)

## 2019-02-11 LAB — COMP PANEL: LEUKEMIA/LYMPHOMA: CLINICAL INFO: 9

## 2019-02-11 LAB — FERRITIN: Ferritin: 246 ng/mL (ref 11–307)

## 2019-02-11 LAB — VITAMIN B12: Vitamin B-12: 256 pg/mL (ref 180–914)

## 2019-02-11 LAB — C-REACTIVE PROTEIN: CRP: 1.3 mg/dL — ABNORMAL HIGH (ref <1.0)

## 2019-02-11 LAB — FOLATE: Folate: 14.2 ng/mL (ref >5.9)

## 2019-02-11 MED ORDER — POTASSIUM CHLORIDE 10 MEQ/100ML IV SOLN
10.0000 meq | INTRAVENOUS | Status: AC
  Administered 2019-02-11 (×3): 10 meq via INTRAVENOUS
  Filled 2019-02-11 (×2): qty 100

## 2019-02-11 MED ORDER — CYANOCOBALAMIN 1000 MCG/ML IJ SOLN
1000.0000 ug | Freq: Once | INTRAMUSCULAR | Status: AC
  Administered 2019-02-11: 19:00:00 1000 ug via SUBCUTANEOUS
  Filled 2019-02-11: qty 1

## 2019-02-11 MED ORDER — MAGNESIUM SULFATE 2 GM/50ML IV SOLN
2.0000 g | Freq: Once | INTRAVENOUS | Status: AC
  Administered 2019-02-11: 2 g via INTRAVENOUS
  Filled 2019-02-11: qty 50

## 2019-02-11 NOTE — Evaluation (Signed)
Physical Therapy Evaluation Patient Details Name: Teresa PavlovLinda Richard MRN: [id number] DOB: 07/13/1940 Today's Date: 02/11/2019   History of Present Illness  78 y.o. w/ a hx of B12 deficiency w/ anemia, depression, CHB s/p pacer, and hypothyroidism who presented to the ED from ALF after suffering a syncopal spell while sitting in a chair. This followed a week of reported generalized weakness  Clinical Impression  The patient is restless, able to assist with stand and pivot to recliner with 2 assisting, significant posterior lean. Per report, pt was ambulatory independently PTA. Pt admitted with above diagnosis. Pt currently with functional limitations due to the deficits listed below (see PT Problem List). Pt will benefit from skilled PT to increase their independence and safety with mobility to allow discharge to the venue listed below.       Follow Up Recommendations SNF(if able to pariticipate in therapy)    Equipment Recommendations  None recommended by PT    Recommendations for Other Services       Precautions / Restrictions Precautions Precautions: Fall Precaution Comments: At risk for skin breakdown, incontinent      Mobility  Bed Mobility Overal bed mobility: Needs Assistance Bed Mobility: Supine to Sit     Supine to sit: Max assist     General bed mobility comments: assist with legs, pt. did assist a little with trunk, scoot to bed edge  Transfers Overall transfer level: Needs assistance Equipment used: 2 person hand held assist Transfers: Sit to/from Stand;Stand Pivot Transfers Sit to Stand: Max assist;+2 physical assistance Stand pivot transfers: Max assist;+2 physical assistance       General transfer comment: Pt impulsively initating stand with significant posterior lean, tactile cues to sit down to recliner.  Ambulation/Gait Ambulation/Gait assistance: Max assist;+2 physical assistance;+2 safety/equipment Gait Distance (Feet): 4 Feet Assistive device: 2  person hand held assist Gait Pattern/deviations: Leaning posteriorly;Staggering left;Staggering right     General Gait Details: posterior lean, shuffled steps to recliner.  Stairs            Wheelchair Mobility    Modified Rankin (Stroke Patients Only)       Balance Overall balance assessment: Needs assistance   Sitting balance-Leahy Scale: Poor   Postural control: Posterior lean   Standing balance-Leahy Scale: Poor                               Pertinent Vitals/Pain Pain Assessment: Faces Faces Pain Scale: Hurts a little bit Pain Location: general discomfort Pain Descriptors / Indicators: Guarding Pain Intervention(s): Monitored during session    Home Living Family/patient expects to be discharged to:: Skilled nursing facility                      Prior Function Level of Independence: Needs assistance   Gait / Transfers Assistance Needed: independent ambulator at ALF  ADL's / Homemaking Assistance Needed: Did her own self care per daughter        Hand Dominance   Dominant Hand: Right    Extremity/Trunk Assessment   Upper Extremity Assessment Upper Extremity Assessment: Generalized weakness    Lower Extremity Assessment Lower Extremity Assessment: Generalized weakness    Cervical / Trunk Assessment Cervical / Trunk Assessment: Kyphotic  Communication   Communication: Expressive difficulties  Cognition Arousal/Alertness: Lethargic Behavior During Therapy: Restless;Flat affect;Impulsive Overall Cognitive Status: Impaired/Different from baseline Area of Impairment: Orientation;Attention;Memory;Following commands;Safety/judgement;Awareness;Problem solving  Orientation Level: Disoriented to;Place;Time;Situation Current Attention Level: Divided Memory: Decreased recall of precautions;Decreased short-term memory Following Commands: (does not follow commands) Safety/Judgement: Decreased awareness of  safety;Decreased awareness of deficits Awareness: Intellectual Problem Solving: Slow processing;Decreased initiation;Difficulty sequencing;Requires verbal cues;Requires tactile cues General Comments: pt puulling on mitts, removed mits for mobility, reaching for things. placed shake in right hand and assisted to [facility] and pt. did drinkd a few sips. Noted coughing.      General Comments General comments (skin integrity, edema, etc.): required 2 max assist to stand, posterior lean    Exercises     Assessment/Plan    PT Assessment Patient needs continued PT services  PT Problem List Decreased strength;Decreased mobility;Decreased safety awareness;Decreased range of motion;Decreased knowledge of precautions;Decreased activity tolerance;Decreased cognition;Decreased balance;Decreased knowledge of use of DME       PT Treatment Interventions Gait training;Therapeutic activities;Therapeutic exercise;Cognitive remediation;Functional mobility training;Patient/family education    PT Goals (Current goals can be found in the Care Plan section)  Acute Rehab PT Goals Patient Stated Goal: pt unable to state PT Goal Formulation: Patient unable to participate in goal setting Time For Goal Achievement: 02/25/19 Potential to Achieve Goals: Fair    Frequency Min 2X/week   Barriers to discharge        Co-evaluation PT/OT/SLP Co-Evaluation/Treatment: Yes Reason for Co-Treatment: For patient/therapist safety PT goals addressed during session: Mobility/safety with mobility OT goals addressed during session: ADL's and self-care       AM-PAC PT "6 Clicks" Mobility  Outcome Measure Help needed turning from your back to your side while in a flat bed without using bedrails?: Total Help needed moving from lying on your back to sitting on the side of a flat bed without using bedrails?: Total Help needed moving to and from a bed to a chair (including a wheelchair)?: Total Help needed standing up from a  chair using your arms (e.g., wheelchair or bedside chair)?: Total Help needed to walk in hospital room?: Total Help needed climbing 3-5 steps with a railing? : Total 6 Click Score: 6    End of Session   Activity Tolerance: Patient limited by lethargy;Patient limited by fatigue Patient left: in chair;with call bell/phone within reach;with nursing/sitter in room Nurse Communication: Mobility status PT Visit Diagnosis: Unsteadiness on feet (R26.81);History of falling (Z91.81)    Time: 3875-6433 PT Time Calculation (min) (ACUTE ONLY): 42 min   Charges:   PT Evaluation $PT Eval Moderate Complexity: [place]  Office [phone]   Claretha Cooper 02/11/2019, 1:23 PM

## 2019-02-11 NOTE — Progress Notes (Signed)
[place]  Kamaiya Antilla  ZPH:[id number] DOB: 1940-09-15 DOA: 02/09/2019 PCP: Tracie Harrier, MD    Brief Narrative:  78yo resident of a memory care unit w/ a hx of B12 deficiency w/ anemia, depression, CHB s/p pacer, and hypothyroidism who presented to the ED after suffering a syncopal spell while sitting in a chair. This followed a week of reported generalized weakness.  Upon arrival at [facility] the patient was agitated and fighting against medical equipment.  She was confused and not able to provide a reliable history.  Significant Events: 9/9 admit to [facility] via [facility] ED   COVID-19 specific Treatment: none  Subjective: Ongoing agitation is complicating care.  Patient is stabilizing otherwise.  It would be in her best interest to be discharged from the hospital as soon as possible due to the deleterious impact on her mental state. No new medical issues this morning.   Assessment & Plan:  SARS-CoV-2 positive At this time it appears this is essentially an incidental finding - monitor closely without specific pharmacologic therapy at this time - follow-up chest x-ray 9/10 continued to reveal no infiltrate despite volume resuscitation  Recent Labs  Lab 02/09/19 1002 02/10/19 0500 02/11/19 0203  DDIMER  --  1.10* 0.36  FERRITIN 103 152 246  CRP  --  <0.8 1.3*  ALT _0 Syncopal spell Likely simply related to dehydration - will not d/c tele - not orthostatic this morning -stop hydration -increase mobility -in absence of a prominent murmur or persisting symptoms do not feel a TTE would add much to her care at this time  Pancytopenia The patient was reportedly evaluated by a Hematologist while at [facility], but an official note has not been entered -all counts on her CBC continue to trend upward, with her hemoglobin now in the normal range and WBC count approaching normal - likely this is a consequence of the viral infection itself  Reported  history of chronic anemia Iron studies suggest a combination of a very poor nutritional state as well as low iron -folic acid level is normal but B12 is borderline -will supplement B12  Borderline B12 deficiency Supplement - will need outpatient follow-up  Hypokalemia Recurring -likely due to poor dietary intake -supplement and follow  Borderline hypomagnesemia Likely due to poor dietary intake - supplement and follow  Hypothyroidism Continue home Synthroid dose -check TSH in a.m.  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 rate 20, height _1  (1.6 m), weight 47.2 kg, SpO2 100 %.  Intake/Output Summary (Last 24 hours) at 02/11/2019 0837 Last data filed at 02/10/2019 1500 Gross per 24 hour  Intake 237 ml  Output -  Net 237 ml   Filed Weights   02/10/19 1817  Weight: 47.2 kg    Examination: General: No acute respiratory distress - alert but confused  Lungs: Clear to auscultation bilaterally - no wheeze  Cardiovascular: RRR - no M or rub  Abdomen: NT/ND, soft, bs+, no mass  Extremities: No C/C/E B LE   CBC: Recent Labs  Lab 02/09/19 1002 02/10/19 0500 02/11/19 0203  WBC 1.7*  1.7* 2.7* 3.1*  NEUTROABS 0.9* 1.7 2.3  HGB 9.8*  9.9* 12.5 12.2  HCT 30.7*  31.0* 39.6 38.1  MCV 91.4  92.3 92.7 90.3  PLT 112*  111* 132* 712*   Basic Metabolic Panel: Recent Labs  Lab 02/09/19 1002 02/10/19 0500 02/11/19 0203  NA 140 140 138  K 3.4* 4.4 3.3*  CL 107 102 100  CO2 _0 GLUCOSE 97 85 111*  BUN _1 CREATININE 0.72 0.52 0.56  CALCIUM 7.6* 8.9 8.8*  MG  --  2.0 1.8   GFR:  Estimated Creatinine Clearance: 43.2 mL/min (by C-G formula based on SCr of 0.56 mg/dL).  Liver Function Tests: Recent Labs  Lab 02/09/19 1002 02/10/19 0500 02/11/19 0203  AST 33 33 26  ALT _2 ALKPHOS 76 94 84  BILITOT 0.3 0.7 0.6  PROT 5.2* 6.9 6.5  ALBUMIN 3.1* 3.9 3.6     Recent Results (from the past 240 hour(s))  SARS Coronavirus 2 [facility] order, Performed in [facility] hospital lab) Nasopharyngeal Nasopharyngeal Swab     Status: Abnormal   Collection Time: 02/09/19 11:44 AM   Specimen: Nasopharyngeal Swab  Result Value Ref Range Status   SARS Coronavirus 2 POSITIVE (A) NEGATIVE Final    Comment: RESULT CALLED TO, READ BACK BY AND VERIFIED WITH: MAC BROWN 02/09/19 @ 1325  [place] (NOTE) If result is NEGATIVE SARS-CoV-2 target nucleic acids are NOT DETECTED. The SARS-CoV-2 RNA is generally detectable in upper and lower  respiratory specimens during the acute phase of infection. The lowest  concentration of SARS-CoV-2 viral copies this assay can detect is 250  copies / mL. A negative result does not preclude SARS-CoV-2 infection  and should not be used as the sole basis for treatment or other  patient management decisions.  A negative result may occur with  improper specimen collection / handling, submission of specimen other  than nasopharyngeal swab, presence of viral mutation(s) within the  areas targeted by this assay, and inadequate number of viral copies  (<250 copies / mL). A negative result must be combined with clinical  observations, patient history, and epidemiological information. If result is POSITIVE SARS-CoV-2 target nucleic acids are DETECTED. The SARS -CoV-2 RNA is generally detectable in upper and lower  respiratory specimens during the acute phase of infection.  Positive  results are indicative of active infection with SARS-CoV-2.  Clinical  correlation with patient history and other diagnostic information is  necessary to determine patient  infection status.  Positive results do  not rule out bacterial infection or co-infection with other viruses. If result is PRESUMPTIVE POSTIVE SARS-CoV-2 nucleic acids MAY BE PRESENT.   A presumptive positive result was obtained on the submitted specimen  and confirmed on repeat testing.  While 2019 novel coronavirus  (SARS-CoV-2) nucleic acids may be present in the submitted sample  additional confirmatory testing may be necessary for epidemiological  and / or clinical management purposes  to differentiate between  SARS-CoV-2 and other Sarbecovirus currently known to infect humans.  If clinically indicated additional testing with an alternate test  methodology [phone]) is advise d. The SARS-CoV-2 RNA is generally  detectable in upper and lower respiratory specimens during the acute  phase of infection. The expected result is Negative. Fact Sheet for Patients:  StrictlyIdeas.no Fact Sheet for Healthcare Providers: BankingDealers.co.za This test is not yet approved or cleared by the [place] FDA and has been authorized for detection and/or diagnosis of SARS-CoV-2 by FDA under an  Emergency Use Authorization (EUA).  This EUA will remain in effect (meaning this test can be used) for the duration of the COVID-19 declaration under Section 564(b)(1) of the Act, 21 U.S.C. section 360bbb-3(b)(1), unless the authorization is terminated or revoked sooner. Performed at [facility], [place] [place], [place] [address]   MRSA PCR Screening     Status: None   Collection Time: 02/09/19  4:56 PM   Specimen: Nasal Mucosa; Nasopharyngeal  Result Value Ref Range Status   MRSA by PCR NEGATIVE NEGATIVE Final    Comment:        The GeneXpert MRSA Assay (FDA approved for NASAL specimens only), is one component of a comprehensive MRSA colonization surveillance program. It is not intended to diagnose MRSA infection nor to guide or  monitor treatment for MRSA infections. Performed at [facility], [place] [address], [place], [place] [address]      Scheduled Meds: . aspirin EC  81 mg Oral Daily  . busPIRone  5 mg Oral BID  . enoxaparin (LOVENOX) injection  40 mg Subcutaneous Q24H  . feeding supplement (ENSURE ENLIVE)  237 mL Oral TID BM  . levothyroxine  75 mcg Oral BH-q7a  . magnesium oxide  400 mg Oral Daily  . Melatonin  5 mg Oral QHS  . multivitamin with minerals  1 tablet Oral Daily  . QUEtiapine  50 mg Oral 3 times per day  . senna  1 tablet Oral BID   Continuous Infusions: . sodium chloride 50 mL/hr at 02/10/19 1818     LOS: 2 days   Cherene Altes, MD [facility] Office  [phone] Pager - Text Page per Shea Evans  If 7PM-7AM, please contact night-coverage per Amion 02/11/2019, 8:37 AM

## 2019-02-11 NOTE — Progress Notes (Signed)
PT Cancellation Note  Patient Details Name: Teresa Richard MRN: [id number] DOB: 12-Jun-1940   Cancelled Treatment:     Kennon Holter with Patient, pt. In mitts, restless, throwing legs over bed edge. Has been  Agitated. Will check back later for possible mobility . [place]  Office [phone]    Claretha Cooper 02/11/2019, 8:29 AM

## 2019-02-11 NOTE — Progress Notes (Signed)
Occupational Therapy Evaluation Patient Details Name: Teresa PavlovLinda Richard MRN: [id number] DOB: 12/12/1940 Today's Date: 02/11/2019    History of Present Illness 78 y.o. w/ a hx of B12 deficiency w/ anemia, depression, CHB s/p pacer, and hypothyroidism who presented to the ED after suffering a syncopal spell while sitting in a chair. This followed a week of reported generalized weakness    Clinical Impression   Spoke with daughter Teresa Richard(Teresa Richard) over the phone. PTA, pt was a functional ambulator and completed her own self care at [place] ALF in [place]. Pt recently moved from Independent Living to ALF memory unit. Pt is [employer]an and enjoys traditional hymns. She also enjoyed playing cards. Daughter states her mother "thinks she has recetnly had a baby and often talks to her deceased husband Teresa Richard(Teresa Richard). Pt able to progress OOB to chair with +2 Max A - pt with significant posterior lean. Given PLOF, pt's inability to move is most likely contributing to her restlessness in addition to unfamiliar environemnt and staff. Recommend lights on during the day, playing/singing hymns to calm pt and standing pt throughout the day with+2 A to reduce restlessness. Pt will require rehab at SNF - unsure if her facility has SNF level of care. Will follow acutely.     Follow Up Recommendations  SNF;Supervision/Assistance - 24 hour    Equipment Recommendations  None recommended by OT    Recommendations for Other Services       Precautions / Restrictions Precautions Precautions: Fall Precaution Comments: At risk for skin breakdown      Mobility Bed Mobility                  Transfers Overall transfer level: Needs assistance Equipment used: 2 person hand held assist Transfers: Sit to/from BJ'sStand;Stand Pivot Transfers Sit to Stand: Max assist;+2 physical assistance Stand pivot transfers: Max assist;+2 physical assistance       General transfer comment: Pt impulsively initating stadn with significatn posteiror lean    Balance Overall balance assessment: Needs assistance   Sitting balance-Leahy Scale: Poor   Postural control: Posterior lean   Standing balance-Leahy Scale: Poor(strong posterior push)                             ADL either performed or assessed with clinical judgement   ADL Overall ADL's : Needs assistance/impaired Eating/Feeding: Maximal assistance Eating/Feeding Details (indicate cue type and reason): able to help hold a cup to drink Grooming: Maximal assistance Grooming Details (indicate cue type and reason): initiated washing her face with a warm cloth once sitting up in chair Upper Body Bathing: Total assistance   Lower Body Bathing: Total assistance   Upper Body Dressing : Total assistance   Lower Body Dressing: Total assistance   Toilet Transfer: +2 for physical assistance;Maximal assistance Toilet Transfer Details (indicate cue type and reason): simulated to chair         Functional mobility during ADLs: Maximal assistance;+2 for physical assistance       Vision   Additional Comments: eyes closed majority of session     Perception     Praxis      Pertinent Vitals/Pain Pain Assessment: Faces Faces Pain Scale: Hurts a little bit Pain Location: general discomfort Pain Descriptors / Indicators: Guarding Pain Intervention(s): Limited activity within patient's tolerance     Hand Dominance Right   Extremity/Trunk Assessment Upper Extremity Assessment Upper Extremity Assessment: Generalized weakness   Lower Extremity Assessment Lower Extremity Assessment: Defer to  PT evaluation   Cervical / Trunk Assessment Cervical / Trunk Assessment: Other exceptions(posterior bias)   Communication Communication Communication: Expressive difficulties(very soft vocal quality; difficulty understanding at times)   Cognition Arousal/Alertness: Lethargic Behavior During Therapy: Restless;Flat affect;Impulsive Overall Cognitive Status: Impaired/Different  from baseline Area of Impairment: Orientation;Attention;Memory;Following commands;Safety/judgement;Awareness;Problem solving                 Orientation Level: Disoriented to;Place;Time;Situation Current Attention Level: Focused Memory: Decreased recall of precautions;Decreased short-term memory Following Commands: (does not follow commands) Safety/Judgement: Decreased awareness of safety;Decreased awareness of deficits Awareness: Intellectual Problem Solving: Slow processing;Decreased initiation;Difficulty sequencing;Requires verbal cues;Requires tactile cues     General Comments       Exercises     Shoulder Instructions      Home Living Family/patient expects to be discharged to:: Skilled nursing facility                                        Prior Functioning/Environment Level of Independence: Needs assistance  Gait / Transfers Assistance Needed: independent ambulator at ALF ADL's / Homemaking Assistance Needed: Did her own self care per daughter            OT Problem List: Decreased strength;Decreased activity tolerance;Impaired balance (sitting and/or standing);Decreased coordination;Decreased cognition;Decreased safety awareness;Cardiopulmonary status limiting activity      OT Treatment/Interventions: Self-care/ADL training;Therapeutic exercise;Neuromuscular education;Energy conservation;DME and/or AE instruction;Therapeutic activities;Cognitive remediation/compensation;Patient/family education;Visual/perceptual remediation/compensation;Balance training    OT Goals(Current goals can be found in the care plan section) Acute Rehab OT Goals Patient Stated Goal: pt unable to state(per daughter to return to ALF) OT Goal Formulation: With family Time For Goal Achievement: 02/25/19 Potential to Achieve Goals: Fair  OT Frequency: Min 2X/week   Barriers to D/C:            Co-evaluation PT/OT/SLP Co-Evaluation/Treatment: Yes Reason for  Co-Treatment: Necessary to address cognition/behavior during functional activity;For patient/therapist safety;To address functional/ADL transfers   OT goals addressed during session: ADL's and self-care      AM-PAC OT "6 Clicks" Daily Activity     Outcome Measure Help from another person eating meals?: A Lot Help from another person taking care of personal grooming?: A Lot Help from another person toileting, which includes using toliet, bedpan, or urinal?: Total Help from another person bathing (including washing, rinsing, drying)?: Total Help from another person to put on and taking off regular upper body clothing?: Total Help from another person to put on and taking off regular lower body clothing?: Total 6 Click Score: 8   End of Session Nurse Communication: Mobility status  Activity Tolerance: Patient tolerated treatment well Patient left: in chair;with call bell/phone within reach;with nursing/sitter in room;with restraints reapplied  OT Visit Diagnosis: Unsteadiness on feet (R26.81);Other abnormalities of gait and mobility (R26.89);Muscle weakness (generalized) (M62.81);Other symptoms and signs involving cognitive function                Time: 7517-0017 OT Time Calculation (min): 30 min Charges:  OT General Charges $OT Visit: 1 Visit OT Evaluation $OT Eval Moderate Complexity: Alexis, OT/L   Acute OT Clinical Specialist Acute Rehabilitation Services Pager [phone] Office [phone]   [facility] 02/11/2019, 11:50 AM

## 2019-02-12 LAB — CBC WITH DIFFERENTIAL/PLATELET
Abs Immature Granulocytes: 0.01 10*3/uL (ref 0.00–0.07)
Basophils Absolute: 0 10*3/uL (ref 0.0–0.1)
Basophils Relative: 0 %
Eosinophils Absolute: 0 10*3/uL (ref 0.0–0.5)
Eosinophils Relative: 0 %
HCT: 39.8 % (ref 36.0–46.0)
Hemoglobin: 12.8 g/dL (ref 12.0–15.0)
Immature Granulocytes: 0 %
Lymphocytes Relative: 18 %
Lymphs Abs: 0.5 10*3/uL — ABNORMAL LOW (ref 0.7–4.0)
MCH: 29.3 pg (ref 26.0–34.0)
MCHC: 32.2 g/dL (ref 30.0–36.0)
MCV: 91.1 fL (ref 80.0–100.0)
Monocytes Absolute: 0.4 10*3/uL (ref 0.1–1.0)
Monocytes Relative: 12 %
Neutro Abs: 1.9 10*3/uL (ref 1.7–7.7)
Neutrophils Relative %: 70 %
Platelets: 118 10*3/uL — ABNORMAL LOW (ref 150–400)
RBC: 4.37 MIL/uL (ref 3.87–5.11)
RDW: 14 % (ref 11.5–15.5)
WBC: 2.8 10*3/uL — ABNORMAL LOW (ref 4.0–10.5)
nRBC: 0 % (ref 0.0–0.2)

## 2019-02-12 LAB — COMPREHENSIVE METABOLIC PANEL
ALT: 20 U/L (ref 0–44)
AST: 22 U/L (ref 15–41)
Albumin: 3.7 g/dL (ref 3.5–5.0)
Alkaline Phosphatase: 86 U/L (ref 38–126)
Anion gap: 7 (ref 5–15)
BUN: 15 mg/dL (ref 8–23)
CO2: 30 mmol/L (ref 22–32)
Calcium: 8.9 mg/dL (ref 8.9–10.3)
Chloride: 101 mmol/L (ref 98–111)
Creatinine, Ser: 0.61 mg/dL (ref 0.44–1.00)
GFR calc Af Amer: >60 mL/min (ref >60)
GFR calc non Af Amer: >60 mL/min (ref >60)
Glucose, Bld: 107 mg/dL — ABNORMAL HIGH (ref 70–99)
Potassium: 3.9 mmol/L (ref 3.5–5.1)
Sodium: 138 mmol/L (ref 135–145)
Total Bilirubin: 0.5 mg/dL (ref 0.3–1.2)
Total Protein: 6.4 g/dL — ABNORMAL LOW (ref 6.5–8.1)

## 2019-02-12 LAB — C-REACTIVE PROTEIN: CRP: 2.4 mg/dL — ABNORMAL HIGH (ref <1.0)

## 2019-02-12 LAB — D-DIMER, QUANTITATIVE: D-Dimer, Quant: 0.51 ug/mL-FEU — ABNORMAL HIGH (ref 0.00–0.50)

## 2019-02-12 LAB — FERRITIN: Ferritin: 339 ng/mL — ABNORMAL HIGH (ref 11–307)

## 2019-02-12 LAB — TSH: TSH: 0.927 u[IU]/mL (ref 0.350–4.500)

## 2019-02-12 LAB — MAGNESIUM: Magnesium: 2 mg/dL (ref 1.7–2.4)

## 2019-02-12 MED ORDER — HALOPERIDOL LACTATE 5 MG/ML IJ SOLN: 1.0000 mg | Freq: Four times a day (QID) | INTRAMUSCULAR | Status: DC | PRN

## 2019-02-12 NOTE — Progress Notes (Signed)
Spoke with daughter, Shirlean Mylar [phone], updated family member about patient, answered any questions/concerns.

## 2019-02-12 NOTE — Progress Notes (Signed)
Daughter updated 

## 2019-02-12 NOTE — Progress Notes (Addendum)
Teresa Richard  Teresa Richard  VHQ:[id number] DOB: [birth year] DOA: 02/09/2019 PCP: Tracie Harrier, MD    Brief Narrative:  78yo resident of a memory care unit w/ a hx of B12 deficiency w/ anemia, depression, CHB s/p pacer, and hypothyroidism who presented to the ED after suffering a syncopal spell while sitting in a chair. This followed a week of reported generalized weakness.  Upon arrival at [facility] the patient was agitated and fighting against medical equipment.  She was confused and not able to provide a reliable history.  Significant Events: 9/9 admit to [facility] via [facility] ED   COVID-19 specific Treatment: none  Subjective: The patient is lethargic at the time of my visit.  She cannot provide a reliable history.  There is no evidence of respiratory distress.  She appears comfortable.  There is a Actuary at her bedside.  Assessment & Plan:  SARS-CoV-2 positive At this time it appears this is essentially an incidental finding - monitor closely without specific pharmacologic therapy at this time - follow-up chest x-ray 9/10 continued to reveal no infiltrate despite volume resuscitation  Recent Labs  Lab 02/09/19 1002 02/10/19 0500 02/11/19 0203 02/12/19 0152  DDIMER  --  1.10* 0.36 0.51*  FERRITIN 103 152 246 339*  CRP  --  <0.8 1.3* 2.4*  ALT _0 Syncopal spell Likely simply related to dehydration - d/c tele - not orthostatic after hydration - increase mobility -in absence of a prominent murmur or persisting symptoms do not feel a TTE would add much to her care at this time  Pancytopenia The patient was reportedly evaluated by a Hematologist while at [facility], but an official note has not been entered -all counts on her CBC continue to trend upward, with her hemoglobin now in the normal range and WBC count approaching normal - likely this is a consequence of the viral infection itself  Reported history of chronic anemia Iron studies  suggest a combination of a very poor nutritional state as well as low iron - folic acid level is normal but B12 is borderline - supplemented B12  Borderline B12 deficiency Supplemented - will need outpatient follow-up  Hypokalemia likely due to poor dietary intake -corrected with supplementation  Borderline hypomagnesemia Likely due to poor dietary intake -corrected with supplementation  Hypothyroidism Continue home Synthroid dose -TSH at goal at 0.9  Complete heart block status post change to [employer] [id number] H Feb 2020 no evidence of an arrhythmia or pacer malfunction at this time  DVT prophylaxis: Lovenox Code Status: DNR Family Communication: spoke w/ daughter via telephone today  Disposition Plan: med/surg bed -medically stable for placement -PT/OT suggest SNF  Consultants:  none  Antimicrobials:  None  Objective: Blood pressure (!) 127/100, pulse 75, temperature 98.8 F (37.1 C), temperature source Axillary, resp. rate 16, height _1  (1.6 m), weight 47.2 kg, SpO2 98 %.  Intake/Output Summary (Last 24 hours) at 02/12/2019 0850 Last data filed at 02/11/2019 2230 Gross per 24 hour  Intake 200 ml  Output 450 ml  Net -250 ml   Filed Weights   02/10/19 1817  Weight: 47.2 kg    Examination: General: No acute respiratory distress -somewhat sedate today Lungs: Clear to auscultation bilaterally  Cardiovascular: RRR  Abdomen: NT/ND, soft, bs+, no mass  Extremities: No C/C/E B LE   CBC: Recent Labs  Lab 02/10/19 0500 02/11/19 0203 02/12/19 0152  WBC 2.7* 3.1* 2.8*  NEUTROABS 1.7 2.3 1.9  HGB 12.5 12.2 12.8  HCT 39.6 38.1 39.8  MCV 92.7 90.3 91.1  PLT 132* 125* 676*   Basic Metabolic Panel: Recent Labs  Lab 02/10/19 0500 02/11/19 0203 02/12/19 0152  NA 140 138 138  K 4.4 3.3* 3.9  CL 102 100 101  CO2 _0 GLUCOSE 85 111* 107*  BUN _1 CREATININE 0.52 0.56 0.61  CALCIUM 8.9 8.8* 8.9  MG 2.0 1.8 2.0   GFR: Estimated Creatinine  Clearance: 43.2 mL/min (by C-G formula based on SCr of 0.61 mg/dL).  Liver Function Tests: Recent Labs  Lab 02/09/19 1002 02/10/19 0500 02/11/19 0203 02/12/19 0152  AST 33 33 26 22  ALT _2 ALKPHOS 76 94 84 86  BILITOT 0.3 0.7 0.6 0.5  PROT 5.2* 6.9 6.5 6.4*  ALBUMIN 3.1* 3.9 3.6 3.7     Recent Results (from the past 240 hour(s))  SARS Coronavirus 2 [place] order, Performed in [facility] hospital lab) Nasopharyngeal Nasopharyngeal Swab     Status: Abnormal   Collection Time: 02/09/19 11:44 AM   Specimen: Nasopharyngeal Swab  Result Value Ref Range Status   SARS Coronavirus 2 POSITIVE (A) NEGATIVE Final    Comment: RESULT CALLED TO, READ BACK BY AND VERIFIED WITH: MAC BROWN 02/09/19 @ 1325  [place] (NOTE) If result is NEGATIVE SARS-CoV-2 target nucleic acids are NOT DETECTED. The SARS-CoV-2 RNA is generally detectable in upper and lower  respiratory specimens during the acute phase of infection. The lowest  concentration of SARS-CoV-2 viral copies this assay can detect is 250  copies / mL. A negative result does not preclude SARS-CoV-2 infection  and should not be used as the sole basis for treatment or other  patient management decisions.  A negative result may occur with  improper specimen collection / handling, submission of specimen other  than nasopharyngeal swab, presence of viral mutation(s) within the  areas targeted by this assay, and inadequate number of viral copies  (<250 copies / mL). A negative result must be combined with clinical  observations, patient history, and epidemiological information. If result is POSITIVE SARS-CoV-2 target nucleic acids are DETECTED. The SARS -CoV-2 RNA is generally detectable in upper and lower  respiratory specimens during the acute phase of infection.  Positive  results are indicative of active infection with SARS-CoV-2.  Clinical  correlation with patient history and other diagnostic information is  necessary to  determine patient infection status.  Positive results do  not rule out bacterial infection or co-infection with other viruses. If result is PRESUMPTIVE POSTIVE SARS-CoV-2 nucleic acids MAY BE PRESENT.   A presumptive positive result was obtained on the submitted specimen  and confirmed on repeat testing.  While 2019 novel coronavirus  (SARS-CoV-2) nucleic acids may be present in the submitted sample  additional confirmatory testing may be necessary for epidemiological  and / or clinical management purposes  to differentiate between  SARS-CoV-2 and other Sarbecovirus currently known to infect humans.  If clinically indicated additional testing with an alternate test  methodology [phone]) is advise d. The SARS-CoV-2 RNA is generally  detectable in upper and lower respiratory specimens during the acute  phase of infection. The expected result is Negative. Fact Sheet for Patients:  StrictlyIdeas.no Fact Sheet for Healthcare Providers: BankingDealers.co.za This test is not yet approved or cleared by the [place] FDA and has been authorized for detection and/or diagnosis of SARS-CoV-2 by FDA under an Emergency Use Authorization (EUA).  This EUA  will remain in effect (meaning this test can be used) for the duration of the COVID-19 declaration under Section 564(b)(1) of the Act, 21 U.S.C. section 360bbb-3(b)(1), unless the authorization is terminated or revoked sooner. Performed at [facility], [address] [place], [place] [address]   MRSA PCR Screening     Status: None   Collection Time: 02/09/19  4:56 PM   Specimen: Nasal Mucosa; Nasopharyngeal  Result Value Ref Range Status   MRSA by PCR NEGATIVE NEGATIVE Final    Comment:        The GeneXpert MRSA Assay (FDA approved for NASAL specimens only), is one component of a comprehensive MRSA colonization surveillance program. It is not intended to diagnose MRSA infection  nor to guide or monitor treatment for MRSA infections. Performed at [facility], [place] [address] [place], [place] [address]      Scheduled Meds: . aspirin EC  81 mg Oral Daily  . busPIRone  5 mg Oral BID  . enoxaparin (LOVENOX) injection  40 mg Subcutaneous Q24H  . feeding supplement (ENSURE ENLIVE)  237 mL Oral TID BM  . levothyroxine  75 mcg Oral BH-q7a  . magnesium oxide  400 mg Oral Daily  . Melatonin  5 mg Oral QHS  . multivitamin with minerals  1 tablet Oral Daily  . QUEtiapine  50 mg Oral 3 times per day  . senna  1 tablet Oral BID      LOS: 3 days   Cherene Altes, MD [facility] Office  [phone] Pager - Text Page per Amion  If 7PM-7AM, please contact night-coverage per Amion 02/12/2019, 8:50 AM

## 2019-02-13 MED ORDER — LORAZEPAM 2 MG/ML IJ SOLN
0.5000 mg | Freq: Four times a day (QID) | INTRAMUSCULAR | Status: DC | PRN
  Administered 2019-02-14 (×2): 0.5 mg via INTRAVENOUS
  Filled 2019-02-13 (×2): qty 1

## 2019-02-13 MED ORDER — DEXTROSE-NACL 5-0.9 % IV SOLN
INTRAVENOUS | Status: DC
  Administered 2019-02-13 – 2019-02-17 (×3): via INTRAVENOUS

## 2019-02-13 NOTE — Progress Notes (Signed)
[place]  Teresa Richard  SAY:[id number] DOB: 1941-05-27 DOA: 02/09/2019 PCP: Tracie Harrier, MD    Brief Narrative:  78yo resident of a memory care unit w/ a hx of B12 deficiency w/ anemia, depression, CHB s/p pacer, and hypothyroidism who presented to the ED after suffering a syncopal spell while sitting in a chair. This followed a week of reported generalized weakness.  Upon arrival at [facility] the patient was agitated and fighting against medical equipment.  She was confused and not able to provide a reliable history.  Significant Events: 9/9 admit to [facility] via [facility] ED   COVID-19 specific Treatment: none  Subjective: Withdrawn but appears comfortable.  No respiratory distress or uncontrolled pain evident  Assessment & Plan:  SARS-CoV-2 positive At this time it appears this is essentially an incidental finding - monitor closely without specific pharmacologic therapy - follow-up chest x-ray 9/10 continued to reveal no infiltrate despite volume resuscitation  Syncopal spell Likely simply related to dehydration - not orthostatic after hydration - increase mobility -in absence of a prominent murmur or persisting symptoms do not feel a TTE would add much to her care at this time  Pancytopenia The patient was reportedly evaluated by a Hematologist while at [facility], but an official note has not been entered -all counts on her CBC continue to trend upward, with her hemoglobin now in the normal range and WBC count approaching normal - likely this is a consequence of the viral infection itself - will need f/u as outpt once fully recovered   Reported history of chronic anemia Iron studies suggest a combination of a very poor nutritional state as well as low iron - folic acid level is normal but B12 is borderline - supplemented B12  Borderline B12 deficiency Supplemented - will need outpatient follow-up  Hypokalemia likely due to poor dietary intake  -corrected with supplementation  Borderline hypomagnesemia Likely due to poor dietary intake -corrected with supplementation  Hypothyroidism Continue home Synthroid dose -TSH at goal at 0.9  Complete heart block status post change to Medtronic [id number] H Feb 2020 no evidence of an arrhythmia or pacer malfunction at this time  DVT prophylaxis: Lovenox Code Status: DNR Family Communication:  Disposition Plan: med/surg bed -medically stable for placement -PT/OT suggest SNF  Consultants:  none  Antimicrobials:  None  Objective: Blood pressure 126/60, pulse 77, temperature 98 F (36.7 C), temperature source Axillary, resp. rate 16, height _0  (1.6 m), weight 47.2 kg, SpO2 99 %.  Intake/Output Summary (Last 24 hours) at 02/13/2019 0900 Last data filed at 02/12/2019 1700 Gross per 24 hour  Intake 130 ml  Output -  Net 130 ml   Filed Weights   02/10/19 1817  Weight: 47.2 kg    Examination: General: No acute respiratory distress Lungs: Clear to auscultation B Cardiovascular: RRR  - no M  Abdomen: NT/ND, soft, bs+, no mass  Extremities: No C/C/E bilateral lower extremities  CBC: Recent Labs  Lab 02/10/19 0500 02/11/19 0203 02/12/19 0152  WBC 2.7* 3.1* 2.8*  NEUTROABS 1.7 2.3 1.9  HGB 12.5 12.2 12.8  HCT 39.6 38.1 39.8  MCV 92.7 90.3 91.1  PLT 132* 125* 254*   Basic Metabolic Panel: Recent Labs  Lab 02/10/19 0500 02/11/19 0203 02/12/19 0152  NA 140 138 138  K 4.4 3.3* 3.9  CL 102 100 101  CO2 _1 GLUCOSE 85 111* 107*  BUN _2 CREATININE 0.52 0.56 0.61  CALCIUM 8.9  8.8* 8.9  MG 2.0 1.8 2.0   GFR: Estimated Creatinine Clearance: 43.2 mL/min (by C-G formula based on SCr of 0.61 mg/dL).  Liver Function Tests: Recent Labs  Lab 02/09/19 1002 02/10/19 0500 02/11/19 0203 02/12/19 0152  AST 33 33 26 22  ALT _0 ALKPHOS 76 94 84 86  BILITOT 0.3 0.7 0.6 0.5  PROT 5.2* 6.9 6.5 6.4*  ALBUMIN 3.1* 3.9 3.6 3.7     Recent  Results (from the past 240 hour(s))  SARS Coronavirus 2 [facility] order, Performed in [facility] hospital lab) Nasopharyngeal Nasopharyngeal Swab     Status: Abnormal   Collection Time: 02/09/19 11:44 AM   Specimen: Nasopharyngeal Swab  Result Value Ref Range Status   SARS Coronavirus 2 POSITIVE (A) NEGATIVE Final    Comment: RESULT CALLED TO, READ BACK BY AND VERIFIED WITH: MAC BROWN 02/09/19 @ 1325  [place] (NOTE) If result is NEGATIVE SARS-CoV-2 target nucleic acids are NOT DETECTED. The SARS-CoV-2 RNA is generally detectable in upper and lower  respiratory specimens during the acute phase of infection. The lowest  concentration of SARS-CoV-2 viral copies this assay can detect is 250  copies / mL. A negative result does not preclude SARS-CoV-2 infection  and should not be used as the sole basis for treatment or other  patient management decisions.  A negative result may occur with  improper specimen collection / handling, submission of specimen other  than nasopharyngeal swab, presence of viral mutation(s) within the  areas targeted by this assay, and inadequate number of viral copies  (<250 copies / mL). A negative result must be combined with clinical  observations, patient history, and epidemiological information. If result is POSITIVE SARS-CoV-2 target nucleic acids are DETECTED. The SARS -CoV-2 RNA is generally detectable in upper and lower  respiratory specimens during the acute phase of infection.  Positive  results are indicative of active infection with SARS-CoV-2.  Clinical  correlation with patient history and other diagnostic information is  necessary to determine patient infection status.  Positive results do  not rule out bacterial infection or co-infection with other viruses. If result is PRESUMPTIVE POSTIVE SARS-CoV-2 nucleic acids MAY BE PRESENT.   A presumptive positive result was obtained on the submitted specimen  and confirmed on repeat testing.  While 2019 novel  coronavirus  (SARS-CoV-2) nucleic acids may be present in the submitted sample  additional confirmatory testing may be necessary for epidemiological  and / or clinical management purposes  to differentiate between  SARS-CoV-2 and other Sarbecovirus currently known to infect humans.  If clinically indicated additional testing with an alternate test  methodology [phone]) is advise d. The SARS-CoV-2 RNA is generally  detectable in upper and lower respiratory specimens during the acute  phase of infection. The expected result is Negative. Fact Sheet for Patients:  StrictlyIdeas.no Fact Sheet for Healthcare Providers: BankingDealers.co.za This test is not yet approved or cleared by the [place] FDA and has been authorized for detection and/or diagnosis of SARS-CoV-2 by FDA under an Emergency Use Authorization (EUA).  This EUA will remain in effect (meaning this test can be used) for the duration of the COVID-19 declaration under Section 564(b)(1) of the Act, 21 U.S.C. section 360bbb-3(b)(1), unless the authorization is terminated or revoked sooner. Performed at [facility], [address], [place], [place] [address]   MRSA PCR Screening     Status: None   Collection Time: 02/09/19  4:56 PM   Specimen: Nasal Mucosa; Nasopharyngeal  Result Value Ref Range Status   MRSA by PCR NEGATIVE NEGATIVE Final    Comment:        The GeneXpert MRSA Assay (FDA approved for NASAL specimens only), is one component of a comprehensive MRSA colonization surveillance program. It is not intended to diagnose MRSA infection nor to guide or monitor treatment for MRSA infections. Performed at [facility], [place] [address], [place], [place] [address]      Scheduled Meds: . aspirin EC  81 mg Oral Daily  . busPIRone  5 mg Oral BID  . enoxaparin (LOVENOX) injection  40 mg Subcutaneous Q24H  . feeding supplement (ENSURE  ENLIVE)  237 mL Oral TID BM  . levothyroxine  75 mcg Oral BH-q7a  . magnesium oxide  400 mg Oral Daily  . Melatonin  5 mg Oral QHS  . multivitamin with minerals  1 tablet Oral Daily  . QUEtiapine  50 mg Oral 3 times per day  . senna  1 tablet Oral BID      LOS: 4 days   Cherene Altes, MD [facility] Office  [phone] Pager - Text Page per Amion  If 7PM-7AM, please contact night-coverage per Amion 02/13/2019, 9:00 AM

## 2019-02-13 NOTE — Progress Notes (Signed)
Daughter updated 

## 2019-02-13 NOTE — Progress Notes (Signed)
[facility] notified about pt low urine output. Pt bladder scanned and read 60ml. Pt has been drinking little today and had 2 magic cups with meals. MD ordering IV fluids. Will continue to monitor pt

## 2019-02-14 LAB — CBC
HCT: 40.8 % (ref 36.0–46.0)
Hemoglobin: 12.9 g/dL (ref 12.0–15.0)
MCH: 29.3 pg (ref 26.0–34.0)
MCHC: 31.6 g/dL (ref 30.0–36.0)
MCV: 92.5 fL (ref 80.0–100.0)
Platelets: 133 10*3/uL — ABNORMAL LOW (ref 150–400)
RBC: 4.41 MIL/uL (ref 3.87–5.11)
RDW: 13.9 % (ref 11.5–15.5)
WBC: 2.4 10*3/uL — ABNORMAL LOW (ref 4.0–10.5)
nRBC: 0 % (ref 0.0–0.2)

## 2019-02-14 LAB — BASIC METABOLIC PANEL
Anion gap: 7 (ref 5–15)
BUN: 34 mg/dL — ABNORMAL HIGH (ref 8–23)
CO2: 33 mmol/L — ABNORMAL HIGH (ref 22–32)
Calcium: 9.4 mg/dL (ref 8.9–10.3)
Chloride: 104 mmol/L (ref 98–111)
Creatinine, Ser: 0.67 mg/dL (ref 0.44–1.00)
GFR calc Af Amer: >60 mL/min (ref >60)
GFR calc non Af Amer: >60 mL/min (ref >60)
Glucose, Bld: 111 mg/dL — ABNORMAL HIGH (ref 70–99)
Potassium: 4.7 mmol/L (ref 3.5–5.1)
Sodium: 144 mmol/L (ref 135–145)

## 2019-02-14 NOTE — Progress Notes (Signed)
Daughter updated 

## 2019-02-14 NOTE — Progress Notes (Signed)
[place]  Rinda Rollyson  LAG:[id number] DOB: 05-05-1941 DOA: 02/09/2019 PCP: Tracie Harrier, MD    Brief Narrative:  78yo resident of a memory care unit w/ a hx of B12 deficiency w/ anemia, depression, CHB s/p pacer, and hypothyroidism who presented to the ED after suffering a syncopal spell while sitting in a chair. This followed a week of reported generalized weakness.  Upon arrival at [facility] the patient was agitated and fighting against medical equipment. She was confused and not able to provide a reliable history.  Significant Events: 9/9 admit to [facility] via [facility] ED - SARS-CoV-2 testing +  COVID-19 specific Treatment: none  Subjective: Very poor oral intake 9/13 requiring supplementation with IV fluids.  Vital signs remain stable. Pt does not appear to be in acute distress or discomfort.   Assessment & Plan:  SARS-CoV-2 positive At this time it appears this is essentially an incidental finding - monitor closely without specific pharmacologic therapy - follow-up chest x-ray 9/10 continued to reveal no infiltrate despite volume resuscitation  Syncopal spell Likely simply related to dehydration - not orthostatic after hydration - increase mobility -in absence of a prominent murmur or persisting symptoms do not feel a TTE would add much to her care at this time  Pancytopenia The patient was reportedly evaluated by a Hematologist while at [facility], but an official note has not been entered -all counts on her CBC continue to trend upward, with her hemoglobin now in the normal range and WBC count approaching normal - likely this is a consequence of the viral infection itself - will need f/u as outpt once fully recovered   Reported history of chronic anemia Iron studies suggest a combination of a very poor nutritional state as well as low iron - folic acid level is normal but B12 is borderline - supplemented B12  Borderline B12 deficiency Supplemented -  will need outpatient follow-up  Hypokalemia likely due to poor dietary intake - corrected with supplementation  Borderline hypomagnesemia Likely due to poor dietary intake -corrected with supplementation  Hypothyroidism Continue home Synthroid dose -TSH at goal at 0.9  Complete heart block status post change to Medtronic [id number] H Feb 2020 no evidence of an arrhythmia or pacer malfunction at this time  DVT prophylaxis: Lovenox Code Status: DNR Family Communication:  Disposition Plan: med/surg bed -medically stable for placement - awaiting SNF placement arrangement   Consultants:  none  Antimicrobials:  None  Objective: Blood pressure 126/69, pulse 62, temperature 97.6 F (36.4 C), resp. rate 16, height _0  (1.6 m), weight 47.2 kg, SpO2 100 %.  Intake/Output Summary (Last 24 hours) at 02/14/2019 0850 Last data filed at 02/13/2019 1753 Gross per 24 hour  Intake 615.48 ml  Output 100 ml  Net 515.48 ml   Filed Weights   02/10/19 1817  Weight: 47.2 kg    Examination: General: No acute respiratory distress Lungs: Clear to auscultation B - no wheeze  Cardiovascular: RRR Abdomen: NT/ND, soft, bs+, no mass  Extremities: No C/C/E B LE   CBC: Recent Labs  Lab 02/10/19 0500 02/11/19 0203 02/12/19 0152 02/14/19 0335  WBC 2.7* 3.1* 2.8* 2.4*  NEUTROABS 1.7 2.3 1.9  --   HGB 12.5 12.2 12.8 12.9  HCT 39.6 38.1 39.8 40.8  MCV 92.7 90.3 91.1 92.5  PLT 132* 125* 118* 370*   Basic Metabolic Panel: Recent Labs  Lab 02/10/19 0500 02/11/19 0203 02/12/19 0152 02/14/19 0335  NA 140 138 138 144  K  4.4 3.3* 3.9 4.7  CL 102 100 101 104  CO2 _0 33*  GLUCOSE 85 111* 107* 111*  BUN _1 34*  CREATININE 0.52 0.56 0.61 0.67  CALCIUM 8.9 8.8* 8.9 9.4  MG 2.0 1.8 2.0  --    GFR: Estimated Creatinine Clearance: 43.2 mL/min (by C-G formula based on SCr of 0.67 mg/dL).  Liver Function Tests: Recent Labs  Lab 02/09/19 1002 02/10/19 0500 02/11/19 0203  02/12/19 0152  AST 33 33 26 22  ALT _2 ALKPHOS 76 94 84 86  BILITOT 0.3 0.7 0.6 0.5  PROT 5.2* 6.9 6.5 6.4*  ALBUMIN 3.1* 3.9 3.6 3.7     Recent Results (from the past 240 hour(s))  SARS Coronavirus 2 [facility] order, Performed in [facility] hospital lab) Nasopharyngeal Nasopharyngeal Swab     Status: Abnormal   Collection Time: 02/09/19 11:44 AM   Specimen: Nasopharyngeal Swab  Result Value Ref Range Status   SARS Coronavirus 2 POSITIVE (A) NEGATIVE Final    Comment: RESULT CALLED TO, READ BACK BY AND VERIFIED WITH: MAC BROWN 02/09/19 @ 1325  [place] (NOTE) If result is NEGATIVE SARS-CoV-2 target nucleic acids are NOT DETECTED. The SARS-CoV-2 RNA is generally detectable in upper and lower  respiratory specimens during the acute phase of infection. The lowest  concentration of SARS-CoV-2 viral copies this assay can detect is 250  copies / mL. A negative result does not preclude SARS-CoV-2 infection  and should not be used as the sole basis for treatment or other  patient management decisions.  A negative result may occur with  improper specimen collection / handling, submission of specimen other  than nasopharyngeal swab, presence of viral mutation(s) within the  areas targeted by this assay, and inadequate number of viral copies  (<250 copies / mL). A negative result must be combined with clinical  observations, patient history, and epidemiological information. If result is POSITIVE SARS-CoV-2 target nucleic acids are DETECTED. The SARS -CoV-2 RNA is generally detectable in upper and lower  respiratory specimens during the acute phase of infection.  Positive  results are indicative of active infection with SARS-CoV-2.  Clinical  correlation with patient history and other diagnostic information is  necessary to determine patient infection status.  Positive results do  not rule out bacterial infection or co-infection with other viruses. If result is PRESUMPTIVE POSTIVE  SARS-CoV-2 nucleic acids MAY BE PRESENT.   A presumptive positive result was obtained on the submitted specimen  and confirmed on repeat testing.  While 2019 novel coronavirus  (SARS-CoV-2) nucleic acids may be present in the submitted sample  additional confirmatory testing may be necessary for epidemiological  and / or clinical management purposes  to differentiate between  SARS-CoV-2 and other Sarbecovirus currently known to infect humans.  If clinically indicated additional testing with an alternate test  methodology [phone]) is advise d. The SARS-CoV-2 RNA is generally  detectable in upper and lower respiratory specimens during the acute  phase of infection. The expected result is Negative. Fact Sheet for Patients:  StrictlyIdeas.no Fact Sheet for Healthcare Providers: BankingDealers.co.za This test is not yet approved or cleared by the [place] FDA and has been authorized for detection and/or diagnosis of SARS-CoV-2 by FDA under an Emergency Use Authorization (EUA).  This EUA will remain in effect (meaning this test can be used) for the duration of the COVID-19 declaration under Section 564(b)(1) of the Act, 21 U.S.C. section 360bbb-3(b)(1), unless the authorization is  terminated or revoked sooner. Performed at [facility], [address] [place], [place] [address]   MRSA PCR Screening     Status: None   Collection Time: 02/09/19  4:56 PM   Specimen: Nasal Mucosa; Nasopharyngeal  Result Value Ref Range Status   MRSA by PCR NEGATIVE NEGATIVE Final    Comment:        The GeneXpert MRSA Assay (FDA approved for NASAL specimens only), is one component of a comprehensive MRSA colonization surveillance program. It is not intended to diagnose MRSA infection nor to guide or monitor treatment for MRSA infections. Performed at [facility], [place] [address], [place], [place] [address]       Scheduled Meds: . aspirin EC  81 mg Oral Daily  . busPIRone  5 mg Oral BID  . enoxaparin (LOVENOX) injection  40 mg Subcutaneous Q24H  . feeding supplement (ENSURE ENLIVE)  237 mL Oral TID BM  . levothyroxine  75 mcg Oral BH-q7a  . magnesium oxide  400 mg Oral Daily  . Melatonin  5 mg Oral QHS  . multivitamin with minerals  1 tablet Oral Daily  . QUEtiapine  50 mg Oral 3 times per day  . senna  1 tablet Oral BID    . dextrose 5 % and 0.9% NaCl 60 mL/hr at 02/13/19 1753    LOS: 5 days   Cherene Altes, MD [facility] Office  [phone] Pager - Text Page per Amion  If 7PM-7AM, please contact night-coverage per Amion 02/14/2019, 8:50 AM

## 2019-02-14 NOTE — Progress Notes (Signed)
Physical Therapy Treatment Patient Details Name: Teresa PavlovLinda Richard MRN: [id number] DOB: 09/25/1940 Today's Date: 02/14/2019    History of Present Illness 78 y.o. w/ a hx of B12 deficiency w/ anemia, depression, CHB s/p pacer, and hypothyroidism who presented to the ED from ALF after suffering a syncopal spell while sitting in a chair. This followed a week of reported generalized weakness    PT Comments    Patient restless in bed, legs moving, hArms waving. [facility] pt to sitting [facility]. Patient did begin to cry. Patient quieted down, left in bed with hymns playing on computer.Pt. on RA, SPO2 955.   Follow Up Recommendations  SNF(unsure if pt can participate)     Equipment Recommendations  None recommended by PT    Recommendations for Other Services       Precautions / Restrictions Precautions Precautions: Fall Precaution Comments: At risk for skin breakdown, incontinent    Mobility  Bed Mobility Overal bed mobility: Needs Assistance Bed Mobility: Supine to Sit;Sit to Supine     Supine to sit: Max assist Sit to supine: Max assist   General bed mobility comments: assist with legs, and trunk, pt. did not really assist with any mobility,  Transfers Overall transfer level: Needs assistance   Transfers: Sit to/from Stand;Stand Pivot Transfers Sit to Stand: Max assist Stand pivot transfers: Max assist       General transfer comment: bear hug stand and [facility] then back to bed, mits in place.  Ambulation/Gait                 Stairs             Wheelchair Mobility    Modified Rankin (Stroke Patients Only)       Balance Overall balance assessment: Needs assistance Sitting-balance support: Bilateral upper extremity supported Sitting balance-Leahy Scale: Poor Sitting balance - Comments: once sitting upright, did sit at mid line                                    Cognition Arousal/Alertness:  Lethargic Behavior During Therapy: Restless;Flat affect;Impulsive Overall Cognitive Status: Impaired/Different from baseline Area of Impairment: Orientation;Attention;Memory;Following commands;Safety/judgement;Awareness;Problem solving                 Orientation Level: Disoriented to;Place;Time;Situation Current Attention Level: Divided Memory: Decreased short-term memory   Safety/Judgement: Decreased awareness of safety;Decreased awareness of deficits Awareness: Intellectual Problem Solving: Slow processing;Decreased initiation;Difficulty sequencing;Requires verbal cues;Requires tactile cues General Comments: pt does not follow commands, began to cry, unable to understand her verbalizations. [facility] and [facility] to bed, pt. was calm, hymns playing on computer      Exercises      General Comments        Pertinent Vitals/Pain Faces Pain Scale: Hurts a little bit Pain Location: general discomfort Pain Descriptors / Indicators: Guarding Pain Intervention(s): Monitored during session    Home Living                      Prior Function            PT Goals (current goals can now be found in the care plan section) Progress towards PT goals: Progressing toward goals    Frequency    Min 2X/week      PT Plan Current plan remains appropriate    Co-evaluation  AM-PAC PT "6 Clicks" Mobility   Outcome Measure  Help needed turning from your back to your side while in a flat bed without using bedrails?: Total Help needed moving from lying on your back to sitting on the side of a flat bed without using bedrails?: Total Help needed moving to and from a bed to a chair (including a wheelchair)?: Total Help needed standing up from a chair using your arms (e.g., wheelchair or bedside chair)?: Total Help needed to walk in hospital room?: Total Help needed climbing 3-5 steps with a railing? : Total 6 Click Score: 6    End of Session    Activity Tolerance: Patient limited by lethargy;Patient limited by fatigue Patient left: in bed;with call bell/phone within reach;with nursing/sitter in room(telesitter) Nurse Communication: Mobility status PT Visit Diagnosis: Unsteadiness on feet (R26.81);History of falling (Z91.81)     Time: 9407-6808 PT Time Calculation (min) (ACUTE ONLY): 23 min  Charges:  $Therapeutic Activity: 23-37 mins                     Tresa Endo PT Acute Rehabilitation Services Pager [phone] Office [phone]    Claretha Cooper 02/14/2019, 2:16 PM

## 2019-02-15 LAB — SARS CORONAVIRUS 2 (TAT 6-24 HRS): SARS Coronavirus 2: POSITIVE — AB

## 2019-02-15 MED ORDER — CYANOCOBALAMIN 1000 MCG/ML IJ SOLN
1000.0000 ug | Freq: Once | INTRAMUSCULAR | Status: AC
  Administered 2019-02-15: 1000 ug via SUBCUTANEOUS
  Filled 2019-02-15: qty 1

## 2019-02-15 MED ORDER — LORAZEPAM 2 MG/ML IJ SOLN
0.5000 mg | Freq: Four times a day (QID) | INTRAMUSCULAR | Status: DC | PRN
  Administered 2019-02-16: 0.5 mg via INTRAVENOUS
  Filled 2019-02-15: qty 1

## 2019-02-15 NOTE — Care Management Important Message (Signed)
Important Message  Patient Details  Name: Teresa Richard MRN: [id number] Date of Birth: 1941/04/25   Medicare Important Message Given:  Yes - Important Message mailed due to current National Emergency   Verbal consent obtained due to current National Emergency  Relationship to patient: Child Contact Name: Teresa Richard Call Date: 02/15/19  Time: 1239 Phone: [phone] Outcome: Spoke with contact Important Message mailed to: Other (must enter comment)(emailed to: [email or web address])   Yanci Bachtell P Traven Davids 02/15/2019, 12:40 PM

## 2019-02-15 NOTE — Progress Notes (Addendum)
Teresa  Victory Richard  VHQ:[id number] DOB: 1940-11-10 DOA: 02/09/2019 PCP: Tracie Harrier, MD    Brief Narrative:  78yo resident of a memory care unit w/ a hx of B12 deficiency w/ anemia, depression, CHB s/p pacer, and hypothyroidism who presented to the ED after suffering a syncopal spell while sitting in a chair. This followed a week of reported generalized weakness.  Upon arrival at [facility] the patient was agitated and fighting against medical equipment. She was confused and not able to provide a reliable history.  Significant Events: 9/9 admit to [facility] via [facility] ED - SARS-CoV-2 testing +  COVID-19 specific Treatment: none  Subjective: Very poor oral intake 9/13 requiring supplementation with IV fluids.  Vital signs remain stable. Pt does not appear to be in acute distress or discomfort. Remains minimally interactive, but at times agitated. F/U SARS-CoV-2 testing has returned positive.   Assessment & Plan:  SARS-CoV-2 positive At this time it appears this is essentially an incidental finding - monitor closely without specific pharmacologic therapy - follow-up chest x-ray 9/10 continued to reveal no infiltrate despite volume resuscitation -clinically still no evidence of an active COVID syndrome  Syncopal spell Likely simply related to dehydration - not orthostatic after hydration - increase mobility - in absence of a prominent murmur or persisting symptoms do not feel a TTE would add much to her care at this time  Pancytopenia The patient was reportedly evaluated by a Hematologist while at [facility], but an official note has not been entered -all counts on her CBC continue to trend upward, with her hemoglobin now in the normal range and WBC count approaching normal - likely this is a consequence of the viral infection itself - will need f/u as outpt once fully recovered   Reported history of chronic anemia Iron studies suggest a combination of a very  poor nutritional state as well as low iron - folic acid level is normal but B12 is borderline - supplemented B12  Borderline B12 deficiency Supplemented - will need outpatient follow-up  Hypokalemia likely due to poor dietary intake - corrected with supplementation  Borderline hypomagnesemia Likely due to poor dietary intake -corrected with supplementation  Hypothyroidism Continue home Synthroid dose -TSH at goal at 0.9  Complete heart block status post change to [employer] [id number] H Feb 2020 no evidence of an arrhythmia or pacer malfunction at this time  DVT prophylaxis: Lovenox Code Status: DNR Family Communication: I called and spoke with the patient's daughter via phone at length Disposition Plan: med/surg bed -medically stable for placement - awaiting SNF placement arrangement which is proving difficult due to COVID+ status -the daughter informs me that the patient's facility has multiple positive patients and is working with the health department to make arrangements to potentially take their patients back post discharge regardless of positive COVID status  Consultants:  none  Antimicrobials:  None  Objective: Blood pressure 136/70, pulse 76, temperature 98.5 F (36.9 C), resp. rate 20, height _0  (1.6 m), weight 47.2 kg, SpO2 100 %.  Intake/Output Summary (Last 24 hours) at 02/15/2019 0840 Last data filed at 02/15/2019 0700 Gross per 24 hour  Intake 2471.19 ml  Output 400 ml  Net 2071.19 ml   Filed Weights   02/10/19 1817  Weight: 47.2 kg    Examination: General: No acute respiratory distress -lethargic Lungs: Clear to auscultation B  Cardiovascular: RRR without murmur Abdomen: NT/ND, soft, bs+, no mass  Extremities: No C/C/E lower extremities  CBC:  Recent Labs  Lab 02/10/19 0500 02/11/19 0203 02/12/19 0152 02/14/19 0335  WBC 2.7* 3.1* 2.8* 2.4*  NEUTROABS 1.7 2.3 1.9  --   HGB 12.5 12.2 12.8 12.9  HCT 39.6 38.1 39.8 40.8  MCV 92.7 90.3 91.1  92.5  PLT 132* 125* 118* 001*   Basic Metabolic Panel: Recent Labs  Lab 02/10/19 0500 02/11/19 0203 02/12/19 0152 02/14/19 0335  NA 140 138 138 144  K 4.4 3.3* 3.9 4.7  CL 102 100 101 104  CO2 _0 33*  GLUCOSE 85 111* 107* 111*  BUN _1 34*  CREATININE 0.52 0.56 0.61 0.67  CALCIUM 8.9 8.8* 8.9 9.4  MG 2.0 1.8 2.0  --    GFR: Estimated Creatinine Clearance: 43.2 mL/min (by C-G formula based on SCr of 0.67 mg/dL).  Liver Function Tests: Recent Labs  Lab 02/09/19 1002 02/10/19 0500 02/11/19 0203 02/12/19 0152  AST 33 33 26 22  ALT _2 ALKPHOS 76 94 84 86  BILITOT 0.3 0.7 0.6 0.5  PROT 5.2* 6.9 6.5 6.4*  ALBUMIN 3.1* 3.9 3.6 3.7     Recent Results (from the past 240 hour(s))  SARS Coronavirus 2 [facility] order, Performed in [facility] hospital lab) Nasopharyngeal Nasopharyngeal Swab     Status: Abnormal   Collection Time: 02/09/19 11:44 AM   Specimen: Nasopharyngeal Swab  Result Value Ref Range Status   SARS Coronavirus 2 POSITIVE (A) NEGATIVE Final    Comment: RESULT CALLED TO, READ BACK BY AND VERIFIED WITH: MAC BROWN 02/09/19 @ 1325  [place] (NOTE) If result is NEGATIVE SARS-CoV-2 target nucleic acids are NOT DETECTED. The SARS-CoV-2 RNA is generally detectable in upper and lower  respiratory specimens during the acute phase of infection. The lowest  concentration of SARS-CoV-2 viral copies this assay can detect is 250  copies / mL. A negative result does not preclude SARS-CoV-2 infection  and should not be used as the sole basis for treatment or other  patient management decisions.  A negative result may occur with  improper specimen collection / handling, submission of specimen other  than nasopharyngeal swab, presence of viral mutation(s) within the  areas targeted by this assay, and inadequate number of viral copies  (<250 copies / mL). A negative result must be combined with clinical  observations, patient history, and epidemiological  information. If result is POSITIVE SARS-CoV-2 target nucleic acids are DETECTED. The SARS -CoV-2 RNA is generally detectable in upper and lower  respiratory specimens during the acute phase of infection.  Positive  results are indicative of active infection with SARS-CoV-2.  Clinical  correlation with patient history and other diagnostic information is  necessary to determine patient infection status.  Positive results do  not rule out bacterial infection or co-infection with other viruses. If result is PRESUMPTIVE POSTIVE SARS-CoV-2 nucleic acids MAY BE PRESENT.   A presumptive positive result was obtained on the submitted specimen  and confirmed on repeat testing.  While 2019 novel coronavirus  (SARS-CoV-2) nucleic acids may be present in the submitted sample  additional confirmatory testing may be necessary for epidemiological  and / or clinical management purposes  to differentiate between  SARS-CoV-2 and other Sarbecovirus currently known to infect humans.  If clinically indicated additional testing with an alternate test  methodology [phone]) is advise d. The SARS-CoV-2 RNA is generally  detectable in upper and lower respiratory specimens during the acute  phase of infection. The expected result is Negative. Fact Sheet  for Patients:  StrictlyIdeas.no Fact Sheet for Healthcare Providers: BankingDealers.co.za This test is not yet approved or cleared by the [place] FDA and has been authorized for detection and/or diagnosis of SARS-CoV-2 by FDA under an Emergency Use Authorization (EUA).  This EUA will remain in effect (meaning this test can be used) for the duration of the COVID-19 declaration under Section 564(b)(1) of the Act, 21 U.S.C. section 360bbb-3(b)(1), unless the authorization is terminated or revoked sooner. Performed at [facility], [address] [place], [place] [address]   MRSA PCR Screening      Status: None   Collection Time: 02/09/19  4:56 PM   Specimen: Nasal Mucosa; Nasopharyngeal  Result Value Ref Range Status   MRSA by PCR NEGATIVE NEGATIVE Final    Comment:        The GeneXpert MRSA Assay (FDA approved for NASAL specimens only), is one component of a comprehensive MRSA colonization surveillance program. It is not intended to diagnose MRSA infection nor to guide or monitor treatment for MRSA infections. Performed at [facility], [place] [address], [place], [place] [address]   SARS CORONAVIRUS 2 (TAT 6-24 HRS) Nasopharyngeal Nasopharyngeal Swab     Status: Abnormal   Collection Time: 02/14/19  3:47 PM   Specimen: Nasopharyngeal Swab  Result Value Ref Range Status   SARS Coronavirus 2 POSITIVE (A) NEGATIVE Final    Comment: (NOTE) SARS-CoV-2 target nucleic acids are DETECTED. The SARS-CoV-2 RNA is generally detectable in upper and lower respiratory specimens during the acute phase of infection. Positive results are indicative of active infection with SARS-CoV-2. Clinical  correlation with patient history and other diagnostic information is necessary to determine patient infection status. Positive results do  not rule out bacterial infection or co-infection with other viruses. The expected result is Negative. Fact Sheet for Patients: SugarRoll.be Fact Sheet for Healthcare Providers: https://www.woods-mathews.com/ This test is not yet approved or cleared by the [place] FDA and  has been authorized for detection and/or diagnosis of SARS-CoV-2 by FDA under an Emergency Use Authorization (EUA). This EUA will remain  in effect (meaning this test can be used) for the duration of the COVID-19 declaration under Section 564(b)(1) of the Act, 21 U.S.C.  section 360bbb-3(b)(1), unless the authorization is terminated or revoked sooner. Performed at [facility], [place] [address] [place], [place] [address]       Scheduled Meds: . aspirin EC  81 mg Oral Daily  . busPIRone  5 mg Oral BID  . enoxaparin (LOVENOX) injection  40 mg Subcutaneous Q24H  . feeding supplement (ENSURE ENLIVE)  237 mL Oral TID BM  . levothyroxine  75 mcg Oral BH-q7a  . magnesium oxide  400 mg Oral Daily  . Melatonin  5 mg Oral QHS  . multivitamin with minerals  1 tablet Oral Daily  . QUEtiapine  50 mg Oral 3 times per day  . senna  1 tablet Oral BID    . dextrose 5 % and 0.9% NaCl 50 mL/hr at 02/14/19 1557    LOS: 6 days   Cherene Altes, MD [facility] Office  [phone] Pager - Text Page per Amion  If 7PM-7AM, please contact night-coverage per Amion 02/15/2019, 8:40 AM

## 2019-02-15 NOTE — Progress Notes (Addendum)
0700 bedside report received, introduced myself to pt and updated the board  0800 assessment and vs completed and charted  0900 gave morning meds and assisted to the bsc  1000 called daughter and gave updated  1200 pt is not eating well, encouraging fluids and ensure  1400 pt is getting wrestles  1500 gave meds

## 2019-02-15 NOTE — Care Management (Signed)
Case manager spoke with patient's daughter, Juanito Doom [phone], concerning patient's discharge plan. Shirlean Mylar says she spoke with Ed Weeks at [facility] who says that he has a meeting this AM concerning patient's return. Case manager called and left message for Mr. Suella Grove [phone] and requested a return call. Shirlean Mylar prefers that her mom return to [facility] rather than have to adjust to another setting. Case manager will continue to follow.    Ricki Miller, RN BSN Case Manager [phone]

## 2019-02-15 NOTE — Progress Notes (Signed)
Occupational Therapy Treatment Patient Details Name: Teresa PavlovLinda Vos MRN: [id number] DOB: 10/12/1940 Today's Date: 02/15/2019    History of present illness 78 y.o. w/ a hx of B12 deficiency w/ anemia, depression, CHB s/p pacer, and hypothyroidism who presented to the ED from ALF after suffering a syncopal spell while sitting in a chair. This followed a week of reported generalized weakness   OT comments  Pt restless. Pt stating she had a [facility] and required Max A for hygiene. Then took a couple of steps to chair - attempted to have pt drink - pt biting straw and demonstrated poor PO intake. Brushed her teeth with mod A. Pt lethargic - possibly due to medication. Discussed with nsg - pt was ambulatory PTA, therefore, when pt is restless, stand at bedside and/or attempt to toilet using BSC to reduce restlessness. Will need SNF. Will continue to follow.   Follow Up Recommendations  SNF;Supervision/Assistance - 24 hour    Equipment Recommendations  None recommended by OT    Recommendations for Other Services      Precautions / Restrictions Precautions Precautions: Fall       Mobility Bed Mobility Overal bed mobility: Needs Assistance Bed Mobility: Supine to Sit     Supine to sit: Min assist     General bed mobility comments: Pt had been trying to get OOB per NT  Transfers Overall transfer level: Needs assistance   Transfers: Sit to/from Stand;Stand Pivot Transfers Sit to Stand: Max assist Stand pivot transfers: Max assist       General transfer comment: kyphotic; given step cues; "step step"; pt's hand guided back toward handrail of BSC    Balance Overall balance assessment: Needs assistance   Sitting balance-Leahy Scale: Poor       Standing balance-Leahy Scale: Poor Standing balance comment: reliant on external support                           ADL either performed or assessed with clinical judgement   ADL Overall ADL's : Needs  assistance/impaired     Grooming: Oral care;Moderate assistance Grooming Details (indicate cue type and reason): washed face adn brushed teeth minimally; most likely affected by lethargy from meds                 Toilet Transfer: Maximal assistance;Stand-pivot;BSC   Toileting- Clothing Manipulation and Hygiene: Maximal assistance;Sitting/lateral lean       Functional mobility during ADLs: Maximal assistance(stand pivot to chair)       Vision       Perception     Praxis      Cognition Arousal/Alertness: Lethargic(pt had been given seroquel) Behavior During Therapy: Restless Overall Cognitive Status: Impaired/Different from baseline Area of Impairment: Orientation;Attention;Memory;Following commands;Safety/judgement;Awareness;Problem solving                 Orientation Level: Disoriented to;Place;Time;Situation Current Attention Level: Focused Memory: Decreased recall of precautions;Decreased short-term memory Following Commands: Follows one step commands inconsistently Safety/Judgement: Decreased awareness of safety;Decreased awareness of deficits Awareness: Intellectual Problem Solving: Slow processing;Decreased initiation;Difficulty sequencing;Requires verbal cues;Requires tactile cues          Exercises     Shoulder Instructions       General Comments      Pertinent Vitals/ Pain       Pain Assessment: Faces Faces Pain Scale: Hurts little more Pain Location: general discomfort Pain Descriptors / Indicators: Guarding Pain Intervention(s): Limited activity within patient's tolerance  Home Living                                          Prior Functioning/Environment              Frequency  Min 2X/week        Progress Toward Goals  OT Goals(current goals can now be found in the care plan section)  Progress towards OT goals: Progressing toward goals  Acute Rehab OT Goals Patient Stated Goal: pt unable to  state OT Goal Formulation: With family Time For Goal Achievement: 02/25/19 Potential to Achieve Goals: Fair ADL Goals Pt Will Perform Eating: with min assist;sitting Pt Will Perform Grooming: with min assist;sitting Pt Will Perform Upper Body Bathing: with min assist;sitting Pt Will Transfer to Toilet: with mod assist;bedside commode;stand pivot transfer  Plan Discharge plan remains appropriate    Co-evaluation                 AM-PAC OT "6 Clicks" Daily Activity     Outcome Measure   Help from another person eating meals?: A Lot Help from another person taking care of personal grooming?: A Lot Help from another person toileting, which includes using toliet, bedpan, or urinal?: A Lot Help from another person bathing (including washing, rinsing, drying)?: Total Help from another person to put on and taking off regular upper body clothing?: Total Help from another person to put on and taking off regular lower body clothing?: Total 6 Click Score: 9    End of Session    OT Visit Diagnosis: Unsteadiness on feet (R26.81);Other abnormalities of gait and mobility (R26.89);Muscle weakness (generalized) (M62.81);Other symptoms and signs involving cognitive function   Activity Tolerance Patient tolerated treatment well   Patient Left in chair;with call bell/phone within reach;with nursing/sitter in room   Nurse Communication Mobility status        Time: 1500-1530 OT Time Calculation (min): 30 min  Charges: OT General Charges $OT Visit: 1 Visit OT Treatments $Self Care/Home Management : 23-37 mins  Maurie Boettcher, OT/L   Acute OT Clinical Specialist [place] Pager [phone] Office [phone]    [facility] 02/15/2019, 3:58 PM

## 2019-02-15 NOTE — Progress Notes (Signed)
Took over Air cabin crew position at 0300. Assisted patient to bathroom via BSC, and repositioned several times. Patient remains very confused and tearful, only sleeping for brief perioids. Pt continues to be restless, throwing legs over siderails and attempting to pull off safety mitts.

## 2019-02-16 DIAGNOSIS — U071 COVID-19: Secondary | ICD-10-CM | POA: Diagnosis present

## 2019-02-16 DIAGNOSIS — Z950 Presence of cardiac pacemaker: Secondary | ICD-10-CM

## 2019-02-16 DIAGNOSIS — E079 Disorder of thyroid, unspecified: Secondary | ICD-10-CM

## 2019-02-16 DIAGNOSIS — I442 Atrioventricular block, complete: Secondary | ICD-10-CM

## 2019-02-16 DIAGNOSIS — D519 Vitamin B12 deficiency anemia, unspecified: Secondary | ICD-10-CM | POA: Diagnosis present

## 2019-02-16 LAB — CBC
HCT: 42.3 % (ref 36.0–46.0)
Hemoglobin: 13.4 g/dL (ref 12.0–15.0)
MCH: 29.2 pg (ref 26.0–34.0)
MCHC: 31.7 g/dL (ref 30.0–36.0)
MCV: 92.2 fL (ref 80.0–100.0)
Platelets: 169 10*3/uL (ref 150–400)
RBC: 4.59 MIL/uL (ref 3.87–5.11)
RDW: 13.6 % (ref 11.5–15.5)
WBC: 2.8 10*3/uL — ABNORMAL LOW (ref 4.0–10.5)
nRBC: 0 % (ref 0.0–0.2)

## 2019-02-16 LAB — COMPREHENSIVE METABOLIC PANEL
ALT: 28 U/L (ref 0–44)
AST: 33 U/L (ref 15–41)
Albumin: 3.7 g/dL (ref 3.5–5.0)
Alkaline Phosphatase: 95 U/L (ref 38–126)
Anion gap: 7 (ref 5–15)
BUN: 17 mg/dL (ref 8–23)
CO2: 31 mmol/L (ref 22–32)
Calcium: 9.2 mg/dL (ref 8.9–10.3)
Chloride: 106 mmol/L (ref 98–111)
Creatinine, Ser: 0.56 mg/dL (ref 0.44–1.00)
GFR calc Af Amer: >60 mL/min (ref >60)
GFR calc non Af Amer: >60 mL/min (ref >60)
Glucose, Bld: 127 mg/dL — ABNORMAL HIGH (ref 70–99)
Potassium: 4.2 mmol/L (ref 3.5–5.1)
Sodium: 144 mmol/L (ref 135–145)
Total Bilirubin: 0.5 mg/dL (ref 0.3–1.2)
Total Protein: 7.1 g/dL (ref 6.5–8.1)

## 2019-02-16 LAB — AMMONIA: Ammonia: <9 umol/L — ABNORMAL LOW (ref 9–35)

## 2019-02-16 MED ORDER — LORAZEPAM 2 MG/ML IJ SOLN: 0.2500 mg | Freq: Four times a day (QID) | INTRAMUSCULAR | Status: DC | PRN

## 2019-02-16 NOTE — Progress Notes (Signed)
PROGRESS NOTE  Teresa Richard  ZJI:[id number] DOB: 06-10-1940 DOA: 02/09/2019 PCP: Tracie Harrier, MD   Brief Narrative: Teresa Richard is a 78 y.o. female with a history of dementia, CHB s/p PPM, hypothyroidism, B12 deficiency anemia, and depression who presented from memory care due to syncopal event while seated in a chair. At [facility] ED SARS-CoV-2 was positive though she had no respiratory complaints or signs and CXR revealed no infiltrate. She was given IV fluids and admitted to [facility] with resolution of orthostasis. Hospitalization has been complicated by agitated delirium.   Assessment & Plan: SARS-CoV-2 positive: No pneumonia. Unclear if this is a salient finding. Continues to have no respiratory symptoms and no CXR infiltrate.  - Continue isolation currently.  - No indication for treatment at this time. Now >1 week from first positive test.  Syncopal spell  Likely simply related to dehydration- not orthostatic after hydration - increase mobility - in absence of a prominent murmur or persisting symptoms do not feel a TTE would add much to her care at this time  Pancytopenia The patient was reportedly evaluated by a Hematologist while at [facility], but an official note has not been entered-all counts on her CBC continue to trend upward, with her hemoglobin now in the normal range and WBC count approaching normal - likely this is a consequence of the viral infection itself - will need f/u as outpt once fully recovered   Reported history of chronic anemia Iron studies suggest a combination of a very poor nutritional state as well as low iron- folic acid level is normal but B12 is borderline - supplemented B12  Borderline B12 deficiency Supplemented - will need outpatient follow-up  Hypokalemia: Likely due to poor dietary intake, corrected with supplementation  Borderline hypomagnesemia Likely due to poor dietary intake -corrected with supplementation  Hypothyroidism: TSH at goal at  0.9 - Continue home Synthroid dose  Complete heart block s/p [employer] [id number] H Feb 2020: No evidence of an arrhythmia or pacer malfunction at this time  Underweight and acute severe protein calorie malnutrition: Continues to have very sparse po intake. Complicated by delirium and sedating medications that have been used to preserve patient safety.  - RD consulted, supplementing protein as able.   DVT prophylaxis: Lovenox Code Status: DNR Family Communication: None at bedside, daughter updated by CM and previous hospitalist. No changes to management currently planned. Disposition Plan: DC back to SNF tomorrow with palliative care following, if remains stable. If oral intake does not improve, would recommend hospice care.   Consultants:   None  Procedures:   None  Antimicrobials:  None   Subjective: Minimally interactive, received ativan 0.58m IV earlier this morning and has been lethargic since. Will decrease dose. Has not had any evidence of respiratory distress. Is able to take po when more alert.   Objective: Vitals:   02/15/19 1630 02/15/19 1923 02/16/19 0415 02/16/19 0800  BP: (!) 147/69 136/72 (!) 152/87 (!) 103/50  Pulse: 71 68 75 62  Resp: _0 Temp: 98.1 F (36.7 C) 98.1 F (36.7 C) 97.9 F (36.6 C) 97.8 F (36.6 C)  TempSrc:  Oral Oral Oral  SpO2: 100% 100% 98% 99%  Weight:      Height:        Intake/Output Summary (Last 24 hours) at 02/16/2019 1414 Last data filed at 02/16/2019 0552 Gross per 24 hour  Intake 650 ml  Output -  Net 650 ml   Filed Weights   02/10/19 1817  Weight:  47.2 kg    Gen: Frail elderly female in no distress Pulm: Non-labored breathing room air. Clear to auscultation bilaterally.  CV: Irreg. No murmur, rub, or gallop. No JVD, no pedal edema. GI: Abdomen soft, non-tender, non-distended, with normoactive bowel sounds. No organomegaly or masses felt. Ext: Warm, no deformities Skin: No rashes, lesions or new ulcers  Neuro: Lethargic, rousable, not cooperative with interview or exam.  Psych: UTD  Data Reviewed: I have personally reviewed following labs and imaging studies  CBC: Recent Labs  Lab 02/10/19 0500 02/11/19 0203 02/12/19 0152 02/14/19 0335 02/16/19 0310  WBC 2.7* 3.1* 2.8* 2.4* 2.8*  NEUTROABS 1.7 2.3 1.9  --   --   HGB 12.5 12.2 12.8 12.9 13.4  HCT 39.6 38.1 39.8 40.8 42.3  MCV 92.7 90.3 91.1 92.5 92.2  PLT 132* 125* 118* 133* 825   Basic Metabolic Panel: Recent Labs  Lab 02/10/19 0500 02/11/19 0203 02/12/19 0152 02/14/19 0335 02/16/19 0310  NA 140 138 138 144 144  K 4.4 3.3* 3.9 4.7 4.2  CL 102 100 101 104 106  CO2 _0 33* 31  GLUCOSE 85 111* 107* 111* 127*  BUN _1 34* 17  CREATININE 0.52 0.56 0.61 0.67 0.56  CALCIUM 8.9 8.8* 8.9 9.4 9.2  MG 2.0 1.8 2.0  --   --    GFR: Estimated Creatinine Clearance: 43.2 mL/min (by C-G formula based on SCr of 0.56 mg/dL). Liver Function Tests: Recent Labs  Lab 02/10/19 0500 02/11/19 0203 02/12/19 0152 02/16/19 0310  AST 33 26 22 33  ALT _2 ALKPHOS 94 84 86 95  BILITOT 0.7 0.6 0.5 0.5  PROT 6.9 6.5 6.4* 7.1  ALBUMIN 3.9 3.6 3.7 3.7   No results for input(s): LIPASE, AMYLASE in the last 168 hours. Recent Labs  Lab 02/16/19 0310  AMMONIA <9*   Coagulation Profile: No results for input(s): INR, PROTIME in the last 168 hours. Cardiac Enzymes: No results for input(s): CKTOTAL, CKMB, CKMBINDEX, TROPONINI in the last 168 hours. BNP (last 3 results) No results for input(s): PROBNP in the last 8760 hours. HbA1C: No results for input(s): HGBA1C in the last 72 hours. CBG: No results for input(s): GLUCAP in the last 168 hours. Lipid Profile: No results for input(s): CHOL, HDL, LDLCALC, TRIG, CHOLHDL, LDLDIRECT in the last 72 hours. Thyroid Function Tests: No results for input(s): TSH, T4TOTAL, FREET4, T3FREE, THYROIDAB in the last 72 hours. Anemia Panel: No results for input(s): VITAMINB12, FOLATE,  FERRITIN, TIBC, IRON, RETICCTPCT in the last 72 hours. Urine analysis:    Component Value Date/Time   COLORURINE YELLOW (A) 02/09/2019 1144   APPEARANCEUR CLEAR (A) 02/09/2019 1144   LABSPEC 1.012 02/09/2019 1144   PHURINE 6.0 02/09/2019 1144   GLUCOSEU NEGATIVE 02/09/2019 1144   HGBUR NEGATIVE 02/09/2019 1144   BILIRUBINUR NEGATIVE 02/09/2019 1144   KETONESUR NEGATIVE 02/09/2019 1144   PROTEINUR NEGATIVE 02/09/2019 1144   NITRITE NEGATIVE 02/09/2019 1144   LEUKOCYTESUR NEGATIVE 02/09/2019 1144   Recent Results (from the past 240 hour(s))  SARS Coronavirus 2 [facility] order, Performed in [facility] hospital lab) Nasopharyngeal Nasopharyngeal Swab     Status: Abnormal   Collection Time: 02/09/19 11:44 AM   Specimen: Nasopharyngeal Swab  Result Value Ref Range Status   SARS Coronavirus 2 POSITIVE (A) NEGATIVE Final    Comment: RESULT CALLED TO, READ BACK BY AND VERIFIED WITH: MAC BROWN 02/09/19 @ 1325  [place] (NOTE) If result is NEGATIVE SARS-CoV-2 target  nucleic acids are NOT DETECTED. The SARS-CoV-2 RNA is generally detectable in upper and lower  respiratory specimens during the acute phase of infection. The lowest  concentration of SARS-CoV-2 viral copies this assay can detect is 250  copies / mL. A negative result does not preclude SARS-CoV-2 infection  and should not be used as the sole basis for treatment or other  patient management decisions.  A negative result may occur with  improper specimen collection / handling, submission of specimen other  than nasopharyngeal swab, presence of viral mutation(s) within the  areas targeted by this assay, and inadequate number of viral copies  (<250 copies / mL). A negative result must be combined with clinical  observations, patient history, and epidemiological information. If result is POSITIVE SARS-CoV-2 target nucleic acids are DETECTED. The SARS -CoV-2 RNA is generally detectable in upper and lower  respiratory specimens during  the acute phase of infection.  Positive  results are indicative of active infection with SARS-CoV-2.  Clinical  correlation with patient history and other diagnostic information is  necessary to determine patient infection status.  Positive results do  not rule out bacterial infection or co-infection with other viruses. If result is PRESUMPTIVE POSTIVE SARS-CoV-2 nucleic acids MAY BE PRESENT.   A presumptive positive result was obtained on the submitted specimen  and confirmed on repeat testing.  While 2019 novel coronavirus  (SARS-CoV-2) nucleic acids may be present in the submitted sample  additional confirmatory testing may be necessary for epidemiological  and / or clinical management purposes  to differentiate between  SARS-CoV-2 and other Sarbecovirus currently known to infect humans.  If clinically indicated additional testing with an alternate test  methodology [phone]) is advise d. The SARS-CoV-2 RNA is generally  detectable in upper and lower respiratory specimens during the acute  phase of infection. The expected result is Negative. Fact Sheet for Patients:  StrictlyIdeas.no Fact Sheet for Healthcare Providers: BankingDealers.co.za This test is not yet approved or cleared by the [place] FDA and has been authorized for detection and/or diagnosis of SARS-CoV-2 by FDA under an Emergency Use Authorization (EUA).  This EUA will remain in effect (meaning this test can be used) for the duration of the COVID-19 declaration under Section 564(b)(1) of the Act, 21 U.S.C. section 360bbb-3(b)(1), unless the authorization is terminated or revoked sooner. Performed at [facility], [address] [place], [place] [address]   MRSA PCR Screening     Status: None   Collection Time: 02/09/19  4:56 PM   Specimen: Nasal Mucosa; Nasopharyngeal  Result Value Ref Range Status   MRSA by PCR NEGATIVE NEGATIVE Final    Comment:         The GeneXpert MRSA Assay (FDA approved for NASAL specimens only), is one component of a comprehensive MRSA colonization surveillance program. It is not intended to diagnose MRSA infection nor to guide or monitor treatment for MRSA infections. Performed at [facility], [place] [address] [place], [place] [address]   SARS CORONAVIRUS 2 (TAT 6-24 HRS) Nasopharyngeal Nasopharyngeal Swab     Status: Abnormal   Collection Time: 02/14/19  3:47 PM   Specimen: Nasopharyngeal Swab  Result Value Ref Range Status   SARS Coronavirus 2 POSITIVE (A) NEGATIVE Final    Comment: (NOTE) SARS-CoV-2 target nucleic acids are DETECTED. The SARS-CoV-2 RNA is generally detectable in upper and lower respiratory specimens during the acute phase of infection. Positive results are indicative of active infection with SARS-CoV-2. Clinical  correlation with  patient history and other diagnostic information is necessary to determine patient infection status. Positive results do  not rule out bacterial infection or co-infection with other viruses. The expected result is Negative. Fact Sheet for Patients: SugarRoll.be Fact Sheet for Healthcare Providers: https://www.woods-mathews.com/ This test is not yet approved or cleared by the [place] FDA and  has been authorized for detection and/or diagnosis of SARS-CoV-2 by FDA under an Emergency Use Authorization (EUA). This EUA will remain  in effect (meaning this test can be used) for the duration of the COVID-19 declaration under Section 564(b)(1) of the Act, 21 U.S.C.  section 360bbb-3(b)(1), unless the authorization is terminated or revoked sooner. Performed at [facility], [place] [address], [place], [place] [address]       Radiology Studies: No results found.  Scheduled Meds: . aspirin EC  81 mg Oral Daily  . busPIRone  5 mg Oral BID  . enoxaparin (LOVENOX) injection  40 mg Subcutaneous  Q24H  . feeding supplement (ENSURE ENLIVE)  237 mL Oral TID BM  . levothyroxine  75 mcg Oral BH-q7a  . magnesium oxide  400 mg Oral Daily  . Melatonin  5 mg Oral QHS  . multivitamin with minerals  1 tablet Oral Daily  . QUEtiapine  50 mg Oral 3 times per day  . senna  1 tablet Oral BID   Continuous Infusions: . dextrose 5 % and 0.9% NaCl 50 mL/hr at 02/15/19 1500     LOS: 7 days   Time spent: 35 minutes.  Patrecia Pour, MD [facility] [email or web address] Password [facility] 02/16/2019, 2:14 PM

## 2019-02-16 NOTE — Progress Notes (Addendum)
0700 received bedside report, pt resting, board updated 0800 assessment and vs completed and charted. meds given 1000 held all meds due to pt being to sleepy and confused, notified doctor 1200 pt still sleeping, she received ativan last night 1420 called daughter to give update, got the pt to eat small amount 1600 ambulated pt a little in the room but she is to confused to follow directions 1700 pt took a couple of bites of her food but wouldn't eat any more

## 2019-02-16 NOTE — Progress Notes (Signed)
Nutrition Follow-up  DOCUMENTATION CODES:   Underweight  INTERVENTION:    Consider adjustment to bowel regimen, last BM documented 9/9.   Continue to offer Ensure Enlive po TID, each supplement provides 350 kcal and 20 grams of protein   Continue Hormel Shake daily with breakfast which provides 520 kcals and 22 g of protein and Magic cup BID with lunch and dinner, each supplement provides 290 kcal and 9 grams of protein, automatically on meal trays to optimize nutritional intake.   Encourage POs.  NUTRITION DIAGNOSIS:   Increased nutrient needs related to acute illness(COVID) as evidenced by estimated needs.  Ongoing   GOAL:   Patient will meet greater than or equal to 90% of their needs  Unmet   MONITOR:   PO intake, Supplement acceptance  ASSESSMENT:   78 yo female admitted with syncope; found to be COVID-19 positive. Patient resides in a memory care unit. PMH also includes B12 deficiency, anemia, depression, CHB s/p pacemaker, hypothyroidism.  Patient has been consuming 0-10% of meals since admission. She is being offered Ensure Enlive supplements TID. She drinks some and refuses some. She has refused two Ensure supplements today. Nursing is encouraging intake of meals and Ensure supplements.   No BM documented for the past week. Labs and medications reviewed.  No recent weight available.  Patient is stable for d/c to SNF, awaiting facilities ability to accept her.  Diet Order:   Diet Order            Diet regular Room service appropriate? Yes; Fluid consistency: Thin  Diet effective now              EDUCATION NEEDS:   Not appropriate for education at this time  Skin:  Skin Assessment: Reviewed RN Assessment  Last BM:  9/9 (type 4)  Height:   Ht Readings from Last 1 Encounters:  02/10/19 5\' 3"  (1.6 m)    Weight:   Wt Readings from Last 1 Encounters:  02/10/19 47.2 kg    Ideal Body Weight:  52.3 kg  BMI:  Body mass index is 18.42  kg/m.  Estimated Nutritional Needs:   Kcal:  1400-1600  Protein:  60-70 gm  Fluid:  >/= 1.4 L    Molli Barrows, RD, LDN, [place] Pager [phone] After Hours Pager [phone]

## 2019-02-16 NOTE — TOC Progression Note (Signed)
Transition of Care [facility]) - Progression Note    Patient Details  Name: Teresa Richard MRN: [id number] Date of Birth: Dec 26, 1940  [facility] CM/SW Contact  Loletha Grayer Beverely Pace, RN Phone Number: [phone] (working remotely) 02/16/2019, 2:50 PM  Clinical Narrative:   Case manager spoke with Lauretta Chester, Admission coordinator ar [facility]an, after 3 attempts. He said that patient may return to [facility]. We will arrange for transport in the morning around 11AM. Case manager will contact patient's daughter Juanito Doom and update her.  Report will be called to [phone], patient will go to room 101. DC summary and FL2 will be faxed to [phone].        Expected Discharge Plan and Services                                                 Social Determinants of Health (SDOH) Interventions    Readmission Risk Interventions No flowsheet data found.

## 2019-02-17 DIAGNOSIS — D519 Vitamin B12 deficiency anemia, unspecified: Secondary | ICD-10-CM

## 2019-02-17 MED ORDER — ALPRAZOLAM 0.25 MG PO TABS: 0.2500 mg | ORAL_TABLET | Freq: Every day | ORAL | 0 refills | Status: DC | PRN

## 2019-02-17 NOTE — TOC Transition Note (Addendum)
Transition of Care [facility]) - CM/SW Discharge Note   Patient Details  Name: Teresa Richard MRN: [id number] Date of Birth: 1941/02/22  [facility]) CM/SW Contact:  Ninfa Meeker, RN Phone Number: [phone] (working remotely) 02/17/2019, 9:07 AM   Clinical Narrative:  Discharge summary has been entered for patient, CM will fax to Ed Weeks at [facility]. Case Manager has notified patient's daughter, Teresa Richard of scheduled transport for her mom back to [facility] and she is very happy with that. PTAR has been arranged for 11am pickup. Bedside RN to call report  to [phone], patient will go to room 101.    Final next level of care: Memory Care Barriers to Discharge: No Barriers Identified   Patient Goals and CMS Choice     Choice offered to / list presented to : Adult Children  Discharge Placement                       Discharge Plan and Services   Discharge Planning Services: CM Consult, Other - See comment(facilitate return to facility)                      HH Arranged: NA          Social Determinants of Health (SDOH) Interventions     Readmission Risk Interventions No flowsheet data found.

## 2019-02-17 NOTE — NC FL2 (Signed)
[place] MEDICAID FL2 LEVEL OF CARE SCREENING TOOL     IDENTIFICATION  Patient Name: Teresa PavlovLinda Teresi Birthdate: 02/09/1941 Sex: female Admission Date (Current Location): 02/09/2019  [facility] [place]   Facility and Address:         Provider Number: [id number]  Attending Physician Name and Address:  Tyrone NineGrunz, Ryan B, MD  Relative Name and Phone Number:       Current Level of Care:   Recommended Level of Care:   Prior Approval Number:    Date Approved/Denied:   PASRR Number:    Discharge Plan: SNF    Current Diagnoses: Patient Active Problem List   Diagnosis Date Noted  . COVID-19 virus infection 02/16/2019  . B12 deficiency anemia 02/16/2019  . Syncope 02/09/2019  . Pancytopenia (HCC) 02/09/2019  . 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 Thyroid disease 01/23/2014  . 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    Orientation RESPIRATION BLADDER Height & Weight     Self, Situation  Normal Incontinent Weight: 47.2 kg Height:  5\' 3"  (160 cm)  BEHAVIORAL SYMPTOMS/MOOD NEUROLOGICAL BOWEL NUTRITION STATUS      Incontinent    AMBULATORY STATUS COMMUNICATION OF NEEDS Skin   Limited Assist Verbally Normal                       Personal Care Assistance Level of Assistance  Total care       Total Care Assistance: Maximum assistance   Functional Limitations Info             SPECIAL CARE FACTORS FREQUENCY  PT (By licensed PT), OT (By licensed OT)     PT Frequency: 5x/week OT Frequency: 3/per week min            Contractures      Additional Factors Info  Code Status Code Status Info: DNR             Current Medications (02/17/2019):  This is the current hospital active medication list Current Facility-Administered Medications  Medication Dose Route Frequency Provider Last Rate Last Dose  . acetaminophen (TYLENOL) tablet 650 mg  650 mg Oral Q6H PRN Lonia BloodMcClung, Jeffrey T, MD   650 mg at 02/13/19 1959  . aspirin EC tablet 81 mg   81 mg Oral Daily Lonia BloodMcClung, Jeffrey T, MD   Stopped at 02/16/19 [phone]  . busPIRone (BUSPAR) tablet 5 mg  5 mg Oral BID Lonia BloodMcClung, Jeffrey T, MD   5 mg at 02/16/19 2100  . dextrose 5 %-0.9 % sodium chloride infusion   Intravenous Continuous Lonia BloodMcClung, Jeffrey T, MD 50 mL/hr at 02/17/19 0559    . enoxaparin (LOVENOX) injection 40 mg  40 mg Subcutaneous Q24H Lonia BloodMcClung, Jeffrey T, MD   40 mg at 02/16/19 1721  . feeding supplement (ENSURE ENLIVE) (ENSURE ENLIVE) liquid 237 mL  237 mL Oral TID BM Lonia BloodMcClung, Jeffrey T, MD   237 mL at 02/16/19 1929  . influenza vaccine adjuvanted (FLUAD) injection 0.5 mL  0.5 mL Intramuscular Prior to discharge Lonia BloodMcClung, Jeffrey T, MD      . levothyroxine (SYNTHROID) tablet 75 mcg  75 mcg Oral Toniann FailBH-q7a McClung, Jeffrey T, MD   75 mcg at 02/17/19 0600  . LORazepam (ATIVAN) injection 0.25 mg  0.25 mg Intravenous Q6H PRN Tyrone NineGrunz, Ryan B, MD      . magnesium hydroxide (MILK OF MAGNESIA) suspension 15 mL  15 mL Oral Daily PRN Lonia BloodMcClung, Jeffrey T, MD   15 mL at  02/13/19 1333  . magnesium oxide (MAG-OX) tablet 400 mg  400 mg Oral Daily Cherene Altes, MD   Stopped at 02/16/19 0932  . Melatonin TABS 5 mg  5 mg Oral QHS Cherene Altes, MD   5 mg at 02/16/19 2100  . multivitamin with minerals tablet 1 tablet  1 tablet Oral Daily Cherene Altes, MD   Stopped at 02/16/19 0932  . QUEtiapine (SEROQUEL) tablet 50 mg  50 mg Oral 3 times per day Cherene Altes, MD   50 mg at 02/17/19 0845  . senna (SENOKOT) tablet 8.6 mg  1 tablet Oral BID Cherene Altes, MD   8.6 mg at 02/16/19 2100     Discharge Medications: Please see discharge summary for a list of discharge medications.  Relevant Imaging Results:  Relevant Lab Results:   Additional Information [id number]  Ninfa Meeker, RN

## 2019-02-17 NOTE — Discharge Summary (Signed)
Physician Discharge Summary  Teresa Richard ATF:[id number] DOB: 24-Jul-1940 DOA: 02/09/2019  PCP: Tracie Harrier, MD  Admit date: 02/09/2019 Discharge date: 02/17/2019  Admitted From: ALF Disposition: SNF   Recommendations for Outpatient Follow-up:  1. Follow up with PCP in 2 weeks 2. Please obtain CMP/CBC in one week  Home Health: N/A Equipment/Devices: Per SNF Discharge Condition: Stable CODE STATUS: DNR (gold form in chart) Diet recommendation: As tolerated  Brief/Interim Summary: Teresa Richard is a 78 y.o. female with a history of dementia, CHB s/p PPM, hypothyroidism, B12 deficiency anemia, and depression who presented from memory care due to syncopal event while seated in a chair. At [facility] ED SARS-CoV-2 was positive though she had no respiratory complaints or signs and CXR revealed no infiltrate. She was given IV fluids and admitted to [facility] with resolution of orthostasis. Hospitalization has been complicated by agitated delirium though this and her functional mobility have shown improvement with physical therapy. Fortunately she's exhibited no respiratory distress or hypoxia, so no targeted therapies for covid-19 have been given. SNF rehabilitation is recommended and available at discharge.   Discharge Diagnoses:  Principal Problem:   COVID-19 virus infection Active Problems:   H/O cardiac pacemaker   Heart block AV third degree (HCC)   Thyroid disease   Syncope   Pancytopenia (HCC)   B12 deficiency anemia  SARS-CoV-2 positive: No pneumonia. Unclear if this is a salient finding. Continues to have no respiratory symptoms and no CXR infiltrate.  - Continue isolation currently.  - No indication for treatment at this time. Now 9 days from first positive test and asymptomatic.  Syncopal spell : Likely simply related to dehydration. Not orthostatic after hydration. In absence of a prominent murmur or persisting symptoms do not feel a TTE would add much to her care at this  time.  Acute delirium: Continue home seroquel and xanax.   Pancytopenia: The patient was reportedly evaluated by a Hematologist while at [facility], but an official note has not been entered. All counts on her CBC continue to trend upward, with her hemoglobin now in the normal range and WBC count approaching normal - likely this is a consequence of the viral infection itself  - Will need recheck and f/u as outpt once fully recovered   Reported history of chronic anemia: Iron studies suggest a combination of a very poor nutritional state as well as low iron. Folic acid level is normal but B12 is borderline  - Supplemented B12  Borderline B12 deficiency: Supplemented  - Will need outpatient follow-up. Per report, Dr. Ginette Pitman oversees injections.  Hypokalemia: Likely due to poor dietary intake, corrected with supplementation  Borderline hypomagnesemia: Likely due to poor dietary intake -corrected with supplementation  Hypothyroidism: TSH at goal at 0.9 - Continue home Synthroid dose  Complete heart block s/p [employer] [id number] H Feb 2020: No evidence of pacer malfunction at this time  Underweight and acute severe protein calorie malnutrition: Continues to have very sparse po intake. Complicated by delirium and sedating medications that have been used to preserve patient safety.  No nausea reported or vomiting noted.  - Consider appetite stimulant - RD consulted, supplementing protein as able.   Discharge Instructions  Allergies as of 02/17/2019      Reactions   Hydrocodone Other (See Comments)   Feels like she is floating   Oxycodone Other (See Comments)   Feels like she is floating      Medication List    TAKE these medications   ALPRAZolam 0.25 MG tablet Commonly  known as: XANAX Take 1 tablet (0.25 mg total) by mouth daily as needed for anxiety.   aspirin EC 81 MG tablet Take 81 mg by mouth daily.   busPIRone 5 MG tablet Commonly known as: BUSPAR Take 5 mg by mouth 2  (two) times daily.   levothyroxine 75 MCG tablet Commonly known as: SYNTHROID Take 75 mcg by mouth every morning.   magnesium hydroxide 400 MG/5ML suspension Commonly known as: MILK OF MAGNESIA Take 15 mLs by mouth daily as needed for mild constipation.   magnesium oxide 400 MG tablet Commonly known as: MAG-OX Take 400 mg by mouth daily.   Melatonin 5 MG Tabs Take 5 mg by mouth at bedtime.   QUEtiapine 50 MG tablet Commonly known as: SEROQUEL Take 50 mg by mouth 3 (three) times daily. 0800, 1400, 2000   VITAMIN I-20 IJ Inject 1 application as directed every 4 (four) months. Dr. Ginette Pitman oversees injection. Patient had last dose in 10/19   Vitamin D3 25 MCG (1000 UT) Caps Take 1,000 Units by mouth daily.      Follow-up Information    Tracie Harrier, MD Follow up.   Specialty: Internal Medicine Contact information: [place] [address] [phone]          Allergies  Allergen Reactions  . Hydrocodone Other (See Comments)    Feels like she is floating  . Oxycodone Other (See Comments)    Feels like she is floating    Consultations:  None  Procedures/Studies: Dg Chest Port 1 View  Result Date: 02/10/2019 CLINICAL DATA:  COVID-19 positivity EXAM: PORTABLE CHEST 1 VIEW COMPARISON:  10/27/2018 FINDINGS: Cardiac shadows within normal limits. Pacing device is noted in satisfactory position. Lungs are well aerated bilaterally. No focal infiltrate or sizable effusion is seen. No bony abnormality is noted. IMPRESSION: No acute abnormality noted. Electronically Signed   By: Inez Catalina M.D.   On: 02/10/2019 08:45    Subjective: Sitter at bedside reports she just got up with assistance to [facility] and did better than previous days. No respiratory complaints. Agitation has been improved. No fevers, no cough.   Discharge Exam: Vitals:   02/17/19 0308 02/17/19 0845  BP: (!) 143/72 98/64  Pulse: 84 69  Resp: 20   Temp: 98.4  F (36.9 C) 97.9 F (36.6 C)  SpO2: 96% 98%   General: Frail elderly female in no distress Cardiovascular: RRR, no edema, no murmur Respiratory: Nonlabored and clear Abdominal: Soft, NT, ND, bowel sounds +  Labs: BNP (last 3 results) No results for input(s): BNP in the last 8760 hours. Basic Metabolic Panel: Recent Labs  Lab 02/11/19 0203 02/12/19 0152 02/14/19 0335 02/16/19 0310  NA 138 138 144 144  K 3.3* 3.9 4.7 4.2  CL 100 101 104 106  CO2 30 30 33* 31  GLUCOSE 111* 107* 111* 127*  BUN 17 15 34* 17  CREATININE 0.56 0.61 0.67 0.56  CALCIUM 8.8* 8.9 9.4 9.2  MG 1.8 2.0  --   --    Liver Function Tests: Recent Labs  Lab 02/11/19 0203 02/12/19 0152 02/16/19 0310  AST 26 22 33  ALT _0 ALKPHOS 84 86 95  BILITOT 0.6 0.5 0.5  PROT 6.5 6.4* 7.1  ALBUMIN 3.6 3.7 3.7   No results for input(s): LIPASE, AMYLASE in the last 168 hours. Recent Labs  Lab 02/16/19 0310  AMMONIA <9*   CBC: Recent Labs  Lab 02/11/19 0203 02/12/19 0152  02/14/19 0335 02/16/19 0310  WBC 3.1* 2.8* 2.4* 2.8*  NEUTROABS 2.3 1.9  --   --   HGB 12.2 12.8 12.9 13.4  HCT 38.1 39.8 40.8 42.3  MCV 90.3 91.1 92.5 92.2  PLT 125* 118* 133* 169   Cardiac Enzymes: No results for input(s): CKTOTAL, CKMB, CKMBINDEX, TROPONINI in the last 168 hours. BNP: Invalid input(s): POCBNP CBG: No results for input(s): GLUCAP in the last 168 hours. D-Dimer No results for input(s): DDIMER in the last 72 hours. Hgb A1c No results for input(s): HGBA1C in the last 72 hours. Lipid Profile No results for input(s): CHOL, HDL, LDLCALC, TRIG, CHOLHDL, LDLDIRECT in the last 72 hours. Thyroid function studies No results for input(s): TSH, T4TOTAL, T3FREE, THYROIDAB in the last 72 hours.  Invalid input(s): FREET3 Anemia work up No results for input(s): VITAMINB12, FOLATE, FERRITIN, TIBC, IRON, RETICCTPCT in the last 72 hours. Urinalysis    Component Value Date/Time   COLORURINE YELLOW (A) 02/09/2019  1144   APPEARANCEUR CLEAR (A) 02/09/2019 1144   LABSPEC 1.012 02/09/2019 1144   PHURINE 6.0 02/09/2019 1144   GLUCOSEU NEGATIVE 02/09/2019 1144   HGBUR NEGATIVE 02/09/2019 [place] 02/09/2019 1144   KETONESUR NEGATIVE 02/09/2019 1144   PROTEINUR NEGATIVE 02/09/2019 1144   NITRITE NEGATIVE 02/09/2019 [place] 02/09/2019 1144    Microbiology Recent Results (from the past 240 hour(s))  SARS Coronavirus 2 [facility] order, Performed in [facility] hospital lab) Nasopharyngeal Nasopharyngeal Swab     Status: Abnormal   Collection Time: 02/09/19 11:44 AM   Specimen: Nasopharyngeal Swab  Result Value Ref Range Status   SARS Coronavirus 2 POSITIVE (A) NEGATIVE Final    Comment: RESULT CALLED TO, READ BACK BY AND VERIFIED WITH: MAC BROWN 02/09/19 @ 1325  [place] (NOTE) If result is NEGATIVE SARS-CoV-2 target nucleic acids are NOT DETECTED. The SARS-CoV-2 RNA is generally detectable in upper and lower  respiratory specimens during the acute phase of infection. The lowest  concentration of SARS-CoV-2 viral copies this assay can detect is 250  copies / mL. A negative result does not preclude SARS-CoV-2 infection  and should not be used as the sole basis for treatment or other  patient management decisions.  A negative result may occur with  improper specimen collection / handling, submission of specimen other  than nasopharyngeal swab, presence of viral mutation(s) within the  areas targeted by this assay, and inadequate number of viral copies  (<250 copies / mL). A negative result must be combined with clinical  observations, patient history, and epidemiological information. If result is POSITIVE SARS-CoV-2 target nucleic acids are DETECTED. The SARS -CoV-2 RNA is generally detectable in upper and lower  respiratory specimens during the acute phase of infection.  Positive  results are indicative of active infection with SARS-CoV-2.  Clinical  correlation  with patient history and other diagnostic information is  necessary to determine patient infection status.  Positive results do  not rule out bacterial infection or co-infection with other viruses. If result is PRESUMPTIVE POSTIVE SARS-CoV-2 nucleic acids MAY BE PRESENT.   A presumptive positive result was obtained on the submitted specimen  and confirmed on repeat testing.  While 2019 novel coronavirus  (SARS-CoV-2) nucleic acids may be present in the submitted sample  additional confirmatory testing may be necessary for epidemiological  and / or clinical management purposes  to differentiate between  SARS-CoV-2 and other Sarbecovirus currently known to infect humans.  If clinically indicated additional testing  with an alternate test  methodology [phone]) is advise d. The SARS-CoV-2 RNA is generally  detectable in upper and lower respiratory specimens during the acute  phase of infection. The expected result is Negative. Fact Sheet for Patients:  StrictlyIdeas.no Fact Sheet for Healthcare Providers: BankingDealers.co.za This test is not yet approved or cleared by the [place] FDA and has been authorized for detection and/or diagnosis of SARS-CoV-2 by FDA under an Emergency Use Authorization (EUA).  This EUA will remain in effect (meaning this test can be used) for the duration of the COVID-19 declaration under Section 564(b)(1) of the Act, 21 U.S.C. section 360bbb-3(b)(1), unless the authorization is terminated or revoked sooner. Performed at [facility], [address] [place], [place] [address]   MRSA PCR Screening     Status: None   Collection Time: 02/09/19  4:56 PM   Specimen: Nasal Mucosa; Nasopharyngeal  Result Value Ref Range Status   MRSA by PCR NEGATIVE NEGATIVE Final    Comment:        The GeneXpert MRSA Assay (FDA approved for NASAL specimens only), is one component of a comprehensive MRSA  colonization surveillance program. It is not intended to diagnose MRSA infection nor to guide or monitor treatment for MRSA infections. Performed at [facility], [place] [address], [place], [place] [address]   SARS CORONAVIRUS 2 (TAT 6-24 HRS) Nasopharyngeal Nasopharyngeal Swab     Status: Abnormal   Collection Time: 02/14/19  3:47 PM   Specimen: Nasopharyngeal Swab  Result Value Ref Range Status   SARS Coronavirus 2 POSITIVE (A) NEGATIVE Final    Comment: (NOTE) SARS-CoV-2 target nucleic acids are DETECTED. The SARS-CoV-2 RNA is generally detectable in upper and lower respiratory specimens during the acute phase of infection. Positive results are indicative of active infection with SARS-CoV-2. Clinical  correlation with patient history and other diagnostic information is necessary to determine patient infection status. Positive results do  not rule out bacterial infection or co-infection with other viruses. The expected result is Negative. Fact Sheet for Patients: SugarRoll.be Fact Sheet for Healthcare Providers: https://www.woods-mathews.com/ This test is not yet approved or cleared by the [place] FDA and  has been authorized for detection and/or diagnosis of SARS-CoV-2 by FDA under an Emergency Use Authorization (EUA). This EUA will remain  in effect (meaning this test can be used) for the duration of the COVID-19 declaration under Section 564(b)(1) of the Act, 21 U.S.C.  section 360bbb-3(b)(1), unless the authorization is terminated or revoked sooner. Performed at [facility], [place] [address], [place], [place] [address]     Time coordinating discharge: Approximately 40 minutes  Patrecia Pour, MD  [facility] 02/17/2019, 9:11 AM

## 2019-02-17 NOTE — Progress Notes (Signed)
Patient bathed, mitts removed, IV removed, EMS to bedside to tx to SNF. Report called to [facility] at [facility].

## 2019-10-22 ENCOUNTER — Emergency Department: Payer: Medicare Other

## 2019-10-22 ENCOUNTER — Emergency Department
Admission: EM | Admit: 2019-10-22 | Discharge: 2019-10-22 | Disposition: A | Discharge status: Other Acute Inpatient Hospital | Payer: Medicare Other | Attending: Emergency Medicine | Admitting: Emergency Medicine

## 2019-10-22 ENCOUNTER — Other Ambulatory Visit: Payer: Self-pay

## 2019-10-22 DIAGNOSIS — Z23 Encounter for immunization: Secondary | ICD-10-CM | POA: Diagnosis not present

## 2019-10-22 DIAGNOSIS — S5001XA Contusion of right elbow, initial encounter: Secondary | ICD-10-CM | POA: Insufficient documentation

## 2019-10-22 DIAGNOSIS — S40012A Contusion of left shoulder, initial encounter: Secondary | ICD-10-CM | POA: Diagnosis not present

## 2019-10-22 DIAGNOSIS — W19XXXA Unspecified fall, initial encounter: Secondary | ICD-10-CM | POA: Insufficient documentation

## 2019-10-22 DIAGNOSIS — Z950 Presence of cardiac pacemaker: Secondary | ICD-10-CM | POA: Diagnosis not present

## 2019-10-22 DIAGNOSIS — S065XAA Traumatic subdural hemorrhage with loss of consciousness status unknown, initial encounter: Secondary | ICD-10-CM

## 2019-10-22 DIAGNOSIS — I609 Nontraumatic subarachnoid hemorrhage, unspecified: Secondary | ICD-10-CM

## 2019-10-22 DIAGNOSIS — Y999 Unspecified external cause status: Secondary | ICD-10-CM | POA: Insufficient documentation

## 2019-10-22 DIAGNOSIS — S0282XA Fracture of other specified skull and facial bones, left side, initial encounter for closed fracture: Secondary | ICD-10-CM | POA: Diagnosis not present

## 2019-10-22 DIAGNOSIS — F0390 Unspecified dementia without behavioral disturbance: Secondary | ICD-10-CM | POA: Insufficient documentation

## 2019-10-22 DIAGNOSIS — S066X0A Traumatic subarachnoid hemorrhage without loss of consciousness, initial encounter: Secondary | ICD-10-CM | POA: Diagnosis not present

## 2019-10-22 DIAGNOSIS — S065X0A Traumatic subdural hemorrhage without loss of consciousness, initial encounter: Secondary | ICD-10-CM | POA: Diagnosis not present

## 2019-10-22 DIAGNOSIS — Z79899 Other long term (current) drug therapy: Secondary | ICD-10-CM | POA: Diagnosis not present

## 2019-10-22 DIAGNOSIS — S0990XA Unspecified injury of head, initial encounter: Secondary | ICD-10-CM | POA: Diagnosis present

## 2019-10-22 DIAGNOSIS — Y92129 Unspecified place in nursing home as the place of occurrence of the external cause: Secondary | ICD-10-CM | POA: Insufficient documentation

## 2019-10-22 DIAGNOSIS — S01112A Laceration without foreign body of left eyelid and periocular area, initial encounter: Secondary | ICD-10-CM | POA: Diagnosis not present

## 2019-10-22 DIAGNOSIS — Y9389 Activity, other specified: Secondary | ICD-10-CM | POA: Insufficient documentation

## 2019-10-22 LAB — BASIC METABOLIC PANEL
Anion gap: 11 (ref 5–15)
BUN: 33 mg/dL — ABNORMAL HIGH (ref 8–23)
CO2: 30 mmol/L (ref 22–32)
Calcium: 9 mg/dL (ref 8.9–10.3)
Chloride: 99 mmol/L (ref 98–111)
Creatinine, Ser: 0.74 mg/dL (ref 0.44–1.00)
GFR calc Af Amer: >60 mL/min (ref >60)
GFR calc non Af Amer: >60 mL/min (ref >60)
Glucose, Bld: 107 mg/dL — ABNORMAL HIGH (ref 70–99)
Potassium: 3.9 mmol/L (ref 3.5–5.1)
Sodium: 140 mmol/L (ref 135–145)

## 2019-10-22 LAB — CBC WITH DIFFERENTIAL/PLATELET
Abs Immature Granulocytes: 0.06 10*3/uL (ref 0.00–0.07)
Basophils Absolute: 0 10*3/uL (ref 0.0–0.1)
Basophils Relative: 1 %
Eosinophils Absolute: 0.1 10*3/uL (ref 0.0–0.5)
Eosinophils Relative: 2 %
HCT: 33.6 % — ABNORMAL LOW (ref 36.0–46.0)
Hemoglobin: 11.1 g/dL — ABNORMAL LOW (ref 12.0–15.0)
Immature Granulocytes: 1 %
Lymphocytes Relative: 13 %
Lymphs Abs: 0.9 10*3/uL (ref 0.7–4.0)
MCH: 30.4 pg (ref 26.0–34.0)
MCHC: 33 g/dL (ref 30.0–36.0)
MCV: 92.1 fL (ref 80.0–100.0)
Monocytes Absolute: 0.6 10*3/uL (ref 0.1–1.0)
Monocytes Relative: 9 %
Neutro Abs: 5.2 10*3/uL (ref 1.7–7.7)
Neutrophils Relative %: 74 %
Platelets: 188 10*3/uL (ref 150–400)
RBC: 3.65 MIL/uL — ABNORMAL LOW (ref 3.87–5.11)
RDW: 13.3 % (ref 11.5–15.5)
WBC: 6.9 10*3/uL (ref 4.0–10.5)
nRBC: 0 % (ref 0.0–0.2)

## 2019-10-22 LAB — CK: Total CK: 100 U/L (ref 38–234)

## 2019-10-22 LAB — PROTIME-INR
INR: 0.9 (ref 0.8–1.2)
Prothrombin Time: 12.2 seconds (ref 11.4–15.2)

## 2019-10-22 LAB — APTT: aPTT: 28 seconds (ref 24–36)

## 2019-10-22 MED ORDER — TETANUS-DIPHTH-ACELL PERTUSSIS 5-2.5-18.5 LF-MCG/0.5 IM SUSP
0.5000 mL | Freq: Once | INTRAMUSCULAR | Status: AC
  Administered 2019-10-22: 0.5 mL via INTRAMUSCULAR
  Filled 2019-10-22: qty 0.5

## 2019-10-22 MED ORDER — ACETAMINOPHEN 500 MG PO TABS
1000.0000 mg | ORAL_TABLET | Freq: Once | ORAL | Status: AC
  Administered 2019-10-22: 1000 mg via ORAL
  Filled 2019-10-22: qty 2

## 2019-10-22 NOTE — ED Notes (Signed)
[facility] called for potential transfer spoke with Teresa Richard

## 2019-10-22 NOTE — ED Notes (Signed)
Cleaned pt's face w/ 4x4 gauze and NS

## 2019-10-22 NOTE — ED Triage Notes (Signed)
Pt arrives at ED via [facility] from [facility] following an unwitnessed fall this afternoon. Pt c/o rt elbow pain and has hematoma, bruising and laceration to left eye socket. Pt denies LOC however has hx of memory loss.

## 2019-10-22 NOTE — ED Provider Notes (Signed)
[facility] Emergency Department Provider Note  ____________________________________________   First MD Initiated Contact with Patient 10/22/19 1606     (approximate)  I have reviewed the triage vital signs and the nursing notes.   HISTORY  Chief Complaint Fall    HPI Dakayla Disanti is a 79 y.o. female with dementia, depression, permanent cardiac pacemaker who comes in from [place] for unwitnessed fall.  Patient was found down on the ground.  Unclear how long they were down for.  Patient does have some dementia.  Patient's not really able to tell me when she had the fall.  Patient has bruising on her right elbow, left shoulder and a small laceration by the left eyebrow.  Does not tolerate patient on any blood thinners  Unable to get full HPI due to patient's baseline dementia    Past Medical History:  Diagnosis Date  . 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 Anxiety    due to memory loss  . Dementia (HCC)   . Depression   . Dysrhythmia   . Hypothyroidism   . 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 COVID-19 virus infection 02/16/2019  . B12 deficiency anemia 02/16/2019  . Syncope 02/09/2019  . Pancytopenia (HCC) 02/09/2019  . 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 Thyroid disease 01/23/2014  . 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 BREAST CYST EXCISION Left 1980's   negative  . CATARACT EXTRACTION W/PHACO Left 11/11/2016   Procedure: CATARACT EXTRACTION PHACO AND INTRAOCULAR LENS PLACEMENT (IOC);  Surgeon: Galen Manila, MD;  Location: [facility] ORS;  Service: Ophthalmology;  Laterality: Left;  [place]  00:55 AP% 14.7 CDE 8.15 Fluid pack lot # [id number]  . CTR Right   . EYE SURGERY Bilateral    cataract extractions  . FRACTURE SURGERY Right 2005   LEG. nuts and bolts in knee and foot  . IMPLANTABLE  CARDIOVERTER DEFIBRILLATOR (ICD) GENERATOR CHANGE Left 07/07/2018   Procedure: PACER CHANGEOUT;  Surgeon: Marcina Millard, MD;  Location: [facility] ORS;  Service: Cardiovascular;  Laterality: Left;  . INSERT / REPLACE / REMOVE PACEMAKER    . PACEMAKER INSERTION    . RCR Left 2008    Prior to Admission medications   Medication Sig Start Date End Date Taking? Authorizing Provider  ALPRAZolam (XANAX) 0.25 MG tablet Take 1 tablet (0.25 mg total) by mouth daily as needed for anxiety. 02/17/19   Tyrone Nine, MD  aspirin EC 81 MG tablet Take 81 mg by mouth daily.     [provider]  busPIRone (BUSPAR) 5 MG tablet Take 5 mg by mouth 2 (two) times daily.    [provider]  Cholecalciferol (VITAMIN D3) 1000 units CAPS Take 1,000 Units by mouth daily.     [provider]  Cyanocobalamin (VITAMIN B-12 IJ) Inject 1 application as directed every 4 (four) months. Dr. Marcello Fennel oversees injection. Patient had last dose in 10/19    [provider]  levothyroxine (SYNTHROID, LEVOTHROID) 75 MCG tablet Take 75 mcg by mouth every morning.     [provider]  magnesium hydroxide (MILK OF MAGNESIA) 400 MG/5ML suspension Take 15 mLs by mouth daily as needed for mild constipation.    [provider]  magnesium oxide (MAG-OX) 400 MG tablet Take 400 mg by mouth daily.  [provider]  Melatonin 5 MG TABS Take 5 mg by mouth at bedtime.    [provider]  QUEtiapine (SEROQUEL) 50 MG tablet Take 50 mg by mouth 3 (three) times daily. 0800, 1400, 2000    [provider]    Allergies Hydrocodone and Oxycodone  Family History  Problem Relation Age of Onset  . Breast cancer Neg Hx     Social History Social History   Tobacco Use  . Smoking status: Never Smoker  . Smokeless tobacco: Never Used  Substance Use Topics  . Alcohol use: No  . Drug use: No      Review of Systems Unable to get full review of system due to  dementia ____________________________________________   PHYSICAL EXAM:  VITAL SIGNS: ED Triage Vitals  Enc Vitals Group     BP 10/22/19 1619 133/61     Pulse Rate 10/22/19 1619 61     Resp 10/22/19 1619 10     Temp 10/22/19 1619 99.2 F (37.3 C)     Temp Source 10/22/19 1619 Oral     SpO2 10/22/19 1619 97 %     Weight 10/22/19 1620 120 lb 2.4 oz (54.5 kg)     Height 10/22/19 1620  (1.651 m)     Head Circumference --      Peak Flow --      Pain Score --      Pain Loc --      Pain Edu? --      Excl. in GC? --     Constitutional: Alert . Well appearing and in no acute distress. Eyes: Conjunctivae are normal. EOMI. laceration near the left eyebrow Head: Atraumatic. Nose: No congestion/rhinnorhea. Mouth/Throat: Mucous membranes are moist.   Neck: No stridor. Trachea Midline. FROM Cardiovascular: Normal rate, regular rhythm. Grossly normal heart sounds.  Good peripheral circulation.  ICD palpated Respiratory: Normal respiratory effort.  No retractions. Lungs CTAB. Gastrointestinal: Soft and nontender. No distention. No abdominal bruits.  Musculoskeletal: Bruising with tenderness noted to the left shoulder and the right elbow.  Old bruising noted on other extremities.  Equal grip strength, able to lift both legs up off the bed Neurologic:  Normal speech and language. No gross focal neurologic deficits are appreciated.  Baseline dementia Skin:  Skin is warm, dry and intact. No rash noted. Psychiatric: Unable to fully assess due to baseline dementia GU: Deferred   ____________________________________________   LABS (all labs ordered are listed, but only abnormal results are displayed)  Labs Reviewed  CBC WITH DIFFERENTIAL/PLATELET - Abnormal; Notable for the following components:      Result Value   RBC 3.65 (*)    Hemoglobin 11.1 (*)    HCT 33.6 (*)    All other components within normal limits  BASIC METABOLIC PANEL - Abnormal; Notable for the following components:    Glucose, Bld 107 (*)    BUN 33 (*)    All other components within normal limits  CK  PROTIME-INR  APTT   ____________________________________________   ED ECG REPORT I, Concha Se, the attending physician, personally viewed and interpreted this ECG.  Sinus rate of 61 with left lateral branch block negative scar Bosa, T wave version aVL.  Looks similar to prior ____________________________________________  RADIOLOGY Vela Prose, personally viewed and evaluated these images (plain radiographs) as part of my medical decision making, as well as reviewing the written report by the radiologist.  ED MD interpretation: X-rays no fractures  Official  radiology report(s): DG Elbow Complete Right  Result Date: 10/22/2019 CLINICAL DATA:  Unwitnessed fall.  Right elbow pain and bruising. EXAM: RIGHT ELBOW - COMPLETE 3+ VIEW COMPARISON:  None. FINDINGS: The bones appear mildly demineralized. There is no evidence of acute fracture, dislocation or elbow joint effusion. Mild degenerative changes are noted. IMPRESSION: No acute osseous findings. Mild degenerative changes. Electronically Signed   By: Carey Bullocks M.D.   On: 10/22/2019 18:15   CT Head Wo Contrast  Result Date: 10/22/2019 CLINICAL DATA:  Un witnessed fall, facial bruising EXAM: CT HEAD WITHOUT CONTRAST CT MAXILLOFACIAL WITHOUT CONTRAST CT CERVICAL SPINE WITHOUT CONTRAST TECHNIQUE: Multidetector CT imaging of the head, cervical spine, and maxillofacial structures were performed using the standard protocol without intravenous contrast. Multiplanar CT image reconstructions of the cervical spine and maxillofacial structures were also generated. COMPARISON:  None. 11/15/2018 FINDINGS: CT HEAD FINDINGS Brain: There is a small amount of subarachnoid hemorrhage in the inter hemispheric region anteriorly. A small anterior falcine subdural hematoma is also noted measuring 3 mm in thickness. No mass effect. No evidence of acute infarct. The  lateral ventricles are unremarkable. Vascular: No hyperdense vessel or unexpected calcification. Skull: Normal. Negative for fracture or focal lesion. Other: Left inferior and lateral orbital wall fractures. Please refer to facial CT report. CT MAXILLOFACIAL FINDINGS Osseous: There are minimally displaced fractures involving the inferior and lateral walls of the left orbit, with no evidence of extraocular muscle entrapment. There are minimally displaced fractures of the anterior and lateral walls of the left maxillary sinus, with complete opacification. No other acute facial bone fractures. Orbits: The globes are intact. Extraocular muscles are unremarkable. Sinuses: Opacification of the left maxillary sinus with high attenuation material consistent with blood products. There is minimal polypoid mucosal thickening right maxillary sinus. Mild mucoperiosteal thickening is seen within the ethmoid sinuses. Soft tissues: There is significant soft tissue swelling in the left periorbital and infraorbital tissues. Remaining soft tissues are unremarkable. CT CERVICAL SPINE FINDINGS Alignment: Alignment is anatomic. Skull base and vertebrae: No acute displaced cervical spine fracture. Soft tissues and spinal canal: No prevertebral fluid or swelling. No visible canal hematoma. Disc levels: Mild diffuse facet hypertrophy most pronounced from C3 through C5. Mild diffuse disc space narrowing greatest at C5-6 and C6-7. No bony encroachment upon the central canal or neural foramina. Upper chest: Airway is patent. Lung apices are clear. Other: Reconstructed images demonstrate no additional findings. IMPRESSION: CT head: 1. Small amount of subarachnoid hemorrhage along the medial aspect of the bilateral frontal lobes. 2. Small anterior falcine subdural hematoma measuring 3 mm in thickness. No mass effect. CT MAXILLOFACIAL: 1. Minimally displaced fractures involving the inferior and lateral walls of the left orbit. No evidence of  extraocular muscle entrapment. 2. Minimally displaced fractures of the anterior and lateral walls of the left maxillary sinus. 3. Significant soft tissue swelling in the left periorbital and infraorbital regions. CT CERVICAL SPINE: 1. No evidence of acute displaced cervical spine fracture. 2. Mild multilevel degenerative disc disease and facet hypertrophy. These results were called by telephone at the time of interpretation on 10/22/2019 at 6:04 pm to provider [facility] , who verbally acknowledged these results. Electronically Signed   By: Sharlet Salina M.D.   On: 10/22/2019 18:03   CT Cervical Spine Wo Contrast  Result Date: 10/22/2019 CLINICAL DATA:  Un witnessed fall, facial bruising EXAM: CT HEAD WITHOUT CONTRAST CT MAXILLOFACIAL WITHOUT CONTRAST CT CERVICAL SPINE WITHOUT CONTRAST TECHNIQUE: Multidetector CT imaging of the head, cervical  spine, and maxillofacial structures were performed using the standard protocol without intravenous contrast. Multiplanar CT image reconstructions of the cervical spine and maxillofacial structures were also generated. COMPARISON:  None. 11/15/2018 FINDINGS: CT HEAD FINDINGS Brain: There is a small amount of subarachnoid hemorrhage in the inter hemispheric region anteriorly. A small anterior falcine subdural hematoma is also noted measuring 3 mm in thickness. No mass effect. No evidence of acute infarct. The lateral ventricles are unremarkable. Vascular: No hyperdense vessel or unexpected calcification. Skull: Normal. Negative for fracture or focal lesion. Other: Left inferior and lateral orbital wall fractures. Please refer to facial CT report. CT MAXILLOFACIAL FINDINGS Osseous: There are minimally displaced fractures involving the inferior and lateral walls of the left orbit, with no evidence of extraocular muscle entrapment. There are minimally displaced fractures of the anterior and lateral walls of the left maxillary sinus, with complete opacification. No other acute  facial bone fractures. Orbits: The globes are intact. Extraocular muscles are unremarkable. Sinuses: Opacification of the left maxillary sinus with high attenuation material consistent with blood products. There is minimal polypoid mucosal thickening right maxillary sinus. Mild mucoperiosteal thickening is seen within the ethmoid sinuses. Soft tissues: There is significant soft tissue swelling in the left periorbital and infraorbital tissues. Remaining soft tissues are unremarkable. CT CERVICAL SPINE FINDINGS Alignment: Alignment is anatomic. Skull base and vertebrae: No acute displaced cervical spine fracture. Soft tissues and spinal canal: No prevertebral fluid or swelling. No visible canal hematoma. Disc levels: Mild diffuse facet hypertrophy most pronounced from C3 through C5. Mild diffuse disc space narrowing greatest at C5-6 and C6-7. No bony encroachment upon the central canal or neural foramina. Upper chest: Airway is patent. Lung apices are clear. Other: Reconstructed images demonstrate no additional findings. IMPRESSION: CT head: 1. Small amount of subarachnoid hemorrhage along the medial aspect of the bilateral frontal lobes. 2. Small anterior falcine subdural hematoma measuring 3 mm in thickness. No mass effect. CT MAXILLOFACIAL: 1. Minimally displaced fractures involving the inferior and lateral walls of the left orbit. No evidence of extraocular muscle entrapment. 2. Minimally displaced fractures of the anterior and lateral walls of the left maxillary sinus. 3. Significant soft tissue swelling in the left periorbital and infraorbital regions. CT CERVICAL SPINE: 1. No evidence of acute displaced cervical spine fracture. 2. Mild multilevel degenerative disc disease and facet hypertrophy. These results were called by telephone at the time of interpretation on 10/22/2019 at 6:04 pm to provider [facility] , who verbally acknowledged these results. Electronically Signed   By: Sharlet Salina M.D.   On:  10/22/2019 18:03   DG Shoulder Left  Result Date: 10/22/2019 CLINICAL DATA:  Unwitnessed fall this afternoon. Left shoulder pain and bruising. EXAM: LEFT SHOULDER - 2+ VIEW COMPARISON:  None. FINDINGS: The bones appear demineralized. There is no evidence of acute fracture or dislocation. Postsurgical changes are present in the greater tuberosity. There are mild acromioclavicular and glenohumeral degenerative changes. Possible previous distal clavicle resection. Left subclavian AICD leads are noted. IMPRESSION: No acute osseous findings. Mild degenerative changes. Electronically Signed   By: Carey Bullocks M.D.   On: 10/22/2019 18:14   CT Maxillofacial Wo Contrast  Result Date: 10/22/2019 CLINICAL DATA:  Un witnessed fall, facial bruising EXAM: CT HEAD WITHOUT CONTRAST CT MAXILLOFACIAL WITHOUT CONTRAST CT CERVICAL SPINE WITHOUT CONTRAST TECHNIQUE: Multidetector CT imaging of the head, cervical spine, and maxillofacial structures were performed using the standard protocol without intravenous contrast. Multiplanar CT image reconstructions of the cervical spine and maxillofacial structures  were also generated. COMPARISON:  None. 11/15/2018 FINDINGS: CT HEAD FINDINGS Brain: There is a small amount of subarachnoid hemorrhage in the inter hemispheric region anteriorly. A small anterior falcine subdural hematoma is also noted measuring 3 mm in thickness. No mass effect. No evidence of acute infarct. The lateral ventricles are unremarkable. Vascular: No hyperdense vessel or unexpected calcification. Skull: Normal. Negative for fracture or focal lesion. Other: Left inferior and lateral orbital wall fractures. Please refer to facial CT report. CT MAXILLOFACIAL FINDINGS Osseous: There are minimally displaced fractures involving the inferior and lateral walls of the left orbit, with no evidence of extraocular muscle entrapment. There are minimally displaced fractures of the anterior and lateral walls of the left  maxillary sinus, with complete opacification. No other acute facial bone fractures. Orbits: The globes are intact. Extraocular muscles are unremarkable. Sinuses: Opacification of the left maxillary sinus with high attenuation material consistent with blood products. There is minimal polypoid mucosal thickening right maxillary sinus. Mild mucoperiosteal thickening is seen within the ethmoid sinuses. Soft tissues: There is significant soft tissue swelling in the left periorbital and infraorbital tissues. Remaining soft tissues are unremarkable. CT CERVICAL SPINE FINDINGS Alignment: Alignment is anatomic. Skull base and vertebrae: No acute displaced cervical spine fracture. Soft tissues and spinal canal: No prevertebral fluid or swelling. No visible canal hematoma. Disc levels: Mild diffuse facet hypertrophy most pronounced from C3 through C5. Mild diffuse disc space narrowing greatest at C5-6 and C6-7. No bony encroachment upon the central canal or neural foramina. Upper chest: Airway is patent. Lung apices are clear. Other: Reconstructed images demonstrate no additional findings. IMPRESSION: CT head: 1. Small amount of subarachnoid hemorrhage along the medial aspect of the bilateral frontal lobes. 2. Small anterior falcine subdural hematoma measuring 3 mm in thickness. No mass effect. CT MAXILLOFACIAL: 1. Minimally displaced fractures involving the inferior and lateral walls of the left orbit. No evidence of extraocular muscle entrapment. 2. Minimally displaced fractures of the anterior and lateral walls of the left maxillary sinus. 3. Significant soft tissue swelling in the left periorbital and infraorbital regions. CT CERVICAL SPINE: 1. No evidence of acute displaced cervical spine fracture. 2. Mild multilevel degenerative disc disease and facet hypertrophy. These results were called by telephone at the time of interpretation on 10/22/2019 at 6:04 pm to provider [facility] , who verbally acknowledged these results.  Electronically Signed   By: Sharlet SalinaMichael  Brown M.D.   On: 10/22/2019 18:03    ____________________________________________   PROCEDURES  Procedure(s) performed (including Critical Care):  .Critical Care Performed by: Concha SeFunke, Genessa Beman E, MD Authorized by: Concha SeFunke, Angeleah Labrake E, MD   Critical care provider statement:    Critical care time (minutes):  45   Critical care was necessary to treat or prevent imminent or life-threatening deterioration of the following conditions:  CNS failure or compromise   Critical care was time spent personally by me on the following activities:  Discussions with consultants, evaluation of patient's response to treatment, examination of patient, ordering and performing treatments and interventions, ordering and review of laboratory studies, ordering and review of radiographic studies, pulse oximetry, re-evaluation of patient's condition, obtaining history from patient or surrogate and review of old charts     ____________________________________________   INITIAL IMPRESSION / ASSESSMENT AND PLAN / ED COURSE  Norva PavlovLinda Flett was evaluated in Emergency Department on 10/22/2019 for the symptoms described in the history of present illness. She was evaluated in the context of the global COVID-19 pandemic, which necessitated consideration that the patient  might be at risk for infection with the SARS-CoV-2 virus that causes COVID-19. Institutional protocols and algorithms that pertain to the evaluation of patients at risk for COVID-19 are in a state of rapid change based on information released by regulatory bodies including the CDC and federal and state organizations. These policies and algorithms were followed during the patient's care in the ED.    Patient is a 79 year old who comes in with fall.  Will get CT head evaluate for intracranial hemorrhage, CT face evaluate for facial fracture, CT cervical to evaluate for cervical fracture.  Patient also has some bruising noted on her left  shoulder and right elbow.  Will get some x-rays to further evaluate that as well.  Given unclear exactly when she fell will get labs evaluate for Electra abnormalities, AKI.  I suspect this is most likely mechanical fall but given patient does have an AICD in we will attempt to get interrogate it.   X-rays are negative  CT scan shows small amount of subarachnoid as well small amount of subdural with 3 mm in thickness, no mass-effect. CT face shows minimally displaced fractures involving the inferior and lateral walls of the left orbit with no evidence of extraocular muscle entrapment.  There is also some displacement of the maxillary sinus  Patient's daughter is at bedside who is the POA.  We discussed patient's goals of care given patient is DNR.  I discussed that typically we discuss with [facility] to decide on possibly transfer patient over there due to poly trauma versus  need repeat CT head here and if stable potentially discharging patient home.  However if she stays here if things did get worse we do not have a neurosurgeon to intervene on stuff.  Therefore it depends upon whether or not if things got worse they would want interventions done given her dementia and her DNR status.  Or if they would prefer patient to just undergo comfort care.  She would like to talk to her brothers before making this decision.    They have requested we talked to the [place] first due to prefer to be transferred over there.  She talk to her brothers and they are still trying to decide what they would want to do if things got worse but at this time they are requesting that we talk with [facility] to have him transferred over there.  She understands that there is a chance that she could be discharged from the ER over there.    [employer] pacemaker was interrogated and there were no events.  D/w Dr. Christin Bach acceptd for ED to ED transfer       Clinical Course as of Oct 22 1016  Sat Oct 22, 2019  2148 CT Head Wo Contrast  [MF]    Clinical Course User Index [MF] Vanessa Ethete, MD     ____________________________________________   FINAL CLINICAL IMPRESSION(S) / ED DIAGNOSES   Final diagnoses:  Fall, initial encounter  Subdural hematoma ([place])  Subarachnoid bleed ([place])      MEDICATIONS GIVEN DURING THIS VISIT:  Medications  Tdap (BOOSTRIX) injection 0.5 mL (0.5 mLs Intramuscular Given 10/22/19 1923)  acetaminophen (TYLENOL) tablet 1,000 mg (1,000 mg Oral Given 10/22/19 1831)     ED Discharge Orders    None       Note:  This document was prepared using Dragon voice recognition software and may include unintentional dictation errors.   Vanessa Pulaski, MD 10/23/19 1019

## 2019-10-22 NOTE — ED Notes (Signed)
EMTALA checked for completion prior to ems transport. emtala complete.

## 2019-12-15 ENCOUNTER — Emergency Department
Admission: EM | Admit: 2019-12-15 | Discharge: 2019-12-15 | Disposition: A | Discharge status: Home / Self Care | Payer: Medicare Other | Attending: Emergency Medicine | Admitting: Emergency Medicine

## 2019-12-15 ENCOUNTER — Encounter: Payer: Self-pay | Admitting: Emergency Medicine

## 2019-12-15 DIAGNOSIS — Z7982 Long term (current) use of aspirin: Secondary | ICD-10-CM | POA: Diagnosis not present

## 2019-12-15 DIAGNOSIS — X58XXXA Exposure to other specified factors, initial encounter: Secondary | ICD-10-CM | POA: Insufficient documentation

## 2019-12-15 DIAGNOSIS — Y9389 Activity, other specified: Secondary | ICD-10-CM | POA: Diagnosis not present

## 2019-12-15 DIAGNOSIS — E039 Hypothyroidism, unspecified: Secondary | ICD-10-CM | POA: Insufficient documentation

## 2019-12-15 DIAGNOSIS — S51811A Laceration without foreign body of right forearm, initial encounter: Secondary | ICD-10-CM | POA: Diagnosis present

## 2019-12-15 DIAGNOSIS — Y9289 Other specified places as the place of occurrence of the external cause: Secondary | ICD-10-CM | POA: Diagnosis not present

## 2019-12-15 DIAGNOSIS — S51011A Laceration without foreign body of right elbow, initial encounter: Secondary | ICD-10-CM | POA: Diagnosis not present

## 2019-12-15 DIAGNOSIS — Z950 Presence of cardiac pacemaker: Secondary | ICD-10-CM | POA: Insufficient documentation

## 2019-12-15 DIAGNOSIS — Y998 Other external cause status: Secondary | ICD-10-CM | POA: Diagnosis not present

## 2019-12-15 NOTE — Discharge Instructions (Signed)
Patient's dressing can be changed in 24 hours. Please use nonadherent pad and wrapable gauze.

## 2019-12-15 NOTE — ED Provider Notes (Signed)
Emergency Department Provider Note  ____________________________________________  Time seen: Approximately 11:08 PM  I have reviewed the triage vital signs and the nursing notes.   HISTORY  Chief Complaint Laceration   Historian Patient     HPI Teresa Richard is a 79 y.o. female presents to the emergency department with a skin tear along the right forearm.  Patient was combative earlier today when staff was trying to give her medication and sustained skin tear.  Basic dressings were applied but no other alleviating measures have been attempted.  Tetanus status is up-to-date.   Past Medical History:  Diagnosis Date  . 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 Anxiety    due to memory loss  . Dementia (HCC)   . Depression   . Dysrhythmia   . Hypothyroidism   . Presence of permanent cardiac pacemaker    complete heart block     Immunizations up to date:  Yes.     Past Medical History:  Diagnosis Date  . 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 Anxiety    due to memory loss  . Dementia (HCC)   . Depression   . Dysrhythmia   . Hypothyroidism   . 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 COVID-19 virus infection 02/16/2019  . B12 deficiency anemia 02/16/2019  . Syncope 02/09/2019  . Pancytopenia (HCC) 02/09/2019  . 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 Thyroid disease 01/23/2014  . 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 BREAST CYST EXCISION Left 1980's   negative  . CATARACT EXTRACTION W/PHACO Left 11/11/2016   Procedure: CATARACT EXTRACTION PHACO AND INTRAOCULAR LENS PLACEMENT (IOC);  Surgeon: Galen Manila, MD;  Location: [facility] ORS;  Service: Ophthalmology;  Laterality: Left;  [place]  00:55 AP% 14.7 CDE 8.15 Fluid pack lot # [id number]  . CTR Right   . EYE SURGERY Bilateral    cataract extractions  . FRACTURE SURGERY  Right 2005   LEG. nuts and bolts in knee and foot  . IMPLANTABLE CARDIOVERTER DEFIBRILLATOR (ICD) GENERATOR CHANGE Left 07/07/2018   Procedure: PACER CHANGEOUT;  Surgeon: Marcina Millard, MD;  Location: [facility] ORS;  Service: Cardiovascular;  Laterality: Left;  . INSERT / REPLACE / REMOVE PACEMAKER    . PACEMAKER INSERTION    . RCR Left 2008    Prior to Admission medications   Medication Sig Start Date End Date Taking? Authorizing Provider  ALPRAZolam (XANAX) 0.25 MG tablet Take 1 tablet (0.25 mg total) by mouth daily as needed for anxiety. 02/17/19   Tyrone Nine, MD  aspirin EC 81 MG tablet Take 81 mg by mouth daily.     [provider]  busPIRone (BUSPAR) 5 MG tablet Take 5 mg by mouth 2 (two) times daily.    [provider]  Cholecalciferol (VITAMIN D3) 1000 units CAPS Take 1,000 Units by mouth daily.     [provider]  Cyanocobalamin (VITAMIN B-12 IJ) Inject 1 application as directed every 4 (four) months. Dr. Marcello Fennel oversees injection. Patient had last dose in 10/19    [provider]  levothyroxine (SYNTHROID, LEVOTHROID) 75 MCG tablet Take 75 mcg by mouth every morning.     [provider]  magnesium hydroxide (MILK OF MAGNESIA) 400 MG/5ML suspension Take 15 mLs by mouth daily as needed for mild  constipation.    [provider]  magnesium oxide (MAG-OX) 400 MG tablet Take 400 mg by mouth daily.    [provider]  Melatonin 5 MG TABS Take 5 mg by mouth at bedtime.    [provider]  QUEtiapine (SEROQUEL) 50 MG tablet Take 50 mg by mouth 3 (three) times daily. 0800, 1400, 2000    [provider]    Allergies Hydrocodone and Oxycodone  Family History  Problem Relation Age of Onset  . Breast cancer Neg Hx     Social History Social History   Tobacco Use  . Smoking status: Never Smoker  . Smokeless tobacco: Never Used  Vaping Use  . Vaping Use: Never used  Substance Use Topics  . Alcohol use:  No  . Drug use: No     Review of Systems  Constitutional: No fever/chills Eyes:  No discharge ENT: No upper respiratory complaints. Respiratory: no cough. No SOB/ use of accessory muscles to breath Gastrointestinal:   No nausea, no vomiting.  No diarrhea.  No constipation. Musculoskeletal: Negative for musculoskeletal pain. Skin: Patient has skin tear.     ____________________________________________   PHYSICAL EXAM:  VITAL SIGNS: ED Triage Vitals  Enc Vitals Group     BP 12/15/19 2150 119/63     Pulse Rate 12/15/19 2150 90     Resp 12/15/19 2150 17     Temp 12/15/19 2150 99.3 F (37.4 C)     Temp Source 12/15/19 2150 Oral     SpO2 12/15/19 2150 97 %     Weight --      Height --      Head Circumference --      Peak Flow --      Pain Score 12/15/19 2246 0     Pain Loc --      Pain Edu? --      Excl. in GC? --      Constitutional: Alert and oriented. Well appearing and in no acute distress. Eyes: Conjunctivae are normal. PERRL. EOMI. Head: Atraumatic. Cardiovascular: Normal rate, regular rhythm. Normal S1 and S2.  Good peripheral circulation. Respiratory: Normal respiratory effort without tachypnea or retractions. Lungs CTAB. Good air entry to the bases with no decreased or absent breath sounds Gastrointestinal: Bowel sounds x 4 quadrants. Soft and nontender to palpation. No guarding or rigidity. No distention. Musculoskeletal: Full range of motion to all extremities. No obvious deformities noted Neurologic:  Normal for age. No gross focal neurologic deficits are appreciated.  Skin: Patient has 3 cm skin tear along the dorsal aspect of the right forearm. Psychiatric: Mood and affect are normal for age. Speech and behavior are normal.   ____________________________________________   LABS (all labs ordered are listed, but only abnormal results are displayed)  Labs Reviewed - No data to  display ____________________________________________  EKG   ____________________________________________  RADIOLOGY   No results found.  ____________________________________________    PROCEDURES  Procedure(s) performed:     Marland KitchenMarland KitchenLaceration Repair  Date/Time: 12/15/2019 11:10 PM Performed by: Orvil Feil, PA-C Authorized by: Orvil Feil, PA-C   Consent:    Consent obtained:  Verbal Laceration details:    Location:  Shoulder/arm   Shoulder/arm location:  R lower arm   Length (cm):  3   Depth (mm):  2 Repair type:    Repair type:  Simple Skin repair:    Repair method:  Tissue adhesive       Medications - No data to display  ____________________________________________   INITIAL IMPRESSION / ASSESSMENT AND PLAN / ED COURSE  Pertinent labs & imaging results that were available during my care of the patient were reviewed by me and considered in my medical decision making (see chart for details).      Assessment and plan Skin tear 79 year old female presents to the emergency department with a skin tear along the right forearm.  Laceration was repaired in the emergency department without complication.  Patient education regarding basic wound care was given.  All patient questions were answered.  ____________________________________________  FINAL CLINICAL IMPRESSION(S) / ED DIAGNOSES  Final diagnoses:  Skin tear of right elbow without complication, initial encounter      NEW MEDICATIONS STARTED DURING THIS VISIT:  ED Discharge Orders    None          This chart was dictated using voice recognition software/Dragon. Despite best efforts to proofread, errors can occur which can change the meaning. Any change was purely unintentional.     Orvil Feil, PA-C 12/15/19 2312    Phineas Semen, MD 12/15/19 [phone]

## 2019-12-15 NOTE — ED Triage Notes (Signed)
Pt brought in by daughter. Per daughter pt resides at [facility] and today she was swinging at staff and kit her right forearm. Pt has skin tear to the area. Bandage applied, no bleeding noted. Pt with dementia.

## 2020-05-08 ENCOUNTER — Emergency Department
Admission: EM | Admit: 2020-05-08 | Discharge: 2020-05-08 | Disposition: A | Discharge status: Home / Self Care | Payer: Medicare Other | Attending: Emergency Medicine | Admitting: Emergency Medicine

## 2020-05-08 ENCOUNTER — Emergency Department: Payer: Medicare Other

## 2020-05-08 DIAGNOSIS — W19XXXA Unspecified fall, initial encounter: Secondary | ICD-10-CM | POA: Diagnosis not present

## 2020-05-08 DIAGNOSIS — S0083XA Contusion of other part of head, initial encounter: Secondary | ICD-10-CM | POA: Diagnosis not present

## 2020-05-08 DIAGNOSIS — F0390 Unspecified dementia without behavioral disturbance: Secondary | ICD-10-CM | POA: Insufficient documentation

## 2020-05-08 DIAGNOSIS — Z7982 Long term (current) use of aspirin: Secondary | ICD-10-CM | POA: Insufficient documentation

## 2020-05-08 DIAGNOSIS — Z8616 Personal history of COVID-19: Secondary | ICD-10-CM | POA: Diagnosis not present

## 2020-05-08 DIAGNOSIS — Z950 Presence of cardiac pacemaker: Secondary | ICD-10-CM | POA: Insufficient documentation

## 2020-05-08 DIAGNOSIS — E039 Hypothyroidism, unspecified: Secondary | ICD-10-CM | POA: Insufficient documentation

## 2020-05-08 DIAGNOSIS — Y92002 Bathroom of unspecified non-institutional (private) residence single-family (private) house as the place of occurrence of the external cause: Secondary | ICD-10-CM | POA: Diagnosis not present

## 2020-05-08 DIAGNOSIS — Z79899 Other long term (current) drug therapy: Secondary | ICD-10-CM | POA: Diagnosis not present

## 2020-05-08 DIAGNOSIS — S0990XA Unspecified injury of head, initial encounter: Secondary | ICD-10-CM | POA: Diagnosis present

## 2020-05-08 LAB — URINALYSIS, COMPLETE (UACMP) WITH MICROSCOPIC
Bacteria, UA: NONE SEEN
Bilirubin Urine: NEGATIVE
Glucose, UA: NEGATIVE mg/dL
Hgb urine dipstick: NEGATIVE
Ketones, ur: NEGATIVE mg/dL
Leukocytes,Ua: NEGATIVE
Nitrite: NEGATIVE
Protein, ur: NEGATIVE mg/dL
Specific Gravity, Urine: 1.024 (ref 1.005–1.030)
pH: 5 (ref 5.0–8.0)

## 2020-05-08 LAB — BASIC METABOLIC PANEL
Anion gap: 6 (ref 5–15)
BUN: 34 mg/dL — ABNORMAL HIGH (ref 8–23)
CO2: 34 mmol/L — ABNORMAL HIGH (ref 22–32)
Calcium: 9.3 mg/dL (ref 8.9–10.3)
Chloride: 101 mmol/L (ref 98–111)
Creatinine, Ser: 0.99 mg/dL (ref 0.44–1.00)
GFR, Estimated: 58 mL/min — ABNORMAL LOW (ref >60)
Glucose, Bld: 97 mg/dL (ref 70–99)
Potassium: 3.8 mmol/L (ref 3.5–5.1)
Sodium: 141 mmol/L (ref 135–145)

## 2020-05-08 LAB — CBC
HCT: 32.9 % — ABNORMAL LOW (ref 36.0–46.0)
Hemoglobin: 10.5 g/dL — ABNORMAL LOW (ref 12.0–15.0)
MCH: 29.7 pg (ref 26.0–34.0)
MCHC: 31.9 g/dL (ref 30.0–36.0)
MCV: 93.2 fL (ref 80.0–100.0)
Platelets: 152 10*3/uL (ref 150–400)
RBC: 3.53 MIL/uL — ABNORMAL LOW (ref 3.87–5.11)
RDW: 13.8 % (ref 11.5–15.5)
WBC: 4.2 10*3/uL (ref 4.0–10.5)
nRBC: 0 % (ref 0.0–0.2)

## 2020-05-08 MED ORDER — ACETAMINOPHEN 500 MG PO TABS
1000.0000 mg | ORAL_TABLET | Freq: Once | ORAL | Status: AC
  Administered 2020-05-08: 1000 mg via ORAL
  Filled 2020-05-08: qty 2

## 2020-05-08 MED ORDER — ALPRAZOLAM 0.5 MG PO TABS
0.2500 mg | ORAL_TABLET | Freq: Once | ORAL | Status: AC
  Administered 2020-05-08: 0.25 mg via ORAL
  Filled 2020-05-08: qty 1

## 2020-05-08 NOTE — ED Provider Notes (Signed)
[facility] Emergency Department Provider Note  ____________________________________________  Time seen: Approximately 2:41 AM  I have reviewed the triage vital signs and the nursing notes.   HISTORY  Chief Complaint Fall (pt at memory facility, fall in bathroom , unknown loc, pt more alterered, left side of head bruising , right arm and flank pain)  Level 5 caveat:  Portions of the history and physical were unable to be obtained due to dementia   HPI Teresa Richard is a 79 y.o. female history of dementia, anemia, complete heart block status post pacemaker, hypothyroidism, syncope who presents for evaluation of unwitnessed fall from her memory care facility. Patient does not remember falling. She was found right next to her bed in her room. Her daughter is at bedside and reports the patient is currently at her baseline. Patient denies headache, neck pain, back pain, chest pain, abdominal pain, extremity pain.   Past Medical History:  Diagnosis Date  . 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 Anxiety    due to memory loss  . Dementia (HCC)   . Depression   . Dysrhythmia   . Hypothyroidism   . 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 COVID-19 virus infection 02/16/2019  . B12 deficiency anemia 02/16/2019  . Syncope 02/09/2019  . Pancytopenia (HCC) 02/09/2019  . 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 Thyroid disease 01/23/2014  . 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 BREAST CYST EXCISION Left 1980's   negative  . CATARACT EXTRACTION W/PHACO Left 11/11/2016   Procedure: CATARACT EXTRACTION PHACO AND INTRAOCULAR LENS PLACEMENT (IOC);  Surgeon: Galen Manila, MD;  Location: [facility] ORS;  Service: Ophthalmology;  Laterality: Left;  [place]  00:55 AP% 14.7 CDE 8.15 Fluid pack lot # [id number]  . CTR Right   . EYE  SURGERY Bilateral    cataract extractions  . FRACTURE SURGERY Right 2005   LEG. nuts and bolts in knee and foot  . IMPLANTABLE CARDIOVERTER DEFIBRILLATOR (ICD) GENERATOR CHANGE Left 07/07/2018   Procedure: PACER CHANGEOUT;  Surgeon: Marcina Millard, MD;  Location: [facility] ORS;  Service: Cardiovascular;  Laterality: Left;  . INSERT / REPLACE / REMOVE PACEMAKER    . PACEMAKER INSERTION    . RCR Left 2008    Prior to Admission medications   Medication Sig Start Date End Date Taking? Authorizing Provider  ALPRAZolam (XANAX) 0.25 MG tablet Take 1 tablet (0.25 mg total) by mouth daily as needed for anxiety. 02/17/19   Tyrone Nine, MD  aspirin EC 81 MG tablet Take 81 mg by mouth daily.     [provider]  busPIRone (BUSPAR) 5 MG tablet Take 5 mg by mouth 2 (two) times daily.    [provider]  Cholecalciferol (VITAMIN D3) 1000 units CAPS Take 1,000 Units by mouth daily.     [provider]  Cyanocobalamin (VITAMIN B-12 IJ) Inject 1 application as directed every 4 (four) months. Dr. Marcello Fennel oversees injection. Patient had last dose in 10/19    [provider]  levothyroxine (SYNTHROID, LEVOTHROID) 75 MCG tablet Take 75 mcg by mouth every morning.     [provider]  magnesium hydroxide (MILK OF MAGNESIA) 400 MG/5ML suspension Take 15 mLs by mouth daily as needed for mild constipation.    [provider]  magnesium oxide (  MAG-OX) 400 MG tablet Take 400 mg by mouth daily.    [provider]  Melatonin 5 MG TABS Take 5 mg by mouth at bedtime.    [provider]  QUEtiapine (SEROQUEL) 50 MG tablet Take 50 mg by mouth 3 (three) times daily. 0800, 1400, 2000    [provider]    Allergies Hydrocodone and Oxycodone  Family History  Problem Relation Age of Onset  . Breast cancer Neg Hx     Social History Social History   Tobacco Use  . Smoking status: Never Smoker  . Smokeless tobacco: Never Used  Vaping Use   . Vaping Use: Never used  Substance Use Topics  . Alcohol use: No  . Drug use: No    Review of Systems  Constitutional: Negative for fever. Eyes: Negative for visual changes. ENT: Negative for facial injury or neck injury Cardiovascular: Negative for chest injury. Respiratory: Negative for shortness of breath. Negative for chest wall injury. Gastrointestinal: Negative for abdominal pain or injury. Genitourinary: Negative for dysuria. Musculoskeletal: Negative for back injury, negative for arm or leg pain. Skin: Negative for laceration/abrasions. Neurological: + head injury.   ____________________________________________   PHYSICAL EXAM:  VITAL SIGNS: ED Triage Vitals  Enc Vitals Group     BP 05/08/20 0159 121/72     Pulse Rate 05/08/20 0159 80     Resp 05/08/20 0159 17     Temp 05/08/20 0159 97.6 F (36.4 C)     Temp Source 05/08/20 0159 Oral     SpO2 --      Weight 05/08/20 0200 121 lb 4.1 oz (55 kg)     Height 05/08/20 0200 5\' 7"  (1.702 m)     Head Circumference --      Peak Flow --      Pain Score --      Pain Loc --      Pain Edu? --      Excl. in GC? --     Full spinal precautions maintained throughout the trauma exam. Constitutional: Alert and oriented. No acute distress. Does not appear intoxicated. HEENT Head: Normocephalic. Small L forehead hematoma Face: No facial bony tenderness. Stable midface Ears: No hemotympanum bilaterally. No Battle sign Eyes: No eye injury. PERRL. No raccoon eyes Nose: Nontender. No epistaxis. No rhinorrhea Mouth/Throat: Mucous membranes are moist. No oropharyngeal blood. No dental injury. Airway patent without stridor. Normal voice. Neck: no C-collar. No midline c-spine tenderness.  Cardiovascular: Normal rate, regular rhythm. Normal and symmetric distal pulses are present in all extremities. Pulmonary/Chest: Chest wall is stable and nontender to palpation/compression. Normal respiratory effort. Breath sounds are normal. No  crepitus.  Abdominal: Soft, nontender, non distended. Musculoskeletal: Nontender with normal full range of motion in all extremities. No deformities. No thoracic or lumbar midline spinal tenderness. Pelvis is stable. Skin: Skin is warm, dry and intact. No abrasions or contutions. Psychiatric: Speech and behavior are appropriate. Neurological: Normal speech and language. Moves all extremities to command. No gross focal neurologic deficits are appreciated.  Glascow Coma Score: 4 - Opens eyes on own 6 - Follows simple motor commands 4 - Seems confused, disoriented GCS: 14   ____________________________________________   LABS (all labs ordered are listed, but only abnormal results are displayed)  Labs Reviewed  BASIC METABOLIC PANEL - Abnormal; Notable for the following components:      Result Value   CO2 34 (*)    BUN 34 (*)    GFR, Estimated 58 (*)  All other components within normal limits  CBC - Abnormal; Notable for the following components:   RBC 3.53 (*)    Hemoglobin 10.5 (*)    HCT 32.9 (*)    All other components within normal limits  URINALYSIS, COMPLETE (UACMP) WITH MICROSCOPIC - Abnormal; Notable for the following components:   Color, Urine YELLOW (*)    APPearance HAZY (*)    All other components within normal limits  CBG MONITORING, ED   ____________________________________________  EKG  ED ECG REPORT I, Nita Sickle, the attending physician, personally viewed and interpreted this ECG.  Dual paced rhythm, rate of 63, no concordant ST elevation. ____________________________________________  RADIOLOGY  I have personally reviewed the images performed during this visit and I agree with the Radiologist's read.   Interpretation by Radiologist:  CT HEAD WO CONTRAST  Result Date: 05/08/2020 CLINICAL DATA:  Head trauma due to fall EXAM: CT HEAD WITHOUT CONTRAST CT CERVICAL SPINE WITHOUT CONTRAST TECHNIQUE: Multidetector CT imaging of the head and  cervical spine was performed following the standard protocol without intravenous contrast. Multiplanar CT image reconstructions of the cervical spine were also generated. COMPARISON:  10/22/2019 FINDINGS: CT HEAD FINDINGS Brain: No evidence of acute infarction, hemorrhage, hydrocephalus, extra-axial collection or mass lesion/mass effect. Medial temporal thinning in the setting of dementia. Vascular: No hyperdense vessel or unexpected calcification. Skull: Left anterior scalp hematoma without calvarial fracture. Sinuses/Orbits: Bilateral cataract resection CT CERVICAL SPINE FINDINGS Alignment: No traumatic malalignment. Slight C4-5 degenerative anterolisthesis. Skull base and vertebrae: No acute fracture. No primary bone lesion or focal pathologic process. Soft tissues and spinal canal: No prevertebral fluid or swelling. No visible canal hematoma. Disc levels:  Ordinary degenerative changes for age Upper chest: Negative IMPRESSION: 1. No evidence of intracranial or cervical spine injury. 2. Scalp hematoma without fracture. Electronically Signed   By: Marnee Spring M.D.   On: 05/08/2020 04:22   CT Cervical Spine Wo Contrast  Result Date: 05/08/2020 CLINICAL DATA:  Head trauma due to fall EXAM: CT HEAD WITHOUT CONTRAST CT CERVICAL SPINE WITHOUT CONTRAST TECHNIQUE: Multidetector CT imaging of the head and cervical spine was performed following the standard protocol without intravenous contrast. Multiplanar CT image reconstructions of the cervical spine were also generated. COMPARISON:  10/22/2019 FINDINGS: CT HEAD FINDINGS Brain: No evidence of acute infarction, hemorrhage, hydrocephalus, extra-axial collection or mass lesion/mass effect. Medial temporal thinning in the setting of dementia. Vascular: No hyperdense vessel or unexpected calcification. Skull: Left anterior scalp hematoma without calvarial fracture. Sinuses/Orbits: Bilateral cataract resection CT CERVICAL SPINE FINDINGS Alignment: No traumatic  malalignment. Slight C4-5 degenerative anterolisthesis. Skull base and vertebrae: No acute fracture. No primary bone lesion or focal pathologic process. Soft tissues and spinal canal: No prevertebral fluid or swelling. No visible canal hematoma. Disc levels:  Ordinary degenerative changes for age Upper chest: Negative IMPRESSION: 1. No evidence of intracranial or cervical spine injury. 2. Scalp hematoma without fracture. Electronically Signed   By: Marnee Spring M.D.   On: 05/08/2020 04:22      ____________________________________________   PROCEDURES  Procedure(s) performed:yes .1-3 Lead EKG Interpretation Performed by: Nita Sickle, MD Authorized by: Nita Sickle, MD     Interpretation: abnormal     ECG rate assessment: normal     Rhythm comment:  Paced   Critical Care performed:  None ____________________________________________   INITIAL IMPRESSION / ASSESSMENT AND PLAN / ED COURSE   79 y.o. female history of dementia, anemia, complete heart block status post pacemaker, hypothyroidism, syncope who presents for  evaluation of unwitnessed fall from her memory care facility. Patient has no recollection of the fall. Her daughter is at bedside and reports the patient is currently at her baseline. She has a small left forehead hematoma but no other obvious injuries from her fall. We will interrogate her Pacemaker to rule out syncope or ventricular dysrhythmias. EKG shows functioning pacemaker with normal paced rhythm. We will check blood work to rule out dehydration, UTI, sepsis, anemia, or electrolyte derangements as possible etiologies for patient's fall. Will monitor closely on telemetry. Will get CT head and cervical spine to rule out traumatic injury. Will give p.o. Tylenol for head trauma. Old medical records reviewed. Care discussed with patient's daughter who is in agreement.  _________________________ 5:05 AM on  05/08/2020 -----------------------------------------  Blood work with no acute findings.  CT head and cervical spine no traumatic injuries.  Unfortunately our [employer] reader is not working.  After 2 hours of calling [employer] we were finally able to get a hold of the tech was on call tonight.  I spoke with him on the phone. He was very reluctant to come in since he lives in [place] but agreed to come in to interrogate her pacemaker. Patient is getting slightly agitated at this time. Daughter reports that she gets xanax when she gets like this. Confirmed that patient is on xanax 0.25mg  PRN. Will give her a dose at this time.     _________________________ 7:14 AM on 05/08/2020 -----------------------------------------  Patient interrogated by [employer] technician with no signs of V. tach or V. fib.  Seems to be working appropriately.  Patient remained stable and with a negative work-up.  Patient be discharged back to her nursing home.  Results and plan discussed with patient's daughter who has been at her bedside the entire time and she is in agreement.  ____________________________________________  Please note:  Patient was evaluated in Emergency Department today for the symptoms described in the history of present illness. Patient was evaluated in the context of the global COVID-19 pandemic, which necessitated consideration that the patient might be at risk for infection with the SARS-CoV-2 virus that causes COVID-19. Institutional protocols and algorithms that pertain to the evaluation of patients at risk for COVID-19 are in a state of rapid change based on information released by regulatory bodies including the CDC and federal and state organizations. These policies and algorithms were followed during the patient's care in the ED.  Some ED evaluations and interventions may be delayed as a result of limited staffing during the pandemic.   ____________________________________________   FINAL  CLINICAL IMPRESSION(S) / ED DIAGNOSES   Final diagnoses:  Fall, initial encounter  Traumatic hematoma of forehead, initial encounter      NEW MEDICATIONS STARTED DURING THIS VISIT:  ED Discharge Orders    None       Note:  This document was prepared using Dragon voice recognition software and may include unintentional dictation errors.    Don PerkingVeronese, [place], MD 05/08/20 [phone]

## 2020-05-08 NOTE — ED Notes (Signed)
[employer] representative at bedside with patient.

## 2020-05-08 NOTE — ED Notes (Signed)
Pt refusing vital signs, agitated.

## 2020-05-08 NOTE — ED Notes (Signed)
Called [employer] number again and had page resent to local representative.

## 2020-05-08 NOTE — ED Triage Notes (Signed)
pt at memory facility [facility] , fall in bathroom  Pt has a [employer] pacemaker , unknown loc, pt more alterered, left side of head bruising , right arm and flank pain, pt is confused at baseline per ems more confusion , pt's daughter is at bedside.

## 2020-05-08 NOTE — ED Notes (Signed)
[employer] interrogator not responding.  [employer] help number contacted and information given.  Representative, Fleet Contras, states local [employer] rep will be paged to do in person interrogation.

## 2020-05-08 NOTE — Discharge Instructions (Addendum)

## 2020-05-08 NOTE — ED Notes (Signed)
Viviann Spare RN interrogating [employer] pacemaker

## 2020-05-08 NOTE — ED Notes (Signed)
Patient discharged to home , daughter at bedside to take back to NH. Original DNR given to daughter.

## 2020-08-27 ENCOUNTER — Emergency Department
Admission: EM | Admit: 2020-08-27 | Discharge: 2020-08-27 | Disposition: A | Discharge status: Home / Self Care | Payer: Medicare Other | Attending: Emergency Medicine | Admitting: Emergency Medicine

## 2020-08-27 ENCOUNTER — Emergency Department: Payer: Medicare Other

## 2020-08-27 DIAGNOSIS — E039 Hypothyroidism, unspecified: Secondary | ICD-10-CM | POA: Insufficient documentation

## 2020-08-27 DIAGNOSIS — R29810 Facial weakness: Secondary | ICD-10-CM | POA: Diagnosis present

## 2020-08-27 DIAGNOSIS — Z950 Presence of cardiac pacemaker: Secondary | ICD-10-CM | POA: Insufficient documentation

## 2020-08-27 DIAGNOSIS — Z7982 Long term (current) use of aspirin: Secondary | ICD-10-CM | POA: Diagnosis not present

## 2020-08-27 DIAGNOSIS — Z79899 Other long term (current) drug therapy: Secondary | ICD-10-CM | POA: Insufficient documentation

## 2020-08-27 DIAGNOSIS — Z8616 Personal history of COVID-19: Secondary | ICD-10-CM | POA: Diagnosis not present

## 2020-08-27 DIAGNOSIS — F0390 Unspecified dementia without behavioral disturbance: Secondary | ICD-10-CM | POA: Diagnosis not present

## 2020-08-27 LAB — BASIC METABOLIC PANEL
Anion gap: 8 (ref 5–15)
BUN: 30 mg/dL — ABNORMAL HIGH (ref 8–23)
CO2: 33 mmol/L — ABNORMAL HIGH (ref 22–32)
Calcium: 9.1 mg/dL (ref 8.9–10.3)
Chloride: 100 mmol/L (ref 98–111)
Creatinine, Ser: 0.86 mg/dL (ref 0.44–1.00)
GFR, Estimated: >60 mL/min (ref >60)
Glucose, Bld: 95 mg/dL (ref 70–99)
Potassium: 4.1 mmol/L (ref 3.5–5.1)
Sodium: 141 mmol/L (ref 135–145)

## 2020-08-27 LAB — CBC WITH DIFFERENTIAL/PLATELET
Abs Immature Granulocytes: 0.02 10*3/uL (ref 0.00–0.07)
Basophils Absolute: 0 10*3/uL (ref 0.0–0.1)
Basophils Relative: 1 %
Eosinophils Absolute: 0.1 10*3/uL (ref 0.0–0.5)
Eosinophils Relative: 2 %
HCT: 33.6 % — ABNORMAL LOW (ref 36.0–46.0)
Hemoglobin: 10.7 g/dL — ABNORMAL LOW (ref 12.0–15.0)
Immature Granulocytes: 0 %
Lymphocytes Relative: 19 %
Lymphs Abs: 1 10*3/uL (ref 0.7–4.0)
MCH: 29.5 pg (ref 26.0–34.0)
MCHC: 31.8 g/dL (ref 30.0–36.0)
MCV: 92.6 fL (ref 80.0–100.0)
Monocytes Absolute: 0.4 10*3/uL (ref 0.1–1.0)
Monocytes Relative: 8 %
Neutro Abs: 3.5 10*3/uL (ref 1.7–7.7)
Neutrophils Relative %: 70 %
Platelets: 161 10*3/uL (ref 150–400)
RBC: 3.63 MIL/uL — ABNORMAL LOW (ref 3.87–5.11)
RDW: 15.9 % — ABNORMAL HIGH (ref 11.5–15.5)
WBC: 5 10*3/uL (ref 4.0–10.5)
nRBC: 0 % (ref 0.0–0.2)

## 2020-08-27 LAB — TROPONIN I (HIGH SENSITIVITY): Troponin I (High Sensitivity): 7 ng/L (ref <18)

## 2020-08-27 NOTE — ED Provider Notes (Addendum)
[facility] Emergency Department Provider Note   ____________________________________________   Event Date/Time   First MD Initiated Contact with Patient 08/27/20 1241     (approximate)  I have reviewed the triage vital signs and the nursing notes.   HISTORY  Chief Complaint Facial Droop (Since yesterday 3 pm ????, no deficits seen upon arrival, dementia)    HPI Teresa Richard is a 80 y.o. female with past medical history of dementia, anemia, and hypothyroidism who presents to the ED for facial droop.  History is limited due to patient's baseline dementia and majority of history is obtained from EMS.  They state that nurse at patient's care facility was concerned she had developed a facial droop that was worsening through the course of the day today.  Patient was last known well at 3 PM yesterday, when that nurse had last cared for the patient.  She initially noticed the right-sided facial droop when arriving to care for the patient and when it persisted through the day, she eventually called EMS.  Patient has otherwise been at her baseline mental status and currently denies any complaints.  She specifically denies any numbness, weakness, chest pain, shortness of breath, or pain.  No focal neurologic deficits noted by EMS.        Past Medical History:  Diagnosis Date  . 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 Anxiety    due to memory loss  . Dementia (HCC)   . Depression   . Dysrhythmia   . Hypothyroidism   . 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 COVID-19 virus infection 02/16/2019  . B12 deficiency anemia 02/16/2019  . Syncope 02/09/2019  . Pancytopenia (HCC) 02/09/2019  . 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 Thyroid disease 01/23/2014  . 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 BREAST CYST EXCISION  Left 1980's   negative  . CATARACT EXTRACTION W/PHACO Left 11/11/2016   Procedure: CATARACT EXTRACTION PHACO AND INTRAOCULAR LENS PLACEMENT (IOC);  Surgeon: Galen Manila, MD;  Location: [facility] ORS;  Service: Ophthalmology;  Laterality: Left;  [place]  00:55 AP% 14.7 CDE 8.15 Fluid pack lot # [id number]  . CTR Right   . EYE SURGERY Bilateral    cataract extractions  . FRACTURE SURGERY Right 2005   LEG. nuts and bolts in knee and foot  . IMPLANTABLE CARDIOVERTER DEFIBRILLATOR (ICD) GENERATOR CHANGE Left 07/07/2018   Procedure: PACER CHANGEOUT;  Surgeon: Marcina Millard, MD;  Location: [facility] ORS;  Service: Cardiovascular;  Laterality: Left;  . INSERT / REPLACE / REMOVE PACEMAKER    . PACEMAKER INSERTION    . RCR Left 2008    Prior to Admission medications   Medication Sig Start Date End Date Taking? Authorizing Provider  ALPRAZolam (XANAX) 0.25 MG tablet Take 1 tablet (0.25 mg total) by mouth daily as needed for anxiety. 02/17/19   Tyrone Nine, MD  aspirin EC 81 MG tablet Take 81 mg by mouth daily.     [provider]  busPIRone (BUSPAR) 5 MG tablet Take 5 mg by mouth 2 (two) times daily.    [provider]  Cholecalciferol (VITAMIN D3) 1000 units CAPS Take 1,000 Units by mouth daily.     [provider]  Cyanocobalamin (VITAMIN B-12 IJ) Inject 1 application as directed every 4 (four) months. Dr.  Hande oversees injection. Patient had last dose in 10/19    [provider]  levothyroxine (SYNTHROID, LEVOTHROID) 75 MCG tablet Take 75 mcg by mouth every morning.     [provider]  magnesium hydroxide (MILK OF MAGNESIA) 400 MG/5ML suspension Take 15 mLs by mouth daily as needed for mild constipation.    [provider]  magnesium oxide (MAG-OX) 400 MG tablet Take 400 mg by mouth daily.    [provider]  Melatonin 5 MG TABS Take 5 mg by mouth at bedtime.    [provider]  QUEtiapine (SEROQUEL) 50 MG tablet Take 50 mg by  mouth 3 (three) times daily. 0800, 1400, 2000    [provider]    Allergies Hydrocodone and Oxycodone  Family History  Problem Relation Age of Onset  . Breast cancer Neg Hx     Social History Social History   Tobacco Use  . Smoking status: Never Smoker  . Smokeless tobacco: Never Used  Vaping Use  . Vaping Use: Never used  Substance Use Topics  . Alcohol use: No  . Drug use: No    Review of Systems  Constitutional: No fever/chills Eyes: No visual changes. ENT: No sore throat. Cardiovascular: Denies chest pain. Respiratory: Denies shortness of breath. Gastrointestinal: No abdominal pain.  No nausea, no vomiting.  No diarrhea.  No constipation. Genitourinary: Negative for dysuria. Musculoskeletal: Negative for back pain. Skin: Negative for rash. Neurological: Negative for headaches, focal weakness or numbness.  Positive for facial droop.  ____________________________________________   PHYSICAL EXAM:  VITAL SIGNS: ED Triage Vitals [08/27/20 1237]  Enc Vitals Group     BP (!) 111/50     Pulse Rate 65     Resp 17     Temp 98.1 F (36.7 C)     Temp Source Oral     SpO2 96 %     Weight      Height      Head Circumference      Peak Flow      Pain Score      Pain Loc      Pain Edu?      Excl. in GC?     Constitutional: Alert and oriented to person, but not place or time. Eyes: Conjunctivae are normal. Head: Atraumatic. Nose: No congestion/rhinnorhea. Mouth/Throat: Mucous membranes are moist. Neck: Normal ROM Cardiovascular: Normal rate, regular rhythm. Grossly normal heart sounds. Respiratory: Normal respiratory effort.  No retractions. Lungs CTAB. Gastrointestinal: Soft and nontender. No distention. Genitourinary: deferred Musculoskeletal: No lower extremity tenderness nor edema. Neurologic:  Normal speech and language. No gross focal neurologic deficits are appreciated, no facial droop appreciated. Skin:  Skin is warm, dry and intact. No  rash noted. Psychiatric: Mood and affect are normal. Speech and behavior are normal.  ____________________________________________   LABS (all labs ordered are listed, but only abnormal results are displayed)  Labs Reviewed  CBC WITH DIFFERENTIAL/PLATELET - Abnormal; Notable for the following components:      Result Value   RBC 3.63 (*)    Hemoglobin 10.7 (*)    HCT 33.6 (*)    RDW 15.9 (*)    All other components within normal limits  BASIC METABOLIC PANEL - Abnormal; Notable for the following components:   CO2 33 (*)    BUN 30 (*)    All other components within normal limits  URINALYSIS, COMPLETE (UACMP) WITH MICROSCOPIC  TROPONIN I (HIGH SENSITIVITY)   ____________________________________________  EKG  ED ECG  REPORT I, Chesley Noon, the attending physician, personally viewed and interpreted this ECG.   Date: 08/27/2020  EKG Time: 13:00  Rate: 75  Rhythm: Paced rhythm  Axis: Normal  Intervals:none  ST&T Change: Interpretation limited secondary to artifact   PROCEDURES  Procedure(s) performed (including Critical Care):  Procedures   ____________________________________________   INITIAL IMPRESSION / ASSESSMENT AND PLAN / ED COURSE       80 year old female with past medical history of dementia, anemia, and hypothyroidism who presents to the ED due to concern for possible right-sided facial droop.  Patient with a last known well time of 3 PM yesterday and therefore is not a candidate for any acute intervention on possible stroke.  She also does not appear to have any focal neurologic deficits on my assessment, and no clear facial droop noted.  We will check EKG, labs, UA, and CT head.  Patient currently denies any complaints.  Lab work is unremarkable and CT head reviewed by me, no obvious hemorrhage and negative for acute process per radiology.  Daughter is now at bedside and states patient is at her baseline without facial droop.  There are again no signs of  focal neurologic deficit on reassessment and I doubt acute stroke.  She was recently treated for UTI when she was having increased hallucinations, but given she is at her baseline mental status with no urinary symptoms, daughter prefers to hold off on checking UA.  Patient has follow-up with her PCP tomorrow and daughter counseled to have her return to the ED for any new or worsening symptoms.      ____________________________________________   FINAL CLINICAL IMPRESSION(S) / ED DIAGNOSES  Final diagnoses:  Dementia without behavioral disturbance, unspecified dementia type [facility])     ED Discharge Orders    None       Note:  This document was prepared using Dragon voice recognition software and may include unintentional dictation errors.     Chesley Noon, MD 08/27/20 1355

## 2020-08-27 NOTE — ED Triage Notes (Signed)
Facial droop per caregiver, dementia

## 2020-08-27 NOTE — ED Notes (Signed)
pts daughter robin Lorenda Peck  is here and verbalizes dc instructions all questions answered

## 2020-10-10 ENCOUNTER — Emergency Department
Admission: EM | Admit: 2020-10-10 | Discharge: 2020-10-10 | Disposition: A | Discharge status: Home / Self Care | Payer: Medicare Other | Attending: Emergency Medicine | Admitting: Emergency Medicine

## 2020-10-10 ENCOUNTER — Emergency Department: Payer: Medicare Other

## 2020-10-10 ENCOUNTER — Encounter: Payer: Self-pay | Admitting: Emergency Medicine

## 2020-10-10 ENCOUNTER — Other Ambulatory Visit: Payer: Self-pay

## 2020-10-10 DIAGNOSIS — W19XXXA Unspecified fall, initial encounter: Secondary | ICD-10-CM | POA: Diagnosis not present

## 2020-10-10 DIAGNOSIS — Z8616 Personal history of COVID-19: Secondary | ICD-10-CM | POA: Insufficient documentation

## 2020-10-10 DIAGNOSIS — Z79899 Other long term (current) drug therapy: Secondary | ICD-10-CM | POA: Insufficient documentation

## 2020-10-10 DIAGNOSIS — Z950 Presence of cardiac pacemaker: Secondary | ICD-10-CM | POA: Diagnosis not present

## 2020-10-10 DIAGNOSIS — Z7982 Long term (current) use of aspirin: Secondary | ICD-10-CM | POA: Insufficient documentation

## 2020-10-10 DIAGNOSIS — Y92252 Music hall as the place of occurrence of the external cause: Secondary | ICD-10-CM | POA: Insufficient documentation

## 2020-10-10 DIAGNOSIS — E039 Hypothyroidism, unspecified: Secondary | ICD-10-CM | POA: Diagnosis not present

## 2020-10-10 DIAGNOSIS — F0390 Unspecified dementia without behavioral disturbance: Secondary | ICD-10-CM | POA: Diagnosis present

## 2020-10-10 LAB — CBC WITH DIFFERENTIAL/PLATELET
Abs Immature Granulocytes: 0.03 10*3/uL (ref 0.00–0.07)
Basophils Absolute: 0 10*3/uL (ref 0.0–0.1)
Basophils Relative: 1 %
Eosinophils Absolute: 0.1 10*3/uL (ref 0.0–0.5)
Eosinophils Relative: 1 %
HCT: 31.6 % — ABNORMAL LOW (ref 36.0–46.0)
Hemoglobin: 10.2 g/dL — ABNORMAL LOW (ref 12.0–15.0)
Immature Granulocytes: 1 %
Lymphocytes Relative: 22 %
Lymphs Abs: 0.8 10*3/uL (ref 0.7–4.0)
MCH: 30.4 pg (ref 26.0–34.0)
MCHC: 32.3 g/dL (ref 30.0–36.0)
MCV: 94.3 fL (ref 80.0–100.0)
Monocytes Absolute: 0.4 10*3/uL (ref 0.1–1.0)
Monocytes Relative: 10 %
Neutro Abs: 2.5 10*3/uL (ref 1.7–7.7)
Neutrophils Relative %: 65 %
Platelets: 142 10*3/uL — ABNORMAL LOW (ref 150–400)
RBC: 3.35 MIL/uL — ABNORMAL LOW (ref 3.87–5.11)
RDW: 14.8 % (ref 11.5–15.5)
WBC: 3.8 10*3/uL — ABNORMAL LOW (ref 4.0–10.5)
nRBC: 0 % (ref 0.0–0.2)

## 2020-10-10 LAB — BASIC METABOLIC PANEL
Anion gap: 6 (ref 5–15)
BUN: 32 mg/dL — ABNORMAL HIGH (ref 8–23)
CO2: 32 mmol/L (ref 22–32)
Calcium: 9 mg/dL (ref 8.9–10.3)
Chloride: 104 mmol/L (ref 98–111)
Creatinine, Ser: 0.82 mg/dL (ref 0.44–1.00)
GFR, Estimated: >60 mL/min (ref >60)
Glucose, Bld: 82 mg/dL (ref 70–99)
Potassium: 4 mmol/L (ref 3.5–5.1)
Sodium: 142 mmol/L (ref 135–145)

## 2020-10-10 LAB — TROPONIN I (HIGH SENSITIVITY): Troponin I (High Sensitivity): 5 ng/L (ref <18)

## 2020-10-10 MED ORDER — LACTATED RINGERS IV BOLUS
1000.0000 mL | Freq: Once | INTRAVENOUS | Status: AC
  Administered 2020-10-10: 1000 mL via INTRAVENOUS

## 2020-10-10 MED ORDER — BUSPIRONE HCL 5 MG PO TABS
5.0000 mg | ORAL_TABLET | Freq: Two times a day (BID) | ORAL | Status: DC
  Administered 2020-10-10: 5 mg via ORAL
  Filled 2020-10-10: qty 1

## 2020-10-10 MED ORDER — ACETAMINOPHEN 500 MG PO TABS
1000.0000 mg | ORAL_TABLET | Freq: Once | ORAL | Status: AC
  Administered 2020-10-10: 1000 mg via ORAL
  Filled 2020-10-10: qty 2

## 2020-10-10 NOTE — ED Notes (Signed)
Called [facility] for transport back to [facility]

## 2020-10-10 NOTE — ED Triage Notes (Signed)
Pt via EMS from [facility]. Pt had a mechanical fall today when pt was trying to stand. When asked if she is in pain. Pt states L leg and R leg but unable to pinpoint the specific location. Pt has a hx of dementia, pt is alert but oriented to year.

## 2020-10-10 NOTE — ED Provider Notes (Signed)
[facility] Emergency Department Provider Note  ____________________________________________   Event Date/Time   First MD Initiated Contact with Patient 10/10/20 1439     (approximate)  I have reviewed the triage vital signs and the nursing notes.   HISTORY  Chief Complaint Fall   HPI Teresa Richard is a 80 y.o. female with a past medical history of anemia, dementia, depression, hypothyroidism and complete heart block status post placement of pacemaker who presents EMS from nursing facility after she had a witnessed ground-level fall.  Per staff without was able to reach she was standing when a found the patient on the floor.  Sent home with spoke to did not see this happen is not sure if she passed out or hit her head and does not import any additional details.  She has not otherwise been sick as far staff can tell without any recent vomiting cough diarrhea or other recent falls.  She is on any blood thinners.  Is not oriented and unable provide any history on arrival.          Past Medical History:  Diagnosis Date  . 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 Anxiety    due to memory loss  . Dementia (HCC)   . Depression   . Dysrhythmia   . Hypothyroidism   . 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 COVID-19 virus infection 02/16/2019  . B12 deficiency anemia 02/16/2019  . Syncope 02/09/2019  . Pancytopenia (HCC) 02/09/2019  . 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 Thyroid disease 01/23/2014  . 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 BREAST CYST EXCISION Left 1980's   negative  . CATARACT EXTRACTION W/PHACO Left 11/11/2016   Procedure: CATARACT EXTRACTION PHACO AND INTRAOCULAR LENS PLACEMENT (IOC);  Surgeon: Galen Manila, MD;  Location: [facility] ORS;  Service: Ophthalmology;  Laterality: Left;  [place]   00:55 AP% 14.7 CDE 8.15 Fluid pack lot # [id number]  . CTR Right   . EYE SURGERY Bilateral    cataract extractions  . FRACTURE SURGERY Right 2005   LEG. nuts and bolts in knee and foot  . IMPLANTABLE CARDIOVERTER DEFIBRILLATOR (ICD) GENERATOR CHANGE Left 07/07/2018   Procedure: PACER CHANGEOUT;  Surgeon: Marcina Millard, MD;  Location: [facility] ORS;  Service: Cardiovascular;  Laterality: Left;  . INSERT / REPLACE / REMOVE PACEMAKER    . PACEMAKER INSERTION    . RCR Left 2008    Prior to Admission medications   Medication Sig Start Date End Date Taking? Authorizing Provider  ALPRAZolam (XANAX) 0.25 MG tablet Take 1 tablet (0.25 mg total) by mouth daily as needed for anxiety. 02/17/19   Tyrone Nine, MD  aspirin EC 81 MG tablet Take 81 mg by mouth daily.     [provider]  busPIRone (BUSPAR) 5 MG tablet Take 5 mg by mouth 2 (two) times daily.    [provider]  Cholecalciferol (VITAMIN D3) 1000 units CAPS Take 1,000 Units by mouth daily.     [provider]  Cyanocobalamin (VITAMIN B-12 IJ) Inject 1 application as directed every 4 (four) months. Dr. Marcello Fennel oversees injection. Patient had last dose in 10/19    [provider]  levothyroxine (SYNTHROID, LEVOTHROID) 75 MCG tablet Take 75 mcg by mouth every morning.     [provider]  magnesium hydroxide (MILK OF MAGNESIA) 400 MG/5ML suspension Take 15 mLs by mouth daily as needed for mild constipation.    [provider]  magnesium oxide (MAG-OX) 400 MG tablet Take 400 mg by mouth daily.    [provider]  Melatonin 5 MG TABS Take 5 mg by mouth at bedtime.    [provider]  QUEtiapine (SEROQUEL) 50 MG tablet Take 50 mg by mouth 3 (three) times daily. 0800, 1400, 2000    [provider]    Allergies Hydrocodone and Oxycodone  Family History  Problem Relation Age of Onset  . Breast cancer Neg Hx     Social History Social History   Tobacco Use  .  Smoking status: Never Smoker  . Smokeless tobacco: Never Used  Vaping Use  . Vaping Use: Never used  Substance Use Topics  . Alcohol use: No  . Drug use: No    Review of Systems  Review of Systems  Unable to perform ROS: Dementia      ____________________________________________   PHYSICAL EXAM:  VITAL SIGNS: ED Triage Vitals  Enc Vitals Group     BP 10/10/20 1423 (!) 108/58     Pulse Rate 10/10/20 1423 63     Resp 10/10/20 1423 20     Temp 10/10/20 1423 98.1 F (36.7 C)     Temp Source 10/10/20 1423 Oral     SpO2 10/10/20 1423 100 %     Weight 10/10/20 1425 130 lb (59 kg)     Height 10/10/20 1425 5' (1.524 m)     Head Circumference --      Peak Flow --      Pain Score --      Pain Loc --      Pain Edu? --      Excl. in GC? --    Vitals:   10/10/20 1607 10/10/20 1718  BP: (!) 146/80 100/79  Pulse: 88 69  Resp: 20 20  Temp:    SpO2: 99% 100%   Physical Exam Vitals and nursing note reviewed.  Constitutional:      General: She is not in acute distress.    Appearance: She is well-developed.  HENT:     Head: Normocephalic and atraumatic.  Eyes:     Conjunctiva/sclera: Conjunctivae normal.  Cardiovascular:     Rate and Rhythm: Normal rate and regular rhythm.     Heart sounds: No murmur heard.   Pulmonary:     Effort: Pulmonary effort is normal. No respiratory distress.     Breath sounds: Normal breath sounds.  Abdominal:     Palpations: Abdomen is soft.     Tenderness: There is no abdominal tenderness.  Musculoskeletal:     Cervical back: Neck supple.  Skin:    General: Skin is warm and dry.  Neurological:     Mental Status: She is alert. Mental status is at baseline. She is disoriented and confused.     Cranial nerves II through XII grossly intact.  No tenderness step-offs or deformities over the C/C/L-spine.  Patient has full strength in her bilateral upper extremities and throughout the left lower EXTR.  She is slightly weaker in the right hip  and seems to be in pain when she is attempting to flex or extend it.  No obvious overlying skin changes or tenderness.  Remainder of exam the right left thumb is unremarkable.  Sensation intact light touch of all extremities.  No other obvious trauma to  the face scalp head neck chest abdomen or torso. ____________________________________________   LABS (all labs ordered are listed, but only abnormal results are displayed)  Labs Reviewed  CBC WITH DIFFERENTIAL/PLATELET - Abnormal; Notable for the following components:      Result Value   WBC 3.8 (*)    RBC 3.35 (*)    Hemoglobin 10.2 (*)    HCT 31.6 (*)    Platelets 142 (*)    All other components within normal limits  BASIC METABOLIC PANEL - Abnormal; Notable for the following components:   BUN 32 (*)    All other components within normal limits  TROPONIN I (HIGH SENSITIVITY)   ____________________________________________  EKG  Atrially sensed ventricular paced rhythm at 65. ____________________________________________  RADIOLOGY  ED MD interpretation: No evidence of acute injury or other acute process on CT head or C-spine.  Plain film of the patient's chest, pelvis and right knee showed no clear acute fracture dislocation.  Official radiology report(s): CT Head Wo Contrast  Result Date: 10/10/2020 CLINICAL DATA:  Mechanical fall.  Head trauma.  Dementia. EXAM: CT HEAD WITHOUT CONTRAST TECHNIQUE: Contiguous axial images were obtained from the base of the skull through the vertex without intravenous contrast. COMPARISON:  08/27/2020 FINDINGS: Brain: No evidence of acute infarction, hemorrhage, hydrocephalus, extra-axial collection or mass lesion/mass effect. Periventricular white matter hypodensity is a nonspecific finding, but most commonly relates to chronic ischemic small vessel disease. Vascular: No hyperdense vessel or unexpected calcification. Skull: Normal. Negative for fracture or focal lesion. Sinuses/Orbits: No acute  finding. Other: None. IMPRESSION: No acute intracranial abnormality. Electronically Signed   By: Acquanetta BellingFarhaan  Mir M.D.   On: 10/10/2020 15:53   CT Cervical Spine Wo Contrast  Result Date: 10/10/2020 CLINICAL DATA:  Pain following fall EXAM: CT CERVICAL SPINE WITHOUT CONTRAST TECHNIQUE: Multidetector CT imaging of the cervical spine was performed without intravenous contrast. Multiplanar CT image reconstructions were also generated. COMPARISON:  May 08, 2020 FINDINGS: Alignment: There is mild upper thoracic levoscoliosis. There is no appreciable spondylolisthesis. Skull base and vertebrae: Skull base and craniocervical junction regions appear normal. No fracture appreciable. No blastic or lytic bone lesions. Soft tissues and spinal canal: Prevertebral soft tissues and predental space regions are normal. There is no evident cord or canal hematoma. There are no paraspinous lesions. Disc levels: There is moderate disc space narrowing at C5-6 and C6-7. Other disc spaces appear unremarkable. There is facet hypertrophy at multiple levels. There is impression on the exiting nerve root on the left at C4-5 due to bony hypertrophy. No frank disc extrusion or stenosis. Upper chest: Upper lung regions are clear. Other: There is aortic atherosclerosis. There are foci subclavian and carotid artery calcification bilaterally. IMPRESSION: No evident fracture or spondylolisthesis. There is upper thoracic levoscoliosis. There is multilevel osteoarthritic change. No frank disc extrusion or stenosis. There are foci of aortic atherosclerosis as well as foci subclavian and carotid artery calcification bilaterally. Electronically Signed   By: Bretta BangWilliam  Woodruff III M.D.   On: 10/10/2020 15:57   DG Chest Portable 1 View  Result Date: 10/10/2020 CLINICAL DATA:  Pain following fall EXAM: PORTABLE CHEST 1 VIEW COMPARISON:  February 10, 2019 FINDINGS: Lungs are clear. Heart size and pulmonary vascularity are normal. Pacemaker leads are  attached to the right atrium and right ventricle, stable. No adenopathy. No pneumothorax. No bone lesions. IMPRESSION: Lungs clear. Stable cardiac silhouette. Stable pacemaker lead placements. Electronically Signed   By: Bretta BangWilliam  Woodruff III M.D.   On: 10/10/2020 15:58  DG Knee Complete 4 Views Right  Result Date: 10/10/2020 CLINICAL DATA:  Fall EXAM: RIGHT KNEE - COMPLETE 4+ VIEW COMPARISON:  Report 08/05/2019 FINDINGS: Surgical plate and multiple screw fixation of the proximal tibia with intact hardware and normal alignment. No acute fracture or dislocation. 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 Small knee effusion IMPRESSION: 1. Moderate severe arthritis of the knee with effusion. No acute osseous abnormality 2. Postsurgical changes of the proximal tibia Electronically Signed   By: Jasmine Pang M.D.   On: 10/10/2020 16:36   DG Hip Unilat W or Wo Pelvis 2-3 Views Right  Result Date: 10/10/2020 CLINICAL DATA:  Pain following fall EXAM: DG HIP (WITH OR WITHOUT PELVIS) 2-3V RIGHT COMPARISON:  October 31, 2016 FINDINGS: Frontal pelvis as well as frontal and lateral right hip images were obtained. Bones are osteoporotic. No appreciable fracture or dislocation. There is mild symmetric narrowing of each hip joint. There is lower lumbar levoscoliosis with degenerative change IMPRESSION: No fracture or dislocation. Osteoporosis. Symmetric narrowing of each hip joint, stable. Degenerative change in lower lumbar spine. Electronically Signed   By: Bretta Bang III M.D.   On: 10/10/2020 15:59    ____________________________________________   PROCEDURES  Procedure(s) performed (including Critical Care):  .1-3 Lead EKG Interpretation Performed by: Gilles Chiquito, MD Authorized by: Gilles Chiquito, MD     ECG rate assessment: normal     Rhythm: paced     Conduction: abnormal       ____________________________________________   INITIAL IMPRESSION /  ASSESSMENT AND PLAN / ED COURSE      Patient presents with above-stated history and exam after she had a ground-level fall earlier today at her nursing facility.  On arrival she is afebrile and hemodynamically stable.  She is unable provide any significant history secondary to dementia although appears to be at her neurological baseline.  Aside from some pain in her right hip when she is asked to flex and extend at there is no obvious findings of trauma on exam of the extremities elsewhere of the head neck face chest back or abdomen.  She is neurovascular intact in all extremities.  Given she is ambulating history of CT head and C-spine were obtained did not show any clear acute fracture or C-spine injury or intracranial hemorrhage.  In addition plain film of the chest and right hip show no clear acute fracture dislocation.  I suspect patient may have sustained a contusion to her right hip and right anterior knee.  No other obvious evidence of trauma on imaging or exam Evalose patient for occult significant visceral injury.  CBC shows hemoglobin at baseline and normal platelets.  BMP showed no significant electrode or metabolic derangements and troponin is nonelevated and not consistent with ACS.  Given stable vitals with eyes reassuring exam work-up patient at her neurological baseline I think she is safe for discharge back to nursing facility with plan for EP follow-up.  Discharged in stable condition.  Return precautions provided in writing.        ____________________________________________   FINAL CLINICAL IMPRESSION(S) / ED DIAGNOSES  Final diagnoses:  Fall, initial encounter  Dementia without behavioral disturbance, unspecified dementia type (HCC)    Medications  busPIRone (BUSPAR) tablet 5 mg (5 mg Oral Given 10/10/20 1551)  lactated ringers bolus 1,000 mL (0 mLs Intravenous Stopped 10/10/20 1718)  acetaminophen (TYLENOL) tablet 1,000 mg (1,000 mg Oral Given 10/10/20 1551)     ED  Discharge Orders  None       Note:  This document was prepared using Dragon voice recognition software and may include unintentional dictation errors.   Gilles Chiquito, MD 10/10/20 [phone]

## 2020-10-14 ENCOUNTER — Emergency Department: Payer: Medicare Other

## 2020-10-14 ENCOUNTER — Emergency Department
Admission: EM | Admit: 2020-10-14 | Discharge: 2020-10-14 | Disposition: A | Discharge status: Home / Self Care | Payer: Medicare Other | Attending: Emergency Medicine | Admitting: Emergency Medicine

## 2020-10-14 ENCOUNTER — Other Ambulatory Visit: Payer: Self-pay

## 2020-10-14 DIAGNOSIS — Z79899 Other long term (current) drug therapy: Secondary | ICD-10-CM | POA: Insufficient documentation

## 2020-10-14 DIAGNOSIS — Z7982 Long term (current) use of aspirin: Secondary | ICD-10-CM | POA: Insufficient documentation

## 2020-10-14 DIAGNOSIS — F0390 Unspecified dementia without behavioral disturbance: Secondary | ICD-10-CM | POA: Insufficient documentation

## 2020-10-14 DIAGNOSIS — S0003XA Contusion of scalp, initial encounter: Secondary | ICD-10-CM | POA: Insufficient documentation

## 2020-10-14 DIAGNOSIS — W01198A Fall on same level from slipping, tripping and stumbling with subsequent striking against other object, initial encounter: Secondary | ICD-10-CM | POA: Insufficient documentation

## 2020-10-14 DIAGNOSIS — T148XXA Other injury of unspecified body region, initial encounter: Secondary | ICD-10-CM

## 2020-10-14 DIAGNOSIS — Z8616 Personal history of COVID-19: Secondary | ICD-10-CM | POA: Insufficient documentation

## 2020-10-14 DIAGNOSIS — E039 Hypothyroidism, unspecified: Secondary | ICD-10-CM | POA: Insufficient documentation

## 2020-10-14 DIAGNOSIS — Z950 Presence of cardiac pacemaker: Secondary | ICD-10-CM | POA: Insufficient documentation

## 2020-10-14 DIAGNOSIS — Y92121 Bathroom in nursing home as the place of occurrence of the external cause: Secondary | ICD-10-CM | POA: Insufficient documentation

## 2020-10-14 DIAGNOSIS — S0990XA Unspecified injury of head, initial encounter: Secondary | ICD-10-CM | POA: Diagnosis present

## 2020-10-14 DIAGNOSIS — W19XXXA Unspecified fall, initial encounter: Secondary | ICD-10-CM

## 2020-10-14 MED ORDER — QUETIAPINE FUMARATE 25 MG PO TABS
50.0000 mg | ORAL_TABLET | Freq: Three times a day (TID) | ORAL | Status: DC
  Administered 2020-10-14: 50 mg via ORAL
  Filled 2020-10-14: qty 2

## 2020-10-14 MED ORDER — BUSPIRONE HCL 5 MG PO TABS
5.0000 mg | ORAL_TABLET | Freq: Two times a day (BID) | ORAL | Status: DC
  Administered 2020-10-14: 5 mg via ORAL
  Filled 2020-10-14: qty 1

## 2020-10-14 NOTE — Discharge Instructions (Addendum)
Return to the ER for worsening symptoms, persistent vomiting, lethargy or other concerns. °

## 2020-10-14 NOTE — ED Triage Notes (Addendum)
Pt arrives via [place] em[facility] from SNF [facility]) s/p unwitnessed fall while oob in the bathroom -- staff overheard pt calling for help-- per staff no LOC however pt did hit the back of her head against toilet roll holder causing it to detach from wall -- bleeding to posterior scalp controlled upon arrival to ED (ems reports pt is not on blood thinners).  (note- pt had a separate mech fall at facilith 4 days pta)

## 2020-10-14 NOTE — ED Provider Notes (Signed)
[facility] Emergency Department Provider Note   ____________________________________________   Event Date/Time   First MD Initiated Contact with Patient 10/14/20 [phone]     (approximate)  I have reviewed the triage vital signs and the nursing notes.   HISTORY  Chief Complaint Fall  Level of V caveat: Limited by dementia  HPI Teresa Richard is a 80 y.o. female brought to the ED via EMS from SNF status post unwitnessed fall.  Staff heard patient calling for help.  Reports mechanical fall striking the back of her head against toilet paper roll holder causing it to detach from the wall.  Presents with bleeding to posterior scalp.  Patient not on anticoagulants.  Voices no complaints.  Denies headache, neck pain, vision changes, chest pain, shortness of breath, abdominal pain, nausea, vomiting or dizziness.     Past Medical History:  Diagnosis Date  . 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 Anxiety    due to memory loss  . Dementia (HCC)   . Depression   . Dysrhythmia   . Hypothyroidism   . 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 COVID-19 virus infection 02/16/2019  . B12 deficiency anemia 02/16/2019  . Syncope 02/09/2019  . Pancytopenia (HCC) 02/09/2019  . 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 Thyroid disease 01/23/2014  . 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 BREAST CYST EXCISION Left 1980's   negative  . CATARACT EXTRACTION W/PHACO Left 11/11/2016   Procedure: CATARACT EXTRACTION PHACO AND INTRAOCULAR LENS PLACEMENT (IOC);  Surgeon: Galen Manila, MD;  Location: [facility] ORS;  Service: Ophthalmology;  Laterality: Left;  [place]  00:55 AP% 14.7 CDE 8.15 Fluid pack lot # [id number]  . CTR Right   . EYE SURGERY Bilateral    cataract extractions  . FRACTURE SURGERY Right 2005   LEG. nuts and bolts in knee and  foot  . IMPLANTABLE CARDIOVERTER DEFIBRILLATOR (ICD) GENERATOR CHANGE Left 07/07/2018   Procedure: PACER CHANGEOUT;  Surgeon: Marcina Millard, MD;  Location: [facility] ORS;  Service: Cardiovascular;  Laterality: Left;  . INSERT / REPLACE / REMOVE PACEMAKER    . PACEMAKER INSERTION    . RCR Left 2008    Prior to Admission medications   Medication Sig Start Date End Date Taking? Authorizing Provider  ALPRAZolam (XANAX) 0.25 MG tablet Take 1 tablet (0.25 mg total) by mouth daily as needed for anxiety. 02/17/19   Tyrone Nine, MD  aspirin EC 81 MG tablet Take 81 mg by mouth daily.     [provider]  busPIRone (BUSPAR) 5 MG tablet Take 5 mg by mouth 2 (two) times daily.    [provider]  Cholecalciferol (VITAMIN D3) 1000 units CAPS Take 1,000 Units by mouth daily.     [provider]  Cyanocobalamin (VITAMIN B-12 IJ) Inject 1 application as directed every 4 (four) months. Dr. Marcello Fennel oversees injection. Patient had last dose in 10/19    [provider]  levothyroxine (SYNTHROID, LEVOTHROID) 75 MCG tablet Take 75 mcg by mouth every morning.     [provider]  magnesium hydroxide (MILK OF MAGNESIA) 400 MG/5ML suspension Take 15 mLs by mouth daily as needed for mild constipation.    [provider]  magnesium oxide (MAG-OX) 400 MG tablet Take 400 mg by mouth daily.  [provider]  Melatonin 5 MG TABS Take 5 mg by mouth at bedtime.    [provider]  QUEtiapine (SEROQUEL) 50 MG tablet Take 50 mg by mouth 3 (three) times daily. 0800, 1400, 2000    [provider]    Allergies Hydrocodone and Oxycodone  Family History  Problem Relation Age of Onset  . Breast cancer Neg Hx     Social History Social History   Tobacco Use  . Smoking status: Never Smoker  . Smokeless tobacco: Never Used  Vaping Use  . Vaping Use: Never used  Substance Use Topics  . Alcohol use: No  . Drug use: No    Review of  Systems  Constitutional: No fever/chills Eyes: No visual changes. ENT: No sore throat. Cardiovascular: Denies chest pain. Respiratory: Denies shortness of breath. Gastrointestinal: No abdominal pain.  No nausea, no vomiting.  No diarrhea.  No constipation. Genitourinary: Negative for dysuria. Musculoskeletal: Negative for back pain. Skin: Negative for rash. Neurological: Positive for head injury.  Negative for headaches, focal weakness or numbness.   ____________________________________________   PHYSICAL EXAM:  VITAL SIGNS: ED Triage Vitals [10/14/20 0414]  Enc Vitals Group     BP (!) 142/72     Pulse Rate 63     Resp 16     Temp 98.2 F (36.8 C)     Temp Source Oral     SpO2 100 %     Weight      Height      Head Circumference      Peak Flow      Pain Score 0     Pain Loc      Pain Edu?      Excl. in GC?     Constitutional: Alert and oriented.  Elderly appearing and in no acute distress. Eyes: Conjunctivae are normal. PERRL. EOMI. Head: Atraumatic. Nose: Atraumatic. Mouth/Throat: Mucous membranes are moist.  No dental malocclusion. Neck: No stridor.  No cervical spine tenderness to palpation.  No step-offs or deformities noted. Cardiovascular: Normal rate, regular rhythm. Grossly normal heart sounds.  Good peripheral circulation. Respiratory: Normal respiratory effort.  No retractions. Lungs CTAB. Gastrointestinal: Soft and nontender to light or deep palpation. No distention. No abdominal bruits. No CVA tenderness. Musculoskeletal: No spinal tenderness to palpation.  Pelvis is stable.  No lower extremity tenderness nor edema.  No joint effusions. Neurologic: Alert and oriented to person.  Normal speech and language. No gross focal neurologic deficits are appreciated.  Skin:  Skin is warm, dry and intact. No rash noted. Psychiatric: Mood and affect are normal. Speech and behavior are normal.  ____________________________________________   LABS (all labs  ordered are listed, but only abnormal results are displayed)  Labs Reviewed - No data to display ____________________________________________  EKG  ED ECG REPORT I, Moni Rothrock J, the attending physician, personally viewed and interpreted this ECG.   Date: 10/14/2020  EKG Time: 0356  Rate: 61  Rhythm: normal EKG, normal sinus rhythm  Axis: LAD  Intervals:left bundle branch block  ST&T Change: Nonspecific  ____________________________________________  RADIOLOGY I, Yousra Ivens J, personally viewed and evaluated these images (plain radiographs) as part of my medical decision making, as well as reviewing the written report by the radiologist.  ED MD interpretation: No ICH  Official radiology report(s): CT Head Wo Contrast  Result Date: 10/14/2020 CLINICAL DATA:  Minor head trauma EXAM: CT HEAD WITHOUT CONTRAST TECHNIQUE: Contiguous axial images were obtained from the base of the skull through the  vertex without intravenous contrast. COMPARISON:  10/10/2020 FINDINGS: Brain: No evidence of acute infarction, hemorrhage, hydrocephalus, extra-axial collection or mass lesion/mass effect. Vascular: No hyperdense vessel or unexpected calcification. Skull: Normal. Negative for fracture or focal lesion. Sinuses/Orbits: No acute finding. IMPRESSION: No evidence of intracranial injury. Electronically Signed   By: Marnee Spring M.D.   On: 10/14/2020 04:27    ____________________________________________   PROCEDURES  Procedure(s) performed (including Critical Care):  Procedures   ____________________________________________   INITIAL IMPRESSION / ASSESSMENT AND PLAN / ED COURSE  As part of my medical decision making, I reviewed the following data within the electronic MEDICAL RECORD NUMBER History obtained from family, Nursing notes reviewed and incorporated, Old chart reviewed, Radiograph reviewed and Notes from prior ED visits     80 year old female presenting status post fall with head  injury.  Differential diagnosis includes but is not limited to ICH, SDH, contusion, concussion, etc.  Will obtain CT head and reassess.  Clinical Course as of 10/14/20 0602  Sun Oct 14, 2020  0102 Posterior scalp cleansed to reveal minor abrasion not requiring sutures or staples.  Daughter at bedside and updated.  States patient is at her baseline.  Daughter is not concerned for recurrent fall as patient had another fall recently but daughter stated that was secondary to dehydration and patient returned back to baseline after receiving IV fluids.  Strict return precautions given.  Patient and daughter verbalized understanding agree with plan of care. [JS]    Clinical Course User Index [JS] Irean Hong, MD     ____________________________________________   FINAL CLINICAL IMPRESSION(S) / ED DIAGNOSES  Final diagnoses:  Fall, initial encounter  Contusion of scalp, initial encounter  Abrasion     ED Discharge Orders    None      *Please note:  Teresa Richard was evaluated in Emergency Department on 10/14/2020 for the symptoms described in the history of present illness. She was evaluated in the context of the global COVID-19 pandemic, which necessitated consideration that the patient might be at risk for infection with the SARS-CoV-2 virus that causes COVID-19. Institutional protocols and algorithms that pertain to the evaluation of patients at risk for COVID-19 are in a state of rapid change based on information released by regulatory bodies including the CDC and federal and state organizations. These policies and algorithms were followed during the patient's care in the ED.  Some ED evaluations and interventions may be delayed as a result of limited staffing during and the pandemic.*   Note:  This document was prepared using Dragon voice recognition software and may include unintentional dictation errors.   Irean Hong, MD 10/14/20 [phone]

## 2020-10-14 NOTE — ED Notes (Signed)
Pt now returned from CT dept via stretcher  

## 2020-10-14 NOTE — ED Notes (Signed)
Posterior scalp abrasion noted after wound care-- dried blood cleansed wit half hydrogen peroxide and half sterile water-- provider Dr Wynelle Link to bedside to eval site after wound care-- no sutures required; well tolerated by patient -- bleeding remains controlled-- pt daughter remains at bedside as pt awaits transport back to facility

## 2020-10-14 NOTE — ED Notes (Signed)
Patient transported to CT (daughter now at bedside- was called by facility)

## 2020-10-26 ENCOUNTER — Inpatient Hospital Stay (HOSPITAL_COMMUNITY)
Admission: EM | Admit: 2020-10-26 | Discharge: 2020-11-06 | DRG: 521 | Disposition: A | Discharge status: Skilled Nursing Facility | Payer: Medicare Other | Source: Skilled Nursing Facility | Attending: Internal Medicine | Admitting: Internal Medicine

## 2020-10-26 ENCOUNTER — Encounter (HOSPITAL_COMMUNITY): Payer: Self-pay | Admitting: Internal Medicine

## 2020-10-26 ENCOUNTER — Emergency Department (HOSPITAL_COMMUNITY): Payer: Medicare Other

## 2020-10-26 ENCOUNTER — Other Ambulatory Visit: Payer: Self-pay

## 2020-10-26 DIAGNOSIS — D62 Acute posthemorrhagic anemia: Secondary | ICD-10-CM | POA: Diagnosis not present

## 2020-10-26 DIAGNOSIS — E079 Disorder of thyroid, unspecified: Secondary | ICD-10-CM | POA: Diagnosis present

## 2020-10-26 DIAGNOSIS — Z7982 Long term (current) use of aspirin: Secondary | ICD-10-CM | POA: Diagnosis not present

## 2020-10-26 DIAGNOSIS — Z9841 Cataract extraction status, right eye: Secondary | ICD-10-CM

## 2020-10-26 DIAGNOSIS — S72009A Fracture of unspecified part of neck of unspecified femur, initial encounter for closed fracture: Secondary | ICD-10-CM | POA: Diagnosis present

## 2020-10-26 DIAGNOSIS — Z20822 Contact with and (suspected) exposure to covid-19: Secondary | ICD-10-CM | POA: Diagnosis present

## 2020-10-26 DIAGNOSIS — Z79890 Hormone replacement therapy (postmenopausal): Secondary | ICD-10-CM

## 2020-10-26 DIAGNOSIS — E43 Unspecified severe protein-calorie malnutrition: Secondary | ICD-10-CM | POA: Diagnosis present

## 2020-10-26 DIAGNOSIS — Z419 Encounter for procedure for purposes other than remedying health state, unspecified: Secondary | ICD-10-CM

## 2020-10-26 DIAGNOSIS — W19XXXA Unspecified fall, initial encounter: Secondary | ICD-10-CM

## 2020-10-26 DIAGNOSIS — I442 Atrioventricular block, complete: Secondary | ICD-10-CM | POA: Diagnosis present

## 2020-10-26 DIAGNOSIS — E875 Hyperkalemia: Secondary | ICD-10-CM | POA: Diagnosis not present

## 2020-10-26 DIAGNOSIS — D61818 Other pancytopenia: Secondary | ICD-10-CM | POA: Diagnosis present

## 2020-10-26 DIAGNOSIS — S72001A Fracture of unspecified part of neck of right femur, initial encounter for closed fracture: Secondary | ICD-10-CM | POA: Diagnosis present

## 2020-10-26 DIAGNOSIS — Z6825 Body mass index (BMI) 25.0-25.9, adult: Secondary | ICD-10-CM

## 2020-10-26 DIAGNOSIS — F419 Anxiety disorder, unspecified: Secondary | ICD-10-CM | POA: Diagnosis present

## 2020-10-26 DIAGNOSIS — N179 Acute kidney failure, unspecified: Secondary | ICD-10-CM | POA: Diagnosis not present

## 2020-10-26 DIAGNOSIS — E039 Hypothyroidism, unspecified: Secondary | ICD-10-CM | POA: Diagnosis present

## 2020-10-26 DIAGNOSIS — W1830XA Fall on same level, unspecified, initial encounter: Secondary | ICD-10-CM | POA: Diagnosis present

## 2020-10-26 DIAGNOSIS — I493 Ventricular premature depolarization: Secondary | ICD-10-CM | POA: Diagnosis present

## 2020-10-26 DIAGNOSIS — Z95810 Presence of automatic (implantable) cardiac defibrillator: Secondary | ICD-10-CM

## 2020-10-26 DIAGNOSIS — Z66 Do not resuscitate: Secondary | ICD-10-CM | POA: Diagnosis present

## 2020-10-26 DIAGNOSIS — S72011A Unspecified intracapsular fracture of right femur, initial encounter for closed fracture: Principal | ICD-10-CM | POA: Diagnosis present

## 2020-10-26 DIAGNOSIS — Z950 Presence of cardiac pacemaker: Secondary | ICD-10-CM

## 2020-10-26 DIAGNOSIS — Z961 Presence of intraocular lens: Secondary | ICD-10-CM | POA: Diagnosis present

## 2020-10-26 DIAGNOSIS — Z79899 Other long term (current) drug therapy: Secondary | ICD-10-CM | POA: Diagnosis not present

## 2020-10-26 DIAGNOSIS — Z8616 Personal history of COVID-19: Secondary | ICD-10-CM | POA: Diagnosis not present

## 2020-10-26 DIAGNOSIS — Z9842 Cataract extraction status, left eye: Secondary | ICD-10-CM

## 2020-10-26 DIAGNOSIS — E538 Deficiency of other specified B group vitamins: Secondary | ICD-10-CM | POA: Diagnosis present

## 2020-10-26 DIAGNOSIS — E7800 Pure hypercholesterolemia, unspecified: Secondary | ICD-10-CM | POA: Diagnosis present

## 2020-10-26 DIAGNOSIS — F32A Depression, unspecified: Secondary | ICD-10-CM | POA: Diagnosis present

## 2020-10-26 DIAGNOSIS — D519 Vitamin B12 deficiency anemia, unspecified: Secondary | ICD-10-CM | POA: Diagnosis present

## 2020-10-26 DIAGNOSIS — I10 Essential (primary) hypertension: Secondary | ICD-10-CM | POA: Diagnosis present

## 2020-10-26 DIAGNOSIS — R52 Pain, unspecified: Secondary | ICD-10-CM

## 2020-10-26 DIAGNOSIS — F0390 Unspecified dementia without behavioral disturbance: Secondary | ICD-10-CM | POA: Diagnosis present

## 2020-10-26 DIAGNOSIS — Z885 Allergy status to narcotic agent status: Secondary | ICD-10-CM

## 2020-10-26 LAB — BASIC METABOLIC PANEL
Anion gap: 9 (ref 5–15)
BUN: 31 mg/dL — ABNORMAL HIGH (ref 8–23)
CO2: 32 mmol/L (ref 22–32)
Calcium: 9.2 mg/dL (ref 8.9–10.3)
Chloride: 102 mmol/L (ref 98–111)
Creatinine, Ser: 0.75 mg/dL (ref 0.44–1.00)
GFR, Estimated: >60 mL/min (ref >60)
Glucose, Bld: 117 mg/dL — ABNORMAL HIGH (ref 70–99)
Potassium: 4.4 mmol/L (ref 3.5–5.1)
Sodium: 143 mmol/L (ref 135–145)

## 2020-10-26 LAB — RESP PANEL BY RT-PCR (FLU A&B, COVID) ARPGX2
Influenza A by PCR: NEGATIVE
Influenza B by PCR: NEGATIVE
SARS Coronavirus 2 by RT PCR: NEGATIVE

## 2020-10-26 LAB — CBC WITH DIFFERENTIAL/PLATELET
Abs Immature Granulocytes: 0.03 10*3/uL (ref 0.00–0.07)
Basophils Absolute: 0 10*3/uL (ref 0.0–0.1)
Basophils Relative: 1 %
Eosinophils Absolute: 0.2 10*3/uL (ref 0.0–0.5)
Eosinophils Relative: 3 %
HCT: 31.5 % — ABNORMAL LOW (ref 36.0–46.0)
Hemoglobin: 9.8 g/dL — ABNORMAL LOW (ref 12.0–15.0)
Immature Granulocytes: 1 %
Lymphocytes Relative: 19 %
Lymphs Abs: 1 10*3/uL (ref 0.7–4.0)
MCH: 30.3 pg (ref 26.0–34.0)
MCHC: 31.1 g/dL (ref 30.0–36.0)
MCV: 97.5 fL (ref 80.0–100.0)
Monocytes Absolute: 0.6 10*3/uL (ref 0.1–1.0)
Monocytes Relative: 11 %
Neutro Abs: 3.6 10*3/uL (ref 1.7–7.7)
Neutrophils Relative %: 65 %
Platelets: 328 10*3/uL (ref 150–400)
RBC: 3.23 MIL/uL — ABNORMAL LOW (ref 3.87–5.11)
RDW: 13.7 % (ref 11.5–15.5)
WBC: 5.5 10*3/uL (ref 4.0–10.5)
nRBC: 0 % (ref 0.0–0.2)

## 2020-10-26 LAB — PROTIME-INR
INR: 1 (ref 0.8–1.2)
Prothrombin Time: 13.4 seconds (ref 11.4–15.2)

## 2020-10-26 LAB — TYPE AND SCREEN
ABO/RH(D): O POS
Antibody Screen: NEGATIVE

## 2020-10-26 MED ORDER — MORPHINE SULFATE (PF) 2 MG/ML IV SOLN
0.5000 mg | INTRAVENOUS | Status: DC | PRN
  Administered 2020-10-27: 0.5 mg via INTRAVENOUS
  Filled 2020-10-26: qty 1

## 2020-10-26 MED ORDER — SODIUM CHLORIDE 0.9 % IV SOLN: INTRAVENOUS | Status: AC

## 2020-10-26 MED ORDER — SODIUM CHLORIDE 0.9 % IV SOLN: INTRAVENOUS | Status: DC

## 2020-10-26 MED ORDER — FENTANYL CITRATE (PF) 100 MCG/2ML IJ SOLN
50.0000 ug | Freq: Once | INTRAMUSCULAR | Status: AC
  Administered 2020-10-26: 50 ug via INTRAVENOUS
  Filled 2020-10-26: qty 2

## 2020-10-26 NOTE — Consult Note (Signed)
ORTHOPAEDIC Consultation  REQUESTING PHYSICIAN: Eduard Clos, MD  PCP:  Barbette Reichmann, MD  Chief Complaint: Right hip pain  HPI: Teresa Richard is a 80 y.o. female who complains of right hip pain.  She has pretty significant dementia and lives at a memory care unit.  She was noted by her care providers there to have decreased mobility starting about 2 or 3 days ago.  She has been spending more time in the wheelchair.  She has been known in the past couple weeks have multiple unwitnessed falls.  She has been to the emergency department 3 times in the last 3 weeks.  She is accompanied by her daughter who does provide the history given her dementia at baseline.  Denies any medical comorbidities that would require anticoagulation.  She does have hypercholesterolemia as well as hypertension in the above-stated dementia.  No history of hip surgery in the past.  Past Medical History:  Diagnosis Date  . 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 Anxiety    due to memory loss  . Dementia (HCC)   . Depression   . Dysrhythmia   . Hypothyroidism   . 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 BREAST CYST EXCISION Left 1980's   negative  . CATARACT EXTRACTION W/PHACO Left 11/11/2016   Procedure: CATARACT EXTRACTION PHACO AND INTRAOCULAR LENS PLACEMENT (IOC);  Surgeon: Galen Manila, MD;  Location: [facility] ORS;  Service: Ophthalmology;  Laterality: Left;  [place]  00:55 AP% 14.7 CDE 8.15 Fluid pack lot # [id number]  . CTR Right   . EYE SURGERY Bilateral    cataract extractions  . FRACTURE SURGERY Right 2005   LEG. nuts and bolts in knee and foot  . IMPLANTABLE CARDIOVERTER DEFIBRILLATOR (ICD) GENERATOR CHANGE Left 07/07/2018   Procedure: PACER CHANGEOUT;  Surgeon: Marcina Millard, MD;  Location: [facility] ORS;  Service: Cardiovascular;  Laterality: Left;  . INSERT / REPLACE / REMOVE PACEMAKER    . PACEMAKER  INSERTION    . RCR Left 2008   Social History   Socioeconomic History  . Marital status: Widowed    Spouse name: Not on file  . Number of children: Not on file  . Years of education: Not on file  . Highest education level: Not on file  Occupational History  . 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 Smoking status: Never Smoker  . Smokeless tobacco: Never Used  Vaping Use  . Vaping Use: Never used  Substance and Sexual Activity  . Alcohol use: No  . Drug use: No  . Sexual activity: Not Currently  Other Topics Concern  . Not on file  Social History Narrative  . Not on file   Social Determinants of Health   Financial Resource Strain: Not on file  Food Insecurity: Not on file  Transportation Needs: Not on file  Physical Activity: Not on file  Stress: Not on file  Social Connections: Not on file   Family History  Problem Relation Age of Onset  . Breast cancer Neg Hx    Allergies  Allergen Reactions  . Hydrocodone Other (See Comments)    Feels like she is floating  . Oxycodone Other (See Comments)    Feels like she is floating   Prior to Admission medications   Medication Sig Start Date End Date Taking? Authorizing Provider  ALPRAZolam Prudy Feeler) 0.25  MG tablet Take 1 tablet (0.25 mg total) by mouth daily as needed for anxiety. 02/17/19   Tyrone Nine, MD  aspirin EC 81 MG tablet Take 81 mg by mouth daily.     [provider]  busPIRone (BUSPAR) 5 MG tablet Take 5 mg by mouth 2 (two) times daily.    [provider]  Cholecalciferol (VITAMIN D3) 1000 units CAPS Take 1,000 Units by mouth daily.     [provider]  Cyanocobalamin (VITAMIN B-12 IJ) Inject 1 application as directed every 4 (four) months. Dr. Marcello Fennel oversees injection. Patient had last dose in 10/19    [provider]  levothyroxine (SYNTHROID, LEVOTHROID) 75 MCG tablet Take 75 mcg by mouth every morning.     [provider]  magnesium  hydroxide (MILK OF MAGNESIA) 400 MG/5ML suspension Take 15 mLs by mouth daily as needed for mild constipation.    [provider]  magnesium oxide (MAG-OX) 400 MG tablet Take 400 mg by mouth daily.    [provider]  Melatonin 5 MG TABS Take 5 mg by mouth at bedtime.    [provider]  QUEtiapine (SEROQUEL) 50 MG tablet Take 50 mg by mouth 3 (three) times daily. 0800, 1400, 2000    [provider]   DG Knee 1-2 Views Right  Result Date: 10/26/2020 CLINICAL DATA:  Unwitnessed fall. EXAM: RIGHT KNEE - 1-2 VIEW COMPARISON:  None. FINDINGS: Bones diffusely under mineralized. Prior lateral plate and multi screw fixation of the proximal tibia. No evidence of acute fracture or periprosthetic lucency. Moderate tricompartmental osteoarthritis. No significant knee joint effusion. IMPRESSION: 1. No acute fracture or dislocation of the right knee. 2. Prior proximal tibial ORIF without complication. 3. Moderate tricompartmental osteoarthritis. Electronically Signed   By: Narda Rutherford M.D.   On: 10/26/2020 20:34   DG Hip Unilat W or Wo Pelvis 2-3 Views Right  Result Date: 10/26/2020 CLINICAL DATA:  Post fall with recent hip fracture on outpatient imaging. EXAM: DG HIP (WITH OR WITHOUT PELVIS) 2-3V RIGHT COMPARISON:  Hip radiograph 10/10/2020 FINDINGS: Right femoral neck fracture which is displaced. There is proximal migration of the femoral shaft. The femoral head is well seated in the acetabulum. Pubic rami are intact. Bones are diffusely under mineralized. Pubic symphysis and sacroiliac joints are congruent. IMPRESSION: Displaced right femoral neck fracture. Electronically Signed   By: Narda Rutherford M.D.   On: 10/26/2020 20:31    Positive ROS: All other systems have been reviewed and were otherwise negative with the exception of those mentioned in the HPI and as above.  Physical Exam: General: Alert, no acute distress Cardiovascular: No pedal edema Respiratory: No  cyanosis, no use of accessory musculature GI: No organomegaly, abdomen is soft and non-tender Skin: No lesions in the area of chief complaint Neurologic: Sensation intact distally Psychiatric: Patient is competent for consent with normal mood and affect Lymphatic: No axillary or cervical lymphadenopathy  MUSCULOSKELETAL:  Right lower extremity is shortened and externally rotated.  Otherwise 2+ dorsalis pedis pulse.  Spontaneously moves.  Unable to comply with focal exam due to dementia.  Assessment: Right femoral neck fracture  Plan: -Discussed with the patient's daughter at the bedside that this is a injury that would require operative management.  At this time, it would appear that she may be able to be posted for surgery on Monday.  She will need an arthroplasty of the right hip and this is something that I would defer to my arthroplasty partners  for.  Will discuss further with Dr. Charlann Boxer who can potentially do the surgery on Monday.  We will update the team as soon as we have definitive timing for that.  In the interim she can have a diet at this time and will be on bedrest until surgery.  She can have chemical DVT prophylaxis on Saturday and Sunday.    Teresa Kida, MD Cell [phone]    10/26/2020 11:10 PM

## 2020-10-26 NOTE — ED Triage Notes (Signed)
Pt bib [facility] from [facility] nursing facility. Per EMS, pt had an unwitnessed fall on the 15th this month. Ems was called bc the facility did an X-ray on the pt today and found R- femur fracture.  Hx dementia.   bp 132/76 p 87 rr 18  spo2 99% RA

## 2020-10-26 NOTE — ED Notes (Signed)
Patient transported to X-ray 

## 2020-10-26 NOTE — ED Provider Notes (Signed)
[facility] EMERGENCY DEPARTMENT Provider Note   CSN: [id number] Arrival date & time: 10/26/20  1815     History Chief Complaint  Patient presents with  . Fall    Teresa Richard is a 80 y.o. female.   Fall This is a recurrent problem. The current episode started more than 2 days ago. The problem has not changed since onset.The symptoms are aggravated by standing. The symptoms are relieved by rest. She has tried nothing for the symptoms. The treatment provided no relief.       Past Medical History:  Diagnosis Date  . 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 Anxiety    due to memory loss  . Dementia (HCC)   . Depression   . Dysrhythmia   . Hypothyroidism   . 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 COVID-19 virus infection 02/16/2019  . B12 deficiency anemia 02/16/2019  . Syncope 02/09/2019  . Pancytopenia (HCC) 02/09/2019  . 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 Thyroid disease 01/23/2014  . 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 BREAST CYST EXCISION Left 1980's   negative  . CATARACT EXTRACTION W/PHACO Left 11/11/2016   Procedure: CATARACT EXTRACTION PHACO AND INTRAOCULAR LENS PLACEMENT (IOC);  Surgeon: Galen Manila, MD;  Location: [facility] ORS;  Service: Ophthalmology;  Laterality: Left;  [place]  00:55 AP% 14.7 CDE 8.15 Fluid pack lot # [id number]  . CTR Right   . EYE SURGERY Bilateral    cataract extractions  . FRACTURE SURGERY Right 2005   LEG. nuts and bolts in knee and foot  . IMPLANTABLE CARDIOVERTER DEFIBRILLATOR (ICD) GENERATOR CHANGE Left 07/07/2018   Procedure: PACER CHANGEOUT;  Surgeon: Marcina Millard, MD;  Location: [facility] ORS;  Service: Cardiovascular;  Laterality: Left;  . INSERT / REPLACE / REMOVE PACEMAKER    . PACEMAKER INSERTION    . RCR Left 2008     OB History   No obstetric  history on file.     Family History  Problem Relation Age of Onset  . Breast cancer Neg Hx     Social History   Tobacco Use  . Smoking status: Never Smoker  . Smokeless tobacco: Never Used  Vaping Use  . Vaping Use: Never used  Substance Use Topics  . Alcohol use: No  . Drug use: No    Home Medications Prior to Admission medications   Medication Sig Start Date End Date Taking? Authorizing Provider  ALPRAZolam (XANAX) 0.25 MG tablet Take 1 tablet (0.25 mg total) by mouth daily as needed for anxiety. 02/17/19   Tyrone Nine, MD  aspirin EC 81 MG tablet Take 81 mg by mouth daily.     [provider]  busPIRone (BUSPAR) 5 MG tablet Take 5 mg by mouth 2 (two) times daily.    [provider]  Cholecalciferol (VITAMIN D3) 1000 units CAPS Take 1,000 Units by mouth daily.     [provider]  Cyanocobalamin (VITAMIN B-12 IJ) Inject 1 application as directed every 4 (four) months. Dr. Marcello Fennel oversees injection. Patient had last dose in 10/19    [provider]  levothyroxine (SYNTHROID, LEVOTHROID) 75 MCG tablet Take 75 mcg by mouth every morning.     [provider]  magnesium hydroxide (MILK OF MAGNESIA) 400 MG/5ML suspension Take 15 mLs by  mouth daily as needed for mild constipation.    [provider]  magnesium oxide (MAG-OX) 400 MG tablet Take 400 mg by mouth daily.    [provider]  Melatonin 5 MG TABS Take 5 mg by mouth at bedtime.    [provider]  QUEtiapine (SEROQUEL) 50 MG tablet Take 50 mg by mouth 3 (three) times daily. 0800, 1400, 2000    [provider]    Allergies    Hydrocodone and Oxycodone  Review of Systems   Review of Systems  Unable to perform ROS: Dementia    Physical Exam Updated Vital Signs BP (!) 103/53   Pulse 79   Temp 98.9 F (37.2 C) (Oral)   Resp 14   SpO2 92%   Physical Exam Vitals and nursing note reviewed. Exam conducted with a chaperone present.   Constitutional:      General: She is not in acute distress.    Appearance: Normal appearance.  HENT:     Head: Normocephalic and atraumatic.     Nose: No rhinorrhea.  Eyes:     General:        Right eye: No discharge.        Left eye: No discharge.     Conjunctiva/sclera: Conjunctivae normal.  Cardiovascular:     Rate and Rhythm: Normal rate and regular rhythm.  Pulmonary:     Effort: Pulmonary effort is normal. No respiratory distress.     Breath sounds: No stridor. No wheezing.  Chest:     Chest wall: No tenderness.  Abdominal:     General: Abdomen is flat. There is no distension.     Palpations: Abdomen is soft.     Tenderness: There is no abdominal tenderness. There is no guarding.  Musculoskeletal:        General: Tenderness present. No signs of injury.     Comments: Right lower extremity tenderness, decreased range of motion due to pain.  Neurovascular intact distal.  Skin:    General: Skin is warm and dry.  Neurological:     General: No focal deficit present.     Mental Status: She is alert. Mental status is at baseline.     Motor: No weakness.  Psychiatric:        Mood and Affect: Mood normal.        Behavior: Behavior normal.     ED Results / Procedures / Treatments   Labs (all labs ordered are listed, but only abnormal results are displayed) Labs Reviewed  BASIC METABOLIC PANEL - Abnormal; Notable for the following components:      Result Value   Glucose, Bld 117 (*)    BUN 31 (*)    All other components within normal limits  CBC WITH DIFFERENTIAL/PLATELET - Abnormal; Notable for the following components:   RBC 3.23 (*)    Hemoglobin 9.8 (*)    HCT 31.5 (*)    All other components within normal limits  RESP PANEL BY RT-PCR (FLU A&B, COVID) ARPGX2  PROTIME-INR  TYPE AND SCREEN    EKG None  Radiology DG Knee 1-2 Views Right  Result Date: 10/26/2020 CLINICAL DATA:  Unwitnessed fall. EXAM: RIGHT KNEE - 1-2 VIEW COMPARISON:  None. FINDINGS: Bones  diffusely under mineralized. Prior lateral plate and multi screw fixation of the proximal tibia. No evidence of acute fracture or periprosthetic lucency. Moderate tricompartmental osteoarthritis. No significant knee joint effusion. IMPRESSION: 1. No acute fracture or dislocation of the right knee. 2. Prior proximal  tibial ORIF without complication. 3. Moderate tricompartmental osteoarthritis. Electronically Signed   By: Narda Rutherford M.D.   On: 10/26/2020 20:34   DG Hip Unilat W or Wo Pelvis 2-3 Views Right  Result Date: 10/26/2020 CLINICAL DATA:  Post fall with recent hip fracture on outpatient imaging. EXAM: DG HIP (WITH OR WITHOUT PELVIS) 2-3V RIGHT COMPARISON:  Hip radiograph 10/10/2020 FINDINGS: Right femoral neck fracture which is displaced. There is proximal migration of the femoral shaft. The femoral head is well seated in the acetabulum. Pubic rami are intact. Bones are diffusely under mineralized. Pubic symphysis and sacroiliac joints are congruent. IMPRESSION: Displaced right femoral neck fracture. Electronically Signed   By: Narda Rutherford M.D.   On: 10/26/2020 20:31    Procedures Procedures   Medications Ordered in ED Medications  0.9 %  sodium chloride infusion ( Intravenous New Bag/Given 10/26/20 2027)  fentaNYL (SUBLIMAZE) injection 50 mcg (50 mcg Intravenous Given 10/26/20 2027)    ED Course  I have reviewed the triage vital signs and the nursing notes.  Pertinent labs & imaging results that were available during my care of the patient were reviewed by me and considered in my medical decision making (see chart for details).    MDM Rules/Calculators/A&P                          Patient suffers from recurrent falls.  Most recently he fell few days ago.  Is having trouble bearing weight since then.  Likely hip fracture found on outpatient imaging.  Sent here for evaluation.  Family claims the patient is very mobile gets around well in the memory care facility.  She is here  for further evaluation of fall and injury.  Patient is at baseline neurologically overall doing well otherwise.  Screening EKG shows paced rhythm no acute ischemic change interval abnormality otherwise with a PVC.  Labs fairly unremarkable other than anemia.  No reported blood loss source.  Patient is resting comfortably.  Fentanyl given for pain control.  X-ray reviewed by myself and radiology shows right femoral neck fracture.  Will consult orthopedics and will consult medicine for admission. Final Clinical Impression(s) / ED Diagnoses Final diagnoses:  Closed fracture of right hip, initial encounter [facility])  Fall, initial encounter    Rx / DC Orders ED Discharge Orders    None       Sabino Donovan, MD 10/26/20 2052

## 2020-10-26 NOTE — H&P (Signed)
History and Physical    Teresa Richard JME:[id number] DOB: 04-Oct-1940 DOA: 10/26/2020  PCP: Barbette Reichmann, MD  Patient coming from: Memory care unit.  History obtained from patient's daughter.  Patient has advanced dementia.  Chief Complaint: Right hip pain.  HPI: Teresa Richard is a 80 y.o. female with history of advanced dementia and a dementia unit with history of depression, hypothyroidism, complete heart block status post pacemaker placement had a fall Oct 10, 2020 while patient was in the nursing unit.  Was a witnessed fall.  Was taken to [facility] at that time CT head and work-up was negative was sent back to the facility.  Patient had another fall in the bathroom on Oct 14, 2020 was taken again to the ER CT head was unremarkable was sent back to the facility.  Over the last 1 week patient was finding it increasingly difficult to walk because of right hip pain and had to use wheelchair.  Given the worsening pain patient was brought to the ER.  ED Course: X-rays in the ER showed displaced right femoral neck fracture.  Labs show hemoglobin at baseline with anemia and COVID test negative.  EKG shows sinus rhythm LBBB.  On-call orthopedic surgeon Dr. Aundria Rud was consulted patient is admitted for further management of right hip fracture.  COVID test is negative.  Review of Systems: As per HPI, rest all negative.   Past Medical History:  Diagnosis Date  . 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 Anxiety    due to memory loss  . Dementia (HCC)   . Depression   . Dysrhythmia   . Hypothyroidism   . Presence of permanent cardiac pacemaker    complete heart block    Past Surgical History:  Procedure Laterality Date  . BREAST CYST EXCISION Left 1980's   negative  . CATARACT EXTRACTION W/PHACO Left 11/11/2016   Procedure: CATARACT EXTRACTION PHACO AND INTRAOCULAR LENS PLACEMENT (IOC);  Surgeon: Galen Manila, MD;  Location: [facility] ORS;  Service: Ophthalmology;   Laterality: Left;  [place]  00:55 AP% 14.7 CDE 8.15 Fluid pack lot # [id number]  . CTR Right   . EYE SURGERY Bilateral    cataract extractions  . FRACTURE SURGERY Right 2005   LEG. nuts and bolts in knee and foot  . IMPLANTABLE CARDIOVERTER DEFIBRILLATOR (ICD) GENERATOR CHANGE Left 07/07/2018   Procedure: PACER CHANGEOUT;  Surgeon: Marcina Millard, MD;  Location: [facility] ORS;  Service: Cardiovascular;  Laterality: Left;  . INSERT / REPLACE / REMOVE PACEMAKER    . PACEMAKER INSERTION    . RCR Left 2008     reports that she has never smoked. She has never used smokeless tobacco. She reports that she does not drink alcohol and does not use drugs.  Allergies  Allergen Reactions  . Hydrocodone Other (See Comments)    Feels like she is floating  . Oxycodone Other (See Comments)    Feels like she is floating    Family History  Problem Relation Age of Onset  . Breast cancer Neg Hx     Prior to Admission medications   Medication Sig Start Date End Date Taking? Authorizing Provider  ALPRAZolam (XANAX) 0.25 MG tablet Take 1 tablet (0.25 mg total) by mouth daily as needed for anxiety. 02/17/19   Tyrone Nine, MD  aspirin EC 81 MG tablet Take 81 mg by mouth daily.     [provider]  busPIRone (BUSPAR) 5 MG tablet Take 5 mg  by mouth 2 (two) times daily.    [provider]  Cholecalciferol (VITAMIN D3) 1000 units CAPS Take 1,000 Units by mouth daily.     [provider]  Cyanocobalamin (VITAMIN B-12 IJ) Inject 1 application as directed every 4 (four) months. Dr. Marcello FennelHande oversees injection. Patient had last dose in 10/19    [provider]  levothyroxine (SYNTHROID, LEVOTHROID) 75 MCG tablet Take 75 mcg by mouth every morning.     [provider]  magnesium hydroxide (MILK OF MAGNESIA) 400 MG/5ML suspension Take 15 mLs by mouth daily as needed for mild constipation.    [provider]  magnesium oxide (MAG-OX) 400 MG tablet Take 400 mg by mouth  daily.    [provider]  Melatonin 5 MG TABS Take 5 mg by mouth at bedtime.    [provider]  QUEtiapine (SEROQUEL) 50 MG tablet Take 50 mg by mouth 3 (three) times daily. 0800, 1400, 2000    [provider]    Physical Exam: Constitutional: Moderately built and nourished. Vitals:   10/26/20 1900 10/26/20 1930 10/26/20 2036 10/26/20 2059  BP: 129/81 (!) 142/76 (!) 103/53   Pulse: 80 84 79   Resp: 17 15 14    Temp:      TempSrc:      SpO2: 100% 100% 92%   Weight:    59 kg  Height:    5' (1.524 m)   Eyes: Anicteric no pallor. ENMT: No discharge from the ears eyes nose or mouth. Neck: No mass felt.  No neck rigidity. Respiratory: No rhonchi or crepitations. Cardiovascular: S1-S2 heard. Abdomen: Soft nontender bowel sound present. Musculoskeletal: Right lower extremity is externally rotated. Skin: Chronic skin changes. Neurologic: Alert awake oriented to name only. Psychiatric: Appears confused.   Labs on Admission: I have personally reviewed following labs and imaging studies  CBC: Recent Labs  Lab 10/26/20 1954  WBC 5.5  NEUTROABS 3.6  HGB 9.8*  HCT 31.5*  MCV 97.5  PLT 328   Basic Metabolic Panel: Recent Labs  Lab 10/26/20 1954  NA 143  K 4.4  CL 102  CO2 32  GLUCOSE 117*  BUN 31*  CREATININE 0.75  CALCIUM 9.2   GFR: Estimated Creatinine Clearance: 45.1 mL/min (by C-G formula based on SCr of 0.75 mg/dL). Liver Function Tests: No results for input(s): AST, ALT, ALKPHOS, BILITOT, PROT, ALBUMIN in the last 168 hours. No results for input(s): LIPASE, AMYLASE in the last 168 hours. No results for input(s): AMMONIA in the last 168 hours. Coagulation Profile: Recent Labs  Lab 10/26/20 1954  INR 1.0   Cardiac Enzymes: No results for input(s): CKTOTAL, CKMB, CKMBINDEX, TROPONINI in the last 168 hours. BNP (last 3 results) No results for input(s): PROBNP in the last 8760 hours. HbA1C: No results for input(s): HGBA1C in the  last 72 hours. CBG: No results for input(s): GLUCAP in the last 168 hours. Lipid Profile: No results for input(s): CHOL, HDL, LDLCALC, TRIG, CHOLHDL, LDLDIRECT in the last 72 hours. Thyroid Function Tests: No results for input(s): TSH, T4TOTAL, FREET4, T3FREE, THYROIDAB in the last 72 hours. Anemia Panel: No results for input(s): VITAMINB12, FOLATE, FERRITIN, TIBC, IRON, RETICCTPCT in the last 72 hours. Urine analysis:    Component Value Date/Time   COLORURINE YELLOW (A) 05/08/2020 0502   APPEARANCEUR HAZY (A) 05/08/2020 0502   LABSPEC 1.024 05/08/2020 0502   PHURINE 5.0 05/08/2020 0502   GLUCOSEU NEGATIVE 05/08/2020 0502   HGBUR NEGATIVE 05/08/2020 0502   BILIRUBINUR  NEGATIVE 05/08/2020 0502   KETONESUR NEGATIVE 05/08/2020 0502   PROTEINUR NEGATIVE 05/08/2020 0502   NITRITE NEGATIVE 05/08/2020 0502   LEUKOCYTESUR NEGATIVE 05/08/2020 0502   Sepsis Labs: @LABRCNTIP (procalcitonin:4,lacticidven:4) ) Recent Results (from the past 240 hour(s))  Resp Panel by RT-PCR (Flu A&B, Covid) Nasopharyngeal Swab     Status: None   Collection Time: 10/26/20  7:26 PM   Specimen: Nasopharyngeal Swab; Nasopharyngeal(NP) swabs in vial transport medium  Result Value Ref Range Status   SARS Coronavirus 2 by RT PCR NEGATIVE NEGATIVE Final    Comment: (NOTE) SARS-CoV-2 target nucleic acids are NOT DETECTED.  The SARS-CoV-2 RNA is generally detectable in upper respiratory specimens during the acute phase of infection. The lowest concentration of SARS-CoV-2 viral copies this assay can detect is 138 copies/mL. A negative result does not preclude SARS-Cov-2 infection and should not be used as the sole basis for treatment or other patient management decisions. A negative result may occur with  improper specimen collection/handling, submission of specimen other than nasopharyngeal swab, presence of viral mutation(s) within the areas targeted by this assay, and inadequate number of viral copies(<138  copies/mL). A negative result must be combined with clinical observations, patient history, and epidemiological information. The expected result is Negative.  Fact Sheet for Patients:  10/28/20  Fact Sheet for Healthcare Providers:  BloggerCourse.com  This test is no t yet approved or cleared by the [employer] FDA and  has been authorized for detection and/or diagnosis of SARS-CoV-2 by FDA under an Emergency Use Authorization (EUA). This EUA will remain  in effect (meaning this test can be used) for the duration of the COVID-19 declaration under Section 564(b)(1) of the Act, 21 U.S.C.section 360bbb-3(b)(1), unless the authorization is terminated  or revoked sooner.       Influenza A by PCR NEGATIVE NEGATIVE Final   Influenza B by PCR NEGATIVE NEGATIVE Final    Comment: (NOTE) The Xpert Xpress SARS-CoV-2/FLU/RSV plus assay is intended as an aid in the diagnosis of influenza from Nasopharyngeal swab specimens and should not be used as a sole basis for treatment. Nasal washings and aspirates are unacceptable for Xpert Xpress SARS-CoV-2/FLU/RSV testing.  Fact Sheet for Patients: [place]  Fact Sheet for Healthcare Providers: BloggerCourse.com  This test is not yet approved or cleared by the [employer]ious[employer].[place] FDA and has been authorized for detection and/or diagnosis of SARS-CoV-2 by FDA under an Emergency Use Authorization (EUA). This EUA will remain in effect (meaning this test can be used) for the duration of the COVID-19 declaration under Section 564(b)(1) of the Act, 21 U.S.C. section 360bbb-3(b)(1), unless the authorization is terminated or revoked.  Performed at [facility], [address], [place], [place]      Radiological Exams on Admission: DG Knee 1-2 Views Right  Result Date: 10/26/2020 CLINICAL DATA:  Unwitnessed fall. EXAM:  RIGHT KNEE - 1-2 VIEW COMPARISON:  None. FINDINGS: Bones diffusely under mineralized. Prior lateral plate and multi screw fixation of the proximal tibia. No evidence of acute fracture or periprosthetic lucency. Moderate tricompartmental osteoarthritis. No significant knee joint effusion. IMPRESSION: 1. No acute fracture or dislocation of the right knee. 2. Prior proximal tibial ORIF without complication. 3. Moderate tricompartmental osteoarthritis. Electronically Signed   By: 10/28/2020 M.D.   On: 10/26/2020 20:34   DG Hip Unilat W or Wo Pelvis 2-3 Views Right  Result Date: 10/26/2020 CLINICAL DATA:  Post fall with recent hip fracture on outpatient imaging. EXAM: DG HIP (WITH OR WITHOUT  PELVIS) 2-3V RIGHT COMPARISON:  Hip radiograph 10/10/2020 FINDINGS: Right femoral neck fracture which is displaced. There is proximal migration of the femoral shaft. The femoral head is well seated in the acetabulum. Pubic rami are intact. Bones are diffusely under mineralized. Pubic symphysis and sacroiliac joints are congruent. IMPRESSION: Displaced right femoral neck fracture. Electronically Signed   By: Narda Rutherford M.D.   On: 10/26/2020 20:31    EKG: Independently reviewed.  Sinus rhythm LBBB.  Assessment/Plan Principal Problem:   Closed right hip fracture, initial encounter [facility]) Active Problems:   H/O cardiac pacemaker   Thyroid disease   Pancytopenia (HCC)   B12 deficiency anemia   Hip fracture (HCC)    1. Right hip fracture -on-call orthopedic surgeon Dr. Aundria Rud has been consulted.  He will be seeing in consult for further recommendations.  To the ambulatory nature patient likely will need surgery.  Pain medications. 2. Hypothyroidism on Synthroid. 3. Complete heart block status post pacemaker placement. 4. History of dementia with history of depression and anxiety on BuSpar Xanax as needed Lexapro, Depakote pimavanserin and Seroquel. 5. Anemia on B12 supplements.  Since patient has hip  fracture will need further management and more than 2 midnight stay in inpatient status.   DVT prophylaxis: SCDs.  Avoiding anticoagulation anticipation of possible surgery. Code Status: DNR confirmed with patient's daughter. Family Communication: Patient's daughter. Disposition Plan: Back to facility when stable may need rehab. Consults called: Orthopedics. Admission status: Inpatient.   Eduard Clos MD [facility] Pager [phone].  If 7PM-7AM, please contact night-coverage [email or web address] Password [facility]  10/26/2020, 9:31 PM

## 2020-10-26 NOTE — H&P (View-Only) (Signed)
ORTHOPAEDIC Consultation  REQUESTING PHYSICIAN: Eduard Clos, MD  PCP:  Barbette Reichmann, MD  Chief Complaint: Right hip pain  HPI: Teresa Richard is a 80 y.o. female who complains of right hip pain.  She has pretty significant dementia and lives at a memory care unit.  She was noted by her care providers there to have decreased mobility starting about 2 or 3 days ago.  She has been spending more time in the wheelchair.  She has been known in the past couple weeks have multiple unwitnessed falls.  She has been to the emergency department 3 times in the last 3 weeks.  She is accompanied by her daughter who does provide the history given her dementia at baseline.  Denies any medical comorbidities that would require anticoagulation.  She does have hypercholesterolemia as well as hypertension in the above-stated dementia.  No history of hip surgery in the past.  Past Medical History:  Diagnosis Date  . 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 Anxiety    due to memory loss  . Dementia (HCC)   . Depression   . Dysrhythmia   . Hypothyroidism   . 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 BREAST CYST EXCISION Left 1980's   negative  . CATARACT EXTRACTION W/PHACO Left 11/11/2016   Procedure: CATARACT EXTRACTION PHACO AND INTRAOCULAR LENS PLACEMENT (IOC);  Surgeon: Galen Manila, MD;  Location: [facility] ORS;  Service: Ophthalmology;  Laterality: Left;  [place]  00:55 AP% 14.7 CDE 8.15 Fluid pack lot # [id number]  . CTR Right   . EYE SURGERY Bilateral    cataract extractions  . FRACTURE SURGERY Right 2005   LEG. nuts and bolts in knee and foot  . IMPLANTABLE CARDIOVERTER DEFIBRILLATOR (ICD) GENERATOR CHANGE Left 07/07/2018   Procedure: PACER CHANGEOUT;  Surgeon: Marcina Millard, MD;  Location: [facility] ORS;  Service: Cardiovascular;  Laterality: Left;  . INSERT / REPLACE / REMOVE PACEMAKER    . PACEMAKER  INSERTION    . RCR Left 2008   Social History   Socioeconomic History  . Marital status: Widowed    Spouse name: Not on file  . Number of children: Not on file  . Years of education: Not on file  . Highest education level: Not on file  Occupational History  . 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 Smoking status: Never Smoker  . Smokeless tobacco: Never Used  Vaping Use  . Vaping Use: Never used  Substance and Sexual Activity  . Alcohol use: No  . Drug use: No  . Sexual activity: Not Currently  Other Topics Concern  . Not on file  Social History Narrative  . Not on file   Social Determinants of Health   Financial Resource Strain: Not on file  Food Insecurity: Not on file  Transportation Needs: Not on file  Physical Activity: Not on file  Stress: Not on file  Social Connections: Not on file   Family History  Problem Relation Age of Onset  . Breast cancer Neg Hx    Allergies  Allergen Reactions  . Hydrocodone Other (See Comments)    Feels like she is floating  . Oxycodone Other (See Comments)    Feels like she is floating   Prior to Admission medications   Medication Sig Start Date End Date Taking? Authorizing Provider  ALPRAZolam Prudy Feeler) 0.25  MG tablet Take 1 tablet (0.25 mg total) by mouth daily as needed for anxiety. 02/17/19   Tyrone Nine, MD  aspirin EC 81 MG tablet Take 81 mg by mouth daily.     [provider]  busPIRone (BUSPAR) 5 MG tablet Take 5 mg by mouth 2 (two) times daily.    [provider]  Cholecalciferol (VITAMIN D3) 1000 units CAPS Take 1,000 Units by mouth daily.     [provider]  Cyanocobalamin (VITAMIN B-12 IJ) Inject 1 application as directed every 4 (four) months. Dr. Marcello Fennel oversees injection. Patient had last dose in 10/19    [provider]  levothyroxine (SYNTHROID, LEVOTHROID) 75 MCG tablet Take 75 mcg by mouth every morning.     [provider]  magnesium  hydroxide (MILK OF MAGNESIA) 400 MG/5ML suspension Take 15 mLs by mouth daily as needed for mild constipation.    [provider]  magnesium oxide (MAG-OX) 400 MG tablet Take 400 mg by mouth daily.    [provider]  Melatonin 5 MG TABS Take 5 mg by mouth at bedtime.    [provider]  QUEtiapine (SEROQUEL) 50 MG tablet Take 50 mg by mouth 3 (three) times daily. 0800, 1400, 2000    [provider]   DG Knee 1-2 Views Right  Result Date: 10/26/2020 CLINICAL DATA:  Unwitnessed fall. EXAM: RIGHT KNEE - 1-2 VIEW COMPARISON:  None. FINDINGS: Bones diffusely under mineralized. Prior lateral plate and multi screw fixation of the proximal tibia. No evidence of acute fracture or periprosthetic lucency. Moderate tricompartmental osteoarthritis. No significant knee joint effusion. IMPRESSION: 1. No acute fracture or dislocation of the right knee. 2. Prior proximal tibial ORIF without complication. 3. Moderate tricompartmental osteoarthritis. Electronically Signed   By: Narda Rutherford M.D.   On: 10/26/2020 20:34   DG Hip Unilat W or Wo Pelvis 2-3 Views Right  Result Date: 10/26/2020 CLINICAL DATA:  Post fall with recent hip fracture on outpatient imaging. EXAM: DG HIP (WITH OR WITHOUT PELVIS) 2-3V RIGHT COMPARISON:  Hip radiograph 10/10/2020 FINDINGS: Right femoral neck fracture which is displaced. There is proximal migration of the femoral shaft. The femoral head is well seated in the acetabulum. Pubic rami are intact. Bones are diffusely under mineralized. Pubic symphysis and sacroiliac joints are congruent. IMPRESSION: Displaced right femoral neck fracture. Electronically Signed   By: Narda Rutherford M.D.   On: 10/26/2020 20:31    Positive ROS: All other systems have been reviewed and were otherwise negative with the exception of those mentioned in the HPI and as above.  Physical Exam: General: Alert, no acute distress Cardiovascular: No pedal edema Respiratory: No  cyanosis, no use of accessory musculature GI: No organomegaly, abdomen is soft and non-tender Skin: No lesions in the area of chief complaint Neurologic: Sensation intact distally Psychiatric: Patient is competent for consent with normal mood and affect Lymphatic: No axillary or cervical lymphadenopathy  MUSCULOSKELETAL:  Right lower extremity is shortened and externally rotated.  Otherwise 2+ dorsalis pedis pulse.  Spontaneously moves.  Unable to comply with focal exam due to dementia.  Assessment: Right femoral neck fracture  Plan: -Discussed with the patient's daughter at the bedside that this is a injury that would require operative management.  At this time, it would appear that she may be able to be posted for surgery on Monday.  She will need an arthroplasty of the right hip and this is something that I would defer to my arthroplasty partners  for.  Will discuss further with Dr. Charlann Boxer who can potentially do the surgery on Monday.  We will update the team as soon as we have definitive timing for that.  In the interim she can have a diet at this time and will be on bedrest until surgery.  She can have chemical DVT prophylaxis on Saturday and Sunday.    Yolonda Kida, MD Cell [phone]    10/26/2020 11:10 PM

## 2020-10-27 DIAGNOSIS — E079 Disorder of thyroid, unspecified: Secondary | ICD-10-CM

## 2020-10-27 DIAGNOSIS — D519 Vitamin B12 deficiency anemia, unspecified: Secondary | ICD-10-CM

## 2020-10-27 DIAGNOSIS — W19XXXA Unspecified fall, initial encounter: Secondary | ICD-10-CM

## 2020-10-27 DIAGNOSIS — S72001A Fracture of unspecified part of neck of right femur, initial encounter for closed fracture: Secondary | ICD-10-CM | POA: Diagnosis not present

## 2020-10-27 LAB — BASIC METABOLIC PANEL
Anion gap: 8 (ref 5–15)
BUN: 28 mg/dL — ABNORMAL HIGH (ref 8–23)
CO2: 29 mmol/L (ref 22–32)
Calcium: 9.1 mg/dL (ref 8.9–10.3)
Chloride: 105 mmol/L (ref 98–111)
Creatinine, Ser: 0.68 mg/dL (ref 0.44–1.00)
GFR, Estimated: >60 mL/min (ref >60)
Glucose, Bld: 97 mg/dL (ref 70–99)
Potassium: 3.7 mmol/L (ref 3.5–5.1)
Sodium: 142 mmol/L (ref 135–145)

## 2020-10-27 LAB — CBC
HCT: 31.5 % — ABNORMAL LOW (ref 36.0–46.0)
Hemoglobin: 9.9 g/dL — ABNORMAL LOW (ref 12.0–15.0)
MCH: 30.2 pg (ref 26.0–34.0)
MCHC: 31.4 g/dL (ref 30.0–36.0)
MCV: 96 fL (ref 80.0–100.0)
Platelets: 306 10*3/uL (ref 150–400)
RBC: 3.28 MIL/uL — ABNORMAL LOW (ref 3.87–5.11)
RDW: 13.4 % (ref 11.5–15.5)
WBC: 5.7 10*3/uL (ref 4.0–10.5)
nRBC: 0 % (ref 0.0–0.2)

## 2020-10-27 MED ORDER — BUSPIRONE HCL 5 MG PO TABS
7.5000 mg | ORAL_TABLET | Freq: Every day | ORAL | Status: DC
  Administered 2020-10-27 – 2020-11-06 (×10): 7.5 mg via ORAL
  Filled 2020-10-27 (×10): qty 2

## 2020-10-27 MED ORDER — DIVALPROEX SODIUM 125 MG PO CSDR
125.0000 mg | DELAYED_RELEASE_CAPSULE | Freq: Two times a day (BID) | ORAL | Status: DC
  Administered 2020-10-27 – 2020-11-06 (×19): 125 mg via ORAL
  Filled 2020-10-27 (×23): qty 1

## 2020-10-27 MED ORDER — LEVOTHYROXINE SODIUM 75 MCG PO TABS
75.0000 ug | ORAL_TABLET | Freq: Every day | ORAL | Status: DC
  Administered 2020-10-27 – 2020-11-06 (×9): 75 ug via ORAL
  Filled 2020-10-27 (×11): qty 1

## 2020-10-27 MED ORDER — PIMAVANSERIN TARTRATE 34 MG PO CAPS
34.0000 mg | ORAL_CAPSULE | Freq: Every day | ORAL | Status: DC
  Administered 2020-10-27 – 2020-11-06 (×11): 34 mg via ORAL
  Filled 2020-10-27 (×13): qty 1

## 2020-10-27 MED ORDER — ALPRAZOLAM 0.5 MG PO TABS
0.5000 mg | ORAL_TABLET | Freq: Two times a day (BID) | ORAL | Status: DC | PRN
  Administered 2020-10-28 – 2020-10-30 (×3): 0.5 mg via ORAL
  Filled 2020-10-27 (×3): qty 1

## 2020-10-27 MED ORDER — BUSPIRONE HCL 10 MG PO TABS
10.0000 mg | ORAL_TABLET | Freq: Two times a day (BID) | ORAL | Status: DC
  Administered 2020-10-27 – 2020-11-06 (×19): 10 mg via ORAL
  Filled 2020-10-27 (×19): qty 1

## 2020-10-27 MED ORDER — FENTANYL CITRATE (PF) 100 MCG/2ML IJ SOLN
50.0000 ug | INTRAMUSCULAR | Status: DC | PRN
  Administered 2020-10-30: 50 ug via INTRAVENOUS
  Filled 2020-10-27: qty 2

## 2020-10-27 MED ORDER — QUETIAPINE FUMARATE 100 MG PO TABS
100.0000 mg | ORAL_TABLET | Freq: Every day | ORAL | Status: DC
  Administered 2020-10-27 – 2020-11-05 (×9): 100 mg via ORAL
  Filled 2020-10-27 (×10): qty 1

## 2020-10-27 MED ORDER — ONDANSETRON HCL 4 MG/2ML IJ SOLN: 4.0000 mg | Freq: Four times a day (QID) | INTRAMUSCULAR | Status: DC | PRN

## 2020-10-27 MED ORDER — FERROUS SULFATE 325 (65 FE) MG PO TABS
325.0000 mg | ORAL_TABLET | Freq: Every day | ORAL | Status: DC
  Administered 2020-10-27 – 2020-11-06 (×10): 325 mg via ORAL
  Filled 2020-10-27 (×10): qty 1

## 2020-10-27 MED ORDER — VITAMIN B-12 1000 MCG PO TABS
500.0000 ug | ORAL_TABLET | Freq: Every day | ORAL | Status: DC
  Administered 2020-10-27 – 2020-11-06 (×10): 500 ug via ORAL
  Filled 2020-10-27 (×11): qty 1

## 2020-10-27 MED ORDER — DICLOFENAC SODIUM 1 % EX GEL
2.0000 g | Freq: Four times a day (QID) | CUTANEOUS | Status: DC
  Administered 2020-10-27 – 2020-11-06 (×35): 2 g via TOPICAL
  Filled 2020-10-27: qty 100

## 2020-10-27 MED ORDER — MELATONIN 5 MG PO TABS
10.0000 mg | ORAL_TABLET | Freq: Every day | ORAL | Status: DC
  Administered 2020-10-27 – 2020-11-05 (×7): 10 mg via ORAL
  Filled 2020-10-27 (×8): qty 2

## 2020-10-27 MED ORDER — QUETIAPINE FUMARATE 25 MG PO TABS
75.0000 mg | ORAL_TABLET | Freq: Every morning | ORAL | Status: DC
  Administered 2020-10-27 – 2020-11-06 (×8): 75 mg via ORAL
  Filled 2020-10-27: qty 1
  Filled 2020-10-27: qty 3
  Filled 2020-10-27 (×3): qty 1
  Filled 2020-10-27 (×4): qty 3

## 2020-10-27 MED ORDER — ACETAMINOPHEN 325 MG PO TABS
650.0000 mg | ORAL_TABLET | Freq: Four times a day (QID) | ORAL | Status: DC | PRN
Start: 2020-10-27 — End: 2020-10-31
  Administered 2020-10-29 – 2020-10-30 (×3): 650 mg via ORAL
  Filled 2020-10-27 (×3): qty 2

## 2020-10-27 MED ORDER — ESCITALOPRAM OXALATE 10 MG PO TABS
10.0000 mg | ORAL_TABLET | Freq: Every day | ORAL | Status: DC
  Administered 2020-10-27 – 2020-11-06 (×11): 10 mg via ORAL
  Filled 2020-10-27 (×11): qty 1

## 2020-10-27 NOTE — Progress Notes (Signed)
Initial Nutrition Assessment  RD working remotely.  DOCUMENTATION CODES:   Not applicable  INTERVENTION:   - Please obtain updated weight  - Magic Cup TID with meals, each supplement provides 290 kcal and 9 grams of protein  - Encourage adequate PO intake and provide feeding assistance as needed  NUTRITION DIAGNOSIS:   Increased nutrient needs related to hip fracture as evidenced by estimated needs.  GOAL:   Patient will meet greater than or equal to 90% of their needs  MONITOR:   Labs,PO intake,Supplement acceptance,Weight trends  REASON FOR ASSESSMENT:   Consult Hip fracture protocol  ASSESSMENT:   80 year old female who presented to the ED on 5/27 from SNF. Pt had an unwitnessed fall on 5/15 and SNF completed an x-ray on pt showing R femur fracture. PMH of advanced dementia, depression, hypothyroidism, complete heart block s/p pacemaker.   Per notes, pt will likely have arthroplasty of the R hip on Monday. Pt is currently on a Regular diet. No meal completions charted at this time.  Spoke with pt's daughter Zella Ball via phone call to room. Zella Ball reports that pt just finished breakfast this morning and consumed 50% of eggs, 1 piece of toast, 2 pieces of bacon, and almost all of a cranberry juice. Zella Ball reports that pt's appetite and PO intake at the SNF where she resides is "up and down." Some days pt will eat more and other days she will refuse a meal. Zella Ball reports that more recently staff had started feeding pt but that pt is able to self-feed some finger food items.  Pt's UBW is between 110-115 lbs. During this past holiday season, pt gained up to 128 lbs but this is not where her weigh normally is. Zella Ball states that weight of 130 lbs in chart is not accurate. Zella Ball reports that pt's SNF recently switched from weekly weights to monthly weights and that pt's most recent weight was 116 lbs a few weeks ago.  Pt is at risk for malnutrition and may already meet criteria but  RD unable to confirm without NFPE.  Zella Ball reports that pt has tried Ensure/Boost at the SNF but did not consume it. Zella Ball reports pt loves ice cream. Will try Magic Cups TID with meals to maximize kcal and protein intake prior to surgery. Discussed importance of adequate kcal and protein intake in healing. Zella Ball expresses understanding.  Medications reviewed and include: ferrous sulfate, melatonin, vitamin B-12  Labs reviewed: BUN 31, hemoglobin 9.8  NUTRITION - FOCUSED PHYSICAL EXAM:  Unable to complete at this time. RD working remotely.  Diet Order:   Diet Order            Diet regular Room service appropriate? Yes; Fluid consistency: Thin  Diet effective now                 EDUCATION NEEDS:   Education needs have been addressed  Skin:  Skin Assessment: Reviewed RN Assessment  Last BM:  no documented BM  Height:   Ht Readings from Last 1 Encounters:  10/26/20 5' (1.524 m)    Weight:   Wt Readings from Last 1 Encounters:  10/26/20 59 kg    BMI:  Body mass index is 25.4 kg/m.  Estimated Nutritional Needs:   Kcal:  1450-1650  Protein:  65-80 grams  Fluid:  1.4-1.6 L    Mertie Clause, MS, RD, LDN Inpatient Clinical Dietitian Please see AMiON for contact information.

## 2020-10-27 NOTE — TOC CAGE-AID Note (Signed)
Transition of Care [facility]) - CAGE-AID Screening   Patient Details  Name: Teresa Richard MRN: [id number] Date of Birth: May 15, 1941  Transition of Care [facility] CM/SW Contact:    Janora Norlander, RN Phone Number: [phone] 10/27/2020, 10:22 AM   Clinical Narrative: Pt from memory care unit and has had several falls over the past few weeks.  She has a displaced femoral neck fracture.  Pt has dementia and is unable to participate in screening.   CAGE-AID Screening: Substance Abuse Screening unable to be completed due to: : Patient unable to participate

## 2020-10-27 NOTE — Progress Notes (Addendum)
PROGRESS NOTE        PATIENT DETAILS Name: Teresa Richard Age: 80 y.o. Sex: female Date of Birth: 23-Sep-1940 Admit Date: 10/26/2020 Admitting Physician Eduard Clos, MD RKY:HCWCB, Roderic Palau, MD  Brief Narrative: Patient is a 80 y.o. female with history of dementia, complete heart block-s/p PPM placement, hypothyroidism-presenting with a mechanical fall-found to have right hip fracture.  Significant events: 5/27>> admit for right hip fracture  Significant studies: 5/27>> x-ray right hip: Displaced right femoral neck fracture. 5/27>> x-ray right knee: No fracture  Antimicrobial therapy: None  Microbiology data: 5/27>> influenza/COVID PCR: Negative  Procedures : None  Consults: Ortho  DVT Prophylaxis : SCDs Start: 10/26/20 2130   Subjective: Pleasantly confused-lying comfortably in bed earlier this morning.   Assessment/Plan: Right hip fracture: Following a mechanical fall-orthopedics following-defer further to orthopedics.  Given her age/other medical comorbidities-patient is at moderate to high risk for any surgical procedure-however she is as optimized as one could be-discussed with daughter over the phone-DNR confirmed-she is accepting all risks and wants to proceed with surgery.  History of complete heart block-s/p PPM placement  Hypothyroidism: Continue Synthroid  History of dementia: At risk for delirium-maintain delirium precautions.  On Depakote/Seroquel.  Depression/anxiety: Continue Lexapro/BuSpar-and as needed Xanax  Normocytic anemia: Stable for follow-up-on iron supplementation  Vitamin B12 deficiency: On supplementation  Diet: Diet Order            Diet regular Room service appropriate? Yes; Fluid consistency: Thin  Diet effective now                  Code Status:  DNR  Family Communication: Daughter-[name]-[phone] on 5/28  Disposition Plan: Status is: Inpatient  Remains inpatient appropriate  because:Inpatient level of care appropriate due to severity of illness   Dispo: The patient is from: SNF              Anticipated d/c is to: SNF              Patient currently is not medically stable to d/c.   Difficult to place patient No   Barriers to Discharge: Right hip fracture-awaiting operative repair  Antimicrobial agents: Anti-infectives (From admission, onward)   None       Time spent: 25- minutes-Greater than 50% of this time was spent in counseling, explanation of diagnosis, planning of further management, and coordination of care.  MEDICATIONS: Scheduled Meds: . busPIRone  10 mg Oral BID  . diclofenac Sodium  2 g Topical QID  . divalproex  125 mg Oral BID  . escitalopram  10 mg Oral Daily  . ferrous sulfate  325 mg Oral Q breakfast  . levothyroxine  75 mcg Oral Q0600  . melatonin  10 mg Oral QHS  . Pimavanserin Tartrate  34 mg Oral Daily  . QUEtiapine  100 mg Oral QHS  . QUEtiapine  75 mg Oral q AM  . vitamin B-12  500 mcg Oral Daily   Continuous Infusions: PRN Meds:.ALPRAZolam, morphine injection   PHYSICAL EXAM: Vital signs: Vitals:   10/27/20 0812 10/27/20 0819 10/27/20 0840 10/27/20 1300  BP:  (!) 142/72 (!) 137/57 98/60  Pulse:  84 72 70  Resp:  16 18   Temp: 98.3 F (36.8 C) 98.7 F (37.1 C) 98 F (36.7 C) 97.6 F (36.4 C)  TempSrc: Oral Oral Oral Oral  SpO2:  100%  98% 99%  Weight:      Height:       Filed Weights   10/26/20 2059  Weight: 59 kg   Body mass index is 25.4 kg/m.   Gen Exam: Pleasantly confused HEENT:atraumatic, normocephalic Chest: B/L clear to auscultation anteriorly CVS:S1S2 regular Abdomen:soft non tender, non distended Extremities:no edema Neurology: Difficult to evaluate-but appears nonfocal. Skin: no rash  I have personally reviewed following labs and imaging studies  LABORATORY DATA: CBC: Recent Labs  Lab 10/26/20 1954 10/27/20 0948  WBC 5.5 5.7  NEUTROABS 3.6  --   HGB 9.8* 9.9*  HCT 31.5*  31.5*  MCV 97.5 96.0  PLT 328 306    Basic Metabolic Panel: Recent Labs  Lab 10/26/20 1954 10/27/20 0948  NA 143 142  K 4.4 3.7  CL 102 105  CO2 32 29  GLUCOSE 117* 97  BUN 31* 28*  CREATININE 0.75 0.68  CALCIUM 9.2 9.1    GFR: Estimated Creatinine Clearance: 45.1 mL/min (by C-G formula based on SCr of 0.68 mg/dL).  Liver Function Tests: No results for input(s): AST, ALT, ALKPHOS, BILITOT, PROT, ALBUMIN in the last 168 hours. No results for input(s): LIPASE, AMYLASE in the last 168 hours. No results for input(s): AMMONIA in the last 168 hours.  Coagulation Profile: Recent Labs  Lab 10/26/20 1954  INR 1.0    Cardiac Enzymes: No results for input(s): CKTOTAL, CKMB, CKMBINDEX, TROPONINI in the last 168 hours.  BNP (last 3 results) No results for input(s): PROBNP in the last 8760 hours.  Lipid Profile: No results for input(s): CHOL, HDL, LDLCALC, TRIG, CHOLHDL, LDLDIRECT in the last 72 hours.  Thyroid Function Tests: No results for input(s): TSH, T4TOTAL, FREET4, T3FREE, THYROIDAB in the last 72 hours.  Anemia Panel: No results for input(s): VITAMINB12, FOLATE, FERRITIN, TIBC, IRON, RETICCTPCT in the last 72 hours.  Urine analysis:    Component Value Date/Time   COLORURINE YELLOW (A) 05/08/2020 0502   APPEARANCEUR HAZY (A) 05/08/2020 0502   LABSPEC 1.024 05/08/2020 0502   PHURINE 5.0 05/08/2020 0502   GLUCOSEU NEGATIVE 05/08/2020 0502   HGBUR NEGATIVE 05/08/2020 0502   BILIRUBINUR NEGATIVE 05/08/2020 0502   KETONESUR NEGATIVE 05/08/2020 0502   PROTEINUR NEGATIVE 05/08/2020 0502   NITRITE NEGATIVE 05/08/2020 0502   LEUKOCYTESUR NEGATIVE 05/08/2020 0502    Sepsis Labs: Lactic Acid, Venous No results found for: LATICACIDVEN  MICROBIOLOGY: Recent Results (from the past 240 hour(s))  Resp Panel by RT-PCR (Flu A&B, Covid) Nasopharyngeal Swab     Status: None   Collection Time: 10/26/20  7:26 PM   Specimen: Nasopharyngeal Swab; Nasopharyngeal(NP) swabs  in vial transport medium  Result Value Ref Range Status   SARS Coronavirus 2 by RT PCR NEGATIVE NEGATIVE Final    Comment: (NOTE) SARS-CoV-2 target nucleic acids are NOT DETECTED.  The SARS-CoV-2 RNA is generally detectable in upper respiratory specimens during the acute phase of infection. The lowest concentration of SARS-CoV-2 viral copies this assay can detect is 138 copies/mL. A negative result does not preclude SARS-Cov-2 infection and should not be used as the sole basis for treatment or other patient management decisions. A negative result may occur with  improper specimen collection/handling, submission of specimen other than nasopharyngeal swab, presence of viral mutation(s) within the areas targeted by this assay, and inadequate number of viral copies(<138 copies/mL). A negative result must be combined with clinical observations, patient history, and epidemiological information. The expected result is Negative.  Fact Sheet for Patients:  BloggerCourse.com  Fact  Sheet for Healthcare Providers:  SeriousBroker.it  This test is no t yet approved or cleared by the [place] FDA and  has been authorized for detection and/or diagnosis of SARS-CoV-2 by FDA under an Emergency Use Authorization (EUA). This EUA will remain  in effect (meaning this test can be used) for the duration of the COVID-19 declaration under Section 564(b)(1) of the Act, 21 U.S.C.section 360bbb-3(b)(1), unless the authorization is terminated  or revoked sooner.       Influenza A by PCR NEGATIVE NEGATIVE Final   Influenza B by PCR NEGATIVE NEGATIVE Final    Comment: (NOTE) The Xpert Xpress SARS-CoV-2/FLU/RSV plus assay is intended as an aid in the diagnosis of influenza from Nasopharyngeal swab specimens and should not be used as a sole basis for treatment. Nasal washings and aspirates are unacceptable for Xpert Xpress  SARS-CoV-2/FLU/RSV testing.  Fact Sheet for Patients: BloggerCourse.com  Fact Sheet for Healthcare Providers: SeriousBroker.it  This test is not yet approved or cleared by the [place] FDA and has been authorized for detection and/or diagnosis of SARS-CoV-2 by FDA under an Emergency Use Authorization (EUA). This EUA will remain in effect (meaning this test can be used) for the duration of the COVID-19 declaration under Section 564(b)(1) of the Act, 21 U.S.C. section 360bbb-3(b)(1), unless the authorization is terminated or revoked.  Performed at [facility], [address] [place], [place] [address]     RADIOLOGY STUDIES/RESULTS: DG Knee 1-2 Views Right  Result Date: 10/26/2020 CLINICAL DATA:  Unwitnessed fall. EXAM: RIGHT KNEE - 1-2 VIEW COMPARISON:  None. FINDINGS: Bones diffusely under mineralized. Prior lateral plate and multi screw fixation of the proximal tibia. No evidence of acute fracture or periprosthetic lucency. Moderate tricompartmental osteoarthritis. No significant knee joint effusion. IMPRESSION: 1. No acute fracture or dislocation of the right knee. 2. Prior proximal tibial ORIF without complication. 3. Moderate tricompartmental osteoarthritis. Electronically Signed   By: Narda Rutherford M.D.   On: 10/26/2020 20:34   DG Hip Unilat W or Wo Pelvis 2-3 Views Right  Result Date: 10/26/2020 CLINICAL DATA:  Post fall with recent hip fracture on outpatient imaging. EXAM: DG HIP (WITH OR WITHOUT PELVIS) 2-3V RIGHT COMPARISON:  Hip radiograph 10/10/2020 FINDINGS: Right femoral neck fracture which is displaced. There is proximal migration of the femoral shaft. The femoral head is well seated in the acetabulum. Pubic rami are intact. Bones are diffusely under mineralized. Pubic symphysis and sacroiliac joints are congruent. IMPRESSION: Displaced right femoral neck fracture. Electronically Signed   By: Narda Rutherford  M.D.   On: 10/26/2020 20:31     LOS: 1 day   Jeoffrey Massed, MD  [facility]    To contact the attending provider between 7A-7P or the covering provider during after hours 7P-7A, please log into the web site [email or web address] and access using universal [place] password for that web site. If you do not have the password, please call the hospital operator.  10/27/2020, 1:34 PM

## 2020-10-27 NOTE — ED Notes (Signed)
Pt refused blood draw

## 2020-10-27 NOTE — ED Notes (Signed)
Attempted x 2. "they are in the middle of giving report".

## 2020-10-27 NOTE — ED Notes (Signed)
Pt refused blood draw from this RN at this time.

## 2020-10-27 NOTE — Progress Notes (Signed)
Orthopedic Tech Progress Note Patient Details:  Teresa Richard 11/05/1940 [id number]  Patient ID: Teresa Richard, female   DOB: [birth year], 80 y.o.   MRN: [id number] Pt doesn't meet criteria for OHF.  Teresa Richard 10/27/2020, 8:37 PM

## 2020-10-27 NOTE — ED Notes (Signed)
Bed has not been assigned. In the middle of shift change. Will call back to take report.

## 2020-10-28 DIAGNOSIS — E079 Disorder of thyroid, unspecified: Secondary | ICD-10-CM | POA: Diagnosis not present

## 2020-10-28 DIAGNOSIS — D519 Vitamin B12 deficiency anemia, unspecified: Secondary | ICD-10-CM | POA: Diagnosis not present

## 2020-10-28 DIAGNOSIS — S72001A Fracture of unspecified part of neck of right femur, initial encounter for closed fracture: Secondary | ICD-10-CM | POA: Diagnosis not present

## 2020-10-28 DIAGNOSIS — W19XXXA Unspecified fall, initial encounter: Secondary | ICD-10-CM | POA: Diagnosis not present

## 2020-10-28 LAB — CBC
HCT: 31.5 % — ABNORMAL LOW (ref 36.0–46.0)
Hemoglobin: 9.7 g/dL — ABNORMAL LOW (ref 12.0–15.0)
MCH: 29.9 pg (ref 26.0–34.0)
MCHC: 30.8 g/dL (ref 30.0–36.0)
MCV: 97.2 fL (ref 80.0–100.0)
Platelets: 292 10*3/uL (ref 150–400)
RBC: 3.24 MIL/uL — ABNORMAL LOW (ref 3.87–5.11)
RDW: 13.3 % (ref 11.5–15.5)
WBC: 4.6 10*3/uL (ref 4.0–10.5)
nRBC: 0 % (ref 0.0–0.2)

## 2020-10-28 LAB — BASIC METABOLIC PANEL
Anion gap: 3 — ABNORMAL LOW (ref 5–15)
BUN: 24 mg/dL — ABNORMAL HIGH (ref 8–23)
CO2: 34 mmol/L — ABNORMAL HIGH (ref 22–32)
Calcium: 9.2 mg/dL (ref 8.9–10.3)
Chloride: 104 mmol/L (ref 98–111)
Creatinine, Ser: 0.67 mg/dL (ref 0.44–1.00)
GFR, Estimated: >60 mL/min (ref >60)
Glucose, Bld: 98 mg/dL (ref 70–99)
Potassium: 4.3 mmol/L (ref 3.5–5.1)
Sodium: 141 mmol/L (ref 135–145)

## 2020-10-28 MED ORDER — POLYETHYLENE GLYCOL 3350 17 G PO PACK
17.0000 g | PACK | Freq: Every day | ORAL | Status: DC | PRN
  Administered 2020-10-28: 17 g via ORAL
  Filled 2020-10-28: qty 1

## 2020-10-28 NOTE — Progress Notes (Signed)
Subjective: R hip fx, awaiting hemiarthroplasty Currently no c/o.   Objective: Vital signs in last 24 hours: Temp:  [97.6 F (36.4 C)-98 F (36.7 C)] 97.6 F (36.4 C) (05/29 0410) Pulse Rate:  [59-72] 59 (05/29 0410) Resp:  [14-19] 19 (05/29 0410) BP: (98-137)/(52-62) 112/56 (05/29 0410) SpO2:  [95 %-100 %] 100 % (05/29 0410)  Intake/Output from previous day: 05/28 0701 - 05/29 0700 In: 800 [I.V.:800] Out: -  Intake/Output this shift: No intake/output data recorded.  Recent Labs    10/26/20 1954 10/27/20 0948 10/28/20 0418  HGB 9.8* 9.9* 9.7*   Recent Labs    10/27/20 0948 10/28/20 0418  WBC 5.7 4.6  RBC 3.28* 3.24*  HCT 31.5* 31.5*  PLT 306 292   Recent Labs    10/27/20 0948 10/28/20 0418  NA 142 141  K 3.7 4.3  CL 105 104  CO2 29 34*  BUN 28* 24*  CREATININE 0.68 0.67  GLUCOSE 97 98  CALCIUM 9.1 9.2   Recent Labs    10/26/20 1954  INR 1.0    Neurologically intact ABD soft Neurovascular intact Sensation intact distally Intact pulses distally Dorsiflexion/Plantar flexion intact No cellulitis present Compartment soft R leg shortened and ER No sign of DVT   Assessment/Plan: R hip fx, awaiting hemiarthroplasty hopefully tomorrow with Dr. Charlann Boxer NPO p MN to prepare for surgery tomorrow Hold anticoagulation appropriately    Teresa Richard 10/28/2020, 8:31 AM

## 2020-10-28 NOTE — Progress Notes (Signed)
PROGRESS NOTE        PATIENT DETAILS Name: Teresa Richard Age: 80 y.o. Sex: female Date of Birth: 1941-03-29 Admit Date: 10/26/2020 Admitting Physician Eduard Clos, MD VOZ:[id number], Roderic Palau, MD  Brief Narrative: Patient is a 80 y.o. female with history of dementia, complete heart block-s/p PPM placement, hypothyroidism-presenting with a mechanical fall-found to have right hip fracture.  Significant events: 5/27>> admit for right hip fracture  Significant studies: 5/27>> x-ray right hip: Displaced right femoral neck fracture. 5/27>> x-ray right knee: No fracture  Antimicrobial therapy: None  Microbiology data: 5/27>> influenza/COVID PCR: Negative  Procedures : None  Consults: Ortho  DVT Prophylaxis : SCDs Start: 10/26/20 2130   Subjective: Pleasantly confused-no major issues overnight.  Assessment/Plan: Right hip fracture: Following a mechanical fall-orthopedics following-with plans for ORIF on 5/30.  History of complete heart block-s/p PPM placement  Hypothyroidism: Continue Synthroid  History of dementia: At risk for delirium-maintain delirium precautions.  On Depakote/Seroquel.  Depression/anxiety: Continue Lexapro/BuSpar-and as needed Xanax  Normocytic anemia: Stable for follow-up-on iron supplementation  Vitamin B12 deficiency: On supplementation  Diet: Diet Order            Diet NPO time specified  Diet effective midnight           Diet regular Room service appropriate? Yes; Fluid consistency: Thin  Diet effective now                  Code Status:  DNR  Family Communication: Daughter-[name]-[phone] at bedside on 5/29  Disposition Plan: Status is: Inpatient  Remains inpatient appropriate because:Inpatient level of care appropriate due to severity of illness   Dispo: The patient is from: SNF              Anticipated d/c is to: SNF              Patient currently is not medically stable to d/c.    Difficult to place patient No   Barriers to Discharge: Right hip fracture-awaiting operative repair  Antimicrobial agents: Anti-infectives (From admission, onward)   None       Time spent: 25- minutes-Greater than 50% of this time was spent in counseling, explanation of diagnosis, planning of further management, and coordination of care.  MEDICATIONS: Scheduled Meds: . busPIRone  10 mg Oral BID  . busPIRone  7.5 mg Oral Q1500  . diclofenac Sodium  2 g Topical QID  . divalproex  125 mg Oral BID  . escitalopram  10 mg Oral Daily  . ferrous sulfate  325 mg Oral Q breakfast  . levothyroxine  75 mcg Oral Q0600  . melatonin  10 mg Oral QHS  . Pimavanserin Tartrate  34 mg Oral Daily  . QUEtiapine  100 mg Oral QHS  . QUEtiapine  75 mg Oral q AM  . vitamin B-12  500 mcg Oral Daily   Continuous Infusions: PRN Meds:.acetaminophen, ALPRAZolam, fentaNYL (SUBLIMAZE) injection, ondansetron (ZOFRAN) IV, polyethylene glycol   PHYSICAL EXAM: Vital signs: Vitals:   10/27/20 1643 10/27/20 1959 10/28/20 0017 10/28/20 0410  BP: 113/62 (!) 118/59 (!) 105/52 (!) 112/56  Pulse: 72 64 61 (!) 59  Resp:   14 19  Temp: 97.8 F (36.6 C) 98 F (36.7 C) 97.6 F (36.4 C) 97.6 F (36.4 C)  TempSrc: Oral Axillary Oral Oral  SpO2: 95% 95% 96% 100%  Weight:  Height:       Filed Weights   10/26/20 2059  Weight: 59 kg   Body mass index is 25.4 kg/m.   Gen Exam: Pleasantly confused HEENT:atraumatic, normocephalic Chest: B/L clear to auscultation anteriorly CVS:S1S2 regular Abdomen:soft non tender, non distended Extremities:no edema Neurology: Non focal Skin: no rash  I have personally reviewed following labs and imaging studies  LABORATORY DATA: CBC: Recent Labs  Lab 10/26/20 1954 10/27/20 0948 10/28/20 0418  WBC 5.5 5.7 4.6  NEUTROABS 3.6  --   --   HGB 9.8* 9.9* 9.7*  HCT 31.5* 31.5* 31.5*  MCV 97.5 96.0 97.2  PLT 328 306 292    Basic Metabolic Panel: Recent Labs   Lab 10/26/20 1954 10/27/20 0948 10/28/20 0418  NA 143 142 141  K 4.4 3.7 4.3  CL 102 105 104  CO2 32 29 34*  GLUCOSE 117* 97 98  BUN 31* 28* 24*  CREATININE 0.75 0.68 0.67  CALCIUM 9.2 9.1 9.2    GFR: Estimated Creatinine Clearance: 45.1 mL/min (by C-G formula based on SCr of 0.67 mg/dL).  Liver Function Tests: No results for input(s): AST, ALT, ALKPHOS, BILITOT, PROT, ALBUMIN in the last 168 hours. No results for input(s): LIPASE, AMYLASE in the last 168 hours. No results for input(s): AMMONIA in the last 168 hours.  Coagulation Profile: Recent Labs  Lab 10/26/20 1954  INR 1.0    Cardiac Enzymes: No results for input(s): CKTOTAL, CKMB, CKMBINDEX, TROPONINI in the last 168 hours.  BNP (last 3 results) No results for input(s): PROBNP in the last 8760 hours.  Lipid Profile: No results for input(s): CHOL, HDL, LDLCALC, TRIG, CHOLHDL, LDLDIRECT in the last 72 hours.  Thyroid Function Tests: No results for input(s): TSH, T4TOTAL, FREET4, T3FREE, THYROIDAB in the last 72 hours.  Anemia Panel: No results for input(s): VITAMINB12, FOLATE, FERRITIN, TIBC, IRON, RETICCTPCT in the last 72 hours.  Urine analysis:    Component Value Date/Time   COLORURINE YELLOW (A) 05/08/2020 0502   APPEARANCEUR HAZY (A) 05/08/2020 0502   LABSPEC 1.024 05/08/2020 0502   PHURINE 5.0 05/08/2020 0502   GLUCOSEU NEGATIVE 05/08/2020 0502   HGBUR NEGATIVE 05/08/2020 0502   BILIRUBINUR NEGATIVE 05/08/2020 0502   KETONESUR NEGATIVE 05/08/2020 0502   PROTEINUR NEGATIVE 05/08/2020 0502   NITRITE NEGATIVE 05/08/2020 0502   LEUKOCYTESUR NEGATIVE 05/08/2020 0502    Sepsis Labs: Lactic Acid, Venous No results found for: LATICACIDVEN  MICROBIOLOGY: Recent Results (from the past 240 hour(s))  Resp Panel by RT-PCR (Flu A&B, Covid) Nasopharyngeal Swab     Status: None   Collection Time: 10/26/20  7:26 PM   Specimen: Nasopharyngeal Swab; Nasopharyngeal(NP) swabs in vial transport medium   Result Value Ref Range Status   SARS Coronavirus 2 by RT PCR NEGATIVE NEGATIVE Final    Comment: (NOTE) SARS-CoV-2 target nucleic acids are NOT DETECTED.  The SARS-CoV-2 RNA is generally detectable in upper respiratory specimens during the acute phase of infection. The lowest concentration of SARS-CoV-2 viral copies this assay can detect is 138 copies/mL. A negative result does not preclude SARS-Cov-2 infection and should not be used as the sole basis for treatment or other patient management decisions. A negative result may occur with  improper specimen collection/handling, submission of specimen other than nasopharyngeal swab, presence of viral mutation(s) within the areas targeted by this assay, and inadequate number of viral copies(<138 copies/mL). A negative result must be combined with clinical observations, patient history, and epidemiological information. The expected result is Negative.  Fact Sheet for Patients:  BloggerCourse.com  Fact Sheet for Healthcare Providers:  SeriousBroker.it  This test is no t yet approved or cleared by the [place] FDA and  has been authorized for detection and/or diagnosis of SARS-CoV-2 by FDA under an Emergency Use Authorization (EUA). This EUA will remain  in effect (meaning this test can be used) for the duration of the COVID-19 declaration under Section 564(b)(1) of the Act, 21 U.S.C.section 360bbb-3(b)(1), unless the authorization is terminated  or revoked sooner.       Influenza A by PCR NEGATIVE NEGATIVE Final   Influenza B by PCR NEGATIVE NEGATIVE Final    Comment: (NOTE) The Xpert Xpress SARS-CoV-2/FLU/RSV plus assay is intended as an aid in the diagnosis of influenza from Nasopharyngeal swab specimens and should not be used as a sole basis for treatment. Nasal washings and aspirates are unacceptable for Xpert Xpress SARS-CoV-2/FLU/RSV testing.  Fact Sheet for  Patients: BloggerCourse.com  Fact Sheet for Healthcare Providers: SeriousBroker.it  This test is not yet approved or cleared by the [place] FDA and has been authorized for detection and/or diagnosis of SARS-CoV-2 by FDA under an Emergency Use Authorization (EUA). This EUA will remain in effect (meaning this test can be used) for the duration of the COVID-19 declaration under Section 564(b)(1) of the Act, 21 U.S.C. section 360bbb-3(b)(1), unless the authorization is terminated or revoked.  Performed at [facility], [address] [place], [place] [address]     RADIOLOGY STUDIES/RESULTS: DG Knee 1-2 Views Right  Result Date: 10/26/2020 CLINICAL DATA:  Unwitnessed fall. EXAM: RIGHT KNEE - 1-2 VIEW COMPARISON:  None. FINDINGS: Bones diffusely under mineralized. Prior lateral plate and multi screw fixation of the proximal tibia. No evidence of acute fracture or periprosthetic lucency. Moderate tricompartmental osteoarthritis. No significant knee joint effusion. IMPRESSION: 1. No acute fracture or dislocation of the right knee. 2. Prior proximal tibial ORIF without complication. 3. Moderate tricompartmental osteoarthritis. Electronically Signed   By: Narda Rutherford M.D.   On: 10/26/2020 20:34   DG Hip Unilat W or Wo Pelvis 2-3 Views Right  Result Date: 10/26/2020 CLINICAL DATA:  Post fall with recent hip fracture on outpatient imaging. EXAM: DG HIP (WITH OR WITHOUT PELVIS) 2-3V RIGHT COMPARISON:  Hip radiograph 10/10/2020 FINDINGS: Right femoral neck fracture which is displaced. There is proximal migration of the femoral shaft. The femoral head is well seated in the acetabulum. Pubic rami are intact. Bones are diffusely under mineralized. Pubic symphysis and sacroiliac joints are congruent. IMPRESSION: Displaced right femoral neck fracture. Electronically Signed   By: Narda Rutherford M.D.   On: 10/26/2020 20:31     LOS: 2 days    Jeoffrey Massed, MD  [facility]    To contact the attending provider between 7A-7P or the covering provider during after hours 7P-7A, please log into the web site [email or web address] and access using universal [place] password for that web site. If you do not have the password, please call the hospital operator.  10/28/2020, 2:56 PM

## 2020-10-28 NOTE — Plan of Care (Signed)

## 2020-10-28 NOTE — Plan of Care (Addendum)
Patient refused morning meds. Patient spitting applesauce out.     Problem: Education: Goal: Knowledge of General Education information will improve Description: Including pain rating scale, medication(s)/side effects and non-pharmacologic comfort measures Outcome: Progressing   Problem: Activity: Goal: Risk for activity intolerance will decrease Outcome: Progressing   Problem: Pain Managment: Goal: General experience of comfort will improve Outcome: Progressing   Problem: Safety: Goal: Ability to remain free from injury will improve Outcome: Progressing   Problem: Skin Integrity: Goal: Risk for impaired skin integrity will decrease Outcome: Progressing

## 2020-10-29 DIAGNOSIS — S72001A Fracture of unspecified part of neck of right femur, initial encounter for closed fracture: Secondary | ICD-10-CM | POA: Diagnosis not present

## 2020-10-29 DIAGNOSIS — W19XXXA Unspecified fall, initial encounter: Secondary | ICD-10-CM | POA: Diagnosis not present

## 2020-10-29 DIAGNOSIS — D519 Vitamin B12 deficiency anemia, unspecified: Secondary | ICD-10-CM | POA: Diagnosis not present

## 2020-10-29 DIAGNOSIS — E079 Disorder of thyroid, unspecified: Secondary | ICD-10-CM | POA: Diagnosis not present

## 2020-10-29 LAB — SURGICAL PCR SCREEN
MRSA, PCR: NEGATIVE
Staphylococcus aureus: NEGATIVE

## 2020-10-29 NOTE — Progress Notes (Signed)
Patient ID: Teresa Richard, female   DOB: 31-Jul-1940, 80 y.o.   MRN: [id number] Subjective:  Patient with significant dementia Sleeping this am with both hips and knees in a flexed position     No reported events  Objective:   VITALS:   Vitals:   10/28/20 2000 10/29/20 0500  BP: (!) 104/49 (!) 108/53  Pulse: 73 75  Resp: 18 17  Temp: 97.8 F (36.6 C) 98 F (36.7 C)  SpO2: 99% 98%   Exam: As noted above  LABS Recent Labs    10/26/20 1954 10/27/20 0948 10/28/20 0418  HGB 9.8* 9.9* 9.7*  HCT 31.5* 31.5* 31.5*  WBC 5.5 5.7 4.6  PLT 328 306 292    Recent Labs    10/26/20 1954 10/27/20 0948 10/28/20 0418  NA 143 142 141  K 4.4 3.7 4.3  BUN 31* 28* 24*  CREATININE 0.75 0.68 0.67  GLUCOSE 117* 97 98    Recent Labs    10/26/20 1954  INR 1.0     Assessment/Plan: Right displaced femoral neck fracture age uncertain    Plan: She will need operative treatment in form of hemiarthroplasty to allow for pain control and mobility Procedure will likely be Tuesday or Wednesday Regular diet until surgery can be scheduled

## 2020-10-29 NOTE — Plan of Care (Signed)

## 2020-10-29 NOTE — Plan of Care (Signed)

## 2020-10-29 NOTE — Progress Notes (Signed)
PROGRESS NOTE        PATIENT DETAILS Name: Teresa Richard Age: 80 y.o. Sex: female Date of Birth: [birth year] Admit Date: 10/26/2020 Admitting Physician Eduard Clos, MD EXN:[id number], Roderic Palau, MD  Brief Narrative: Patient is a 80 y.o. female with history of dementia, complete heart block-s/p PPM placement, hypothyroidism-presenting with a mechanical fall-found to have right hip fracture.  Significant events: 5/27>> admit for right hip fracture  Significant studies: 5/27>> x-ray right hip: Displaced right femoral neck fracture. 5/27>> x-ray right knee: No fracture  Antimicrobial therapy: None  Microbiology data: 5/27>> influenza/COVID PCR: Negative  Procedures : None  Consults: Ortho  DVT Prophylaxis : SCDs Start: 10/26/20 2130   Subjective: Lying comfortably in bed-no major issues overnight.  She is pleasantly confused.  Assessment/Plan: Right hip fracture: Following a mechanical fall-orthopedics following-seen by Dr. Charlann Boxer today-operative repair tentatively scheduled either on 5/31 or 6/1.  History of complete heart block-s/p PPM placement  Hypothyroidism: Continue Synthroid  History of dementia: At risk for delirium-maintain delirium precautions.  On Depakote/Seroquel.  Depression/anxiety: Continue Lexapro/BuSpar-and as needed Xanax  Normocytic anemia: Stable for follow-up-on iron supplementation  Vitamin B12 deficiency: On supplementation  Diet: Diet Order            Diet regular Room service appropriate? Yes; Fluid consistency: Thin  Diet effective now                  Code Status:  DNR  Family Communication: Daughter-[name]-[phone] at bedside on 5/29  Disposition Plan: Status is: Inpatient  Remains inpatient appropriate because:Inpatient level of care appropriate due to severity of illness   Dispo: The patient is from: SNF              Anticipated d/c is to: SNF              Patient currently is not  medically stable to d/c.   Difficult to place patient No   Barriers to Discharge: Right hip fracture-awaiting operative repair  Antimicrobial agents: Anti-infectives (From admission, onward)   None       Time spent: 25- minutes-Greater than 50% of this time was spent in counseling, explanation of diagnosis, planning of further management, and coordination of care.  MEDICATIONS: Scheduled Meds: . busPIRone  10 mg Oral BID  . busPIRone  7.5 mg Oral Q1500  . diclofenac Sodium  2 g Topical QID  . divalproex  125 mg Oral BID  . escitalopram  10 mg Oral Daily  . ferrous sulfate  325 mg Oral Q breakfast  . levothyroxine  75 mcg Oral Q0600  . melatonin  10 mg Oral QHS  . Pimavanserin Tartrate  34 mg Oral Daily  . QUEtiapine  100 mg Oral QHS  . QUEtiapine  75 mg Oral q AM  . vitamin B-12  500 mcg Oral Daily   Continuous Infusions: PRN Meds:.acetaminophen, ALPRAZolam, fentaNYL (SUBLIMAZE) injection, ondansetron (ZOFRAN) IV, polyethylene glycol   PHYSICAL EXAM: Vital signs: Vitals:   10/28/20 0700 10/28/20 2000 10/29/20 0500 10/29/20 0748  BP: 115/65 (!) 104/49 (!) 108/53 (!) 114/52  Pulse: 65 73 75 61  Resp:  18 17 14   Temp: 98 F (36.7 C) 97.8 F (36.6 C) 98 F (36.7 C) 97.6 F (36.4 C)  TempSrc: Oral Oral Oral Oral  SpO2: 100% 99% 98% 99%  Weight:      Height:  Filed Weights   10/26/20 2059  Weight: 59 kg   Body mass index is 25.4 kg/m.   Gen Exam: Pleasantly confused. HEENT:atraumatic, normocephalic Chest: B/L clear to auscultation anteriorly CVS:S1S2 regular Abdomen:soft non tender, non distended Extremities:no edema Neurology: Non focal Skin: no rash  I have personally reviewed following labs and imaging studies  LABORATORY DATA: CBC: Recent Labs  Lab 10/26/20 1954 10/27/20 0948 10/28/20 0418  WBC 5.5 5.7 4.6  NEUTROABS 3.6  --   --   HGB 9.8* 9.9* 9.7*  HCT 31.5* 31.5* 31.5*  MCV 97.5 96.0 97.2  PLT 328 306 292    Basic Metabolic  Panel: Recent Labs  Lab 10/26/20 1954 10/27/20 0948 10/28/20 0418  NA 143 142 141  K 4.4 3.7 4.3  CL 102 105 104  CO2 32 29 34*  GLUCOSE 117* 97 98  BUN 31* 28* 24*  CREATININE 0.75 0.68 0.67  CALCIUM 9.2 9.1 9.2    GFR: Estimated Creatinine Clearance: 45.1 mL/min (by C-G formula based on SCr of 0.67 mg/dL).  Liver Function Tests: No results for input(s): AST, ALT, ALKPHOS, BILITOT, PROT, ALBUMIN in the last 168 hours. No results for input(s): LIPASE, AMYLASE in the last 168 hours. No results for input(s): AMMONIA in the last 168 hours.  Coagulation Profile: Recent Labs  Lab 10/26/20 1954  INR 1.0    Cardiac Enzymes: No results for input(s): CKTOTAL, CKMB, CKMBINDEX, TROPONINI in the last 168 hours.  BNP (last 3 results) No results for input(s): PROBNP in the last 8760 hours.  Lipid Profile: No results for input(s): CHOL, HDL, LDLCALC, TRIG, CHOLHDL, LDLDIRECT in the last 72 hours.  Thyroid Function Tests: No results for input(s): TSH, T4TOTAL, FREET4, T3FREE, THYROIDAB in the last 72 hours.  Anemia Panel: No results for input(s): VITAMINB12, FOLATE, FERRITIN, TIBC, IRON, RETICCTPCT in the last 72 hours.  Urine analysis:    Component Value Date/Time   COLORURINE YELLOW (A) 05/08/2020 0502   APPEARANCEUR HAZY (A) 05/08/2020 0502   LABSPEC 1.024 05/08/2020 0502   PHURINE 5.0 05/08/2020 0502   GLUCOSEU NEGATIVE 05/08/2020 0502   HGBUR NEGATIVE 05/08/2020 0502   BILIRUBINUR NEGATIVE 05/08/2020 0502   KETONESUR NEGATIVE 05/08/2020 0502   PROTEINUR NEGATIVE 05/08/2020 0502   NITRITE NEGATIVE 05/08/2020 0502   LEUKOCYTESUR NEGATIVE 05/08/2020 0502    Sepsis Labs: Lactic Acid, Venous No results found for: LATICACIDVEN  MICROBIOLOGY: Recent Results (from the past 240 hour(s))  Resp Panel by RT-PCR (Flu A&B, Covid) Nasopharyngeal Swab     Status: None   Collection Time: 10/26/20  7:26 PM   Specimen: Nasopharyngeal Swab; Nasopharyngeal(NP) swabs in vial  transport medium  Result Value Ref Range Status   SARS Coronavirus 2 by RT PCR NEGATIVE NEGATIVE Final    Comment: (NOTE) SARS-CoV-2 target nucleic acids are NOT DETECTED.  The SARS-CoV-2 RNA is generally detectable in upper respiratory specimens during the acute phase of infection. The lowest concentration of SARS-CoV-2 viral copies this assay can detect is 138 copies/mL. A negative result does not preclude SARS-Cov-2 infection and should not be used as the sole basis for treatment or other patient management decisions. A negative result may occur with  improper specimen collection/handling, submission of specimen other than nasopharyngeal swab, presence of viral mutation(s) within the areas targeted by this assay, and inadequate number of viral copies(<138 copies/mL). A negative result must be combined with clinical observations, patient history, and epidemiological information. The expected result is Negative.  Fact Sheet for Patients:  BloggerCourse.com  Fact Sheet for Healthcare Providers:  SeriousBroker.it  This test is no t yet approved or cleared by the [place] FDA and  has been authorized for detection and/or diagnosis of SARS-CoV-2 by FDA under an Emergency Use Authorization (EUA). This EUA will remain  in effect (meaning this test can be used) for the duration of the COVID-19 declaration under Section 564(b)(1) of the Act, 21 U.S.C.section 360bbb-3(b)(1), unless the authorization is terminated  or revoked sooner.       Influenza A by PCR NEGATIVE NEGATIVE Final   Influenza B by PCR NEGATIVE NEGATIVE Final    Comment: (NOTE) The Xpert Xpress SARS-CoV-2/FLU/RSV plus assay is intended as an aid in the diagnosis of influenza from Nasopharyngeal swab specimens and should not be used as a sole basis for treatment. Nasal washings and aspirates are unacceptable for Xpert Xpress SARS-CoV-2/FLU/RSV testing.  Fact  Sheet for Patients: BloggerCourse.com  Fact Sheet for Healthcare Providers: SeriousBroker.it  This test is not yet approved or cleared by the [place] FDA and has been authorized for detection and/or diagnosis of SARS-CoV-2 by FDA under an Emergency Use Authorization (EUA). This EUA will remain in effect (meaning this test can be used) for the duration of the COVID-19 declaration under Section 564(b)(1) of the Act, 21 U.S.C. section 360bbb-3(b)(1), unless the authorization is terminated or revoked.  Performed at [facility], [address] [place], [place] [address]   Surgical pcr screen     Status: None   Collection Time: 10/29/20  7:43 AM   Specimen: Nasal Mucosa; Nasal Swab  Result Value Ref Range Status   MRSA, PCR NEGATIVE NEGATIVE Final   Staphylococcus aureus NEGATIVE NEGATIVE Final    Comment: (NOTE) The Xpert SA Assay (FDA approved for NASAL specimens in patients 2 years of age and older), is one component of a comprehensive surveillance program. It is not intended to diagnose infection nor to guide or monitor treatment. Performed at [facility], [address] [place], [place] [address]     RADIOLOGY STUDIES/RESULTS: No results found.   LOS: 3 days   Jeoffrey Massed, MD  [facility]    To contact the attending provider between 7A-7P or the covering provider during after hours 7P-7A, please log into the web site [email or web address] and access using universal [place] password for that web site. If you do not have the password, please call the hospital operator.  10/29/2020, 11:52 AM

## 2020-10-30 ENCOUNTER — Encounter (HOSPITAL_COMMUNITY): Payer: Self-pay | Admitting: Internal Medicine

## 2020-10-30 DIAGNOSIS — D519 Vitamin B12 deficiency anemia, unspecified: Secondary | ICD-10-CM | POA: Diagnosis not present

## 2020-10-30 DIAGNOSIS — E079 Disorder of thyroid, unspecified: Secondary | ICD-10-CM | POA: Diagnosis not present

## 2020-10-30 DIAGNOSIS — S72001A Fracture of unspecified part of neck of right femur, initial encounter for closed fracture: Secondary | ICD-10-CM | POA: Diagnosis not present

## 2020-10-30 DIAGNOSIS — W19XXXA Unspecified fall, initial encounter: Secondary | ICD-10-CM | POA: Diagnosis not present

## 2020-10-30 DIAGNOSIS — E43 Unspecified severe protein-calorie malnutrition: Secondary | ICD-10-CM | POA: Insufficient documentation

## 2020-10-30 MED ORDER — CHLORHEXIDINE GLUCONATE 4 % EX LIQD
60.0000 mL | Freq: Once | CUTANEOUS | Status: AC
  Administered 2020-10-30: 4 via TOPICAL
  Filled 2020-10-30: qty 15

## 2020-10-30 MED ORDER — CEFAZOLIN SODIUM-DEXTROSE 2-4 GM/100ML-% IV SOLN
2.0000 g | INTRAVENOUS | Status: DC
  Filled 2020-10-30: qty 100

## 2020-10-30 MED ORDER — ADULT MULTIVITAMIN W/MINERALS CH
1.0000 | ORAL_TABLET | Freq: Every day | ORAL | Status: DC
  Administered 2020-11-01 – 2020-11-06 (×6): 1 via ORAL
  Filled 2020-10-30 (×6): qty 1

## 2020-10-30 MED ORDER — TRANEXAMIC ACID-NACL 1000-0.7 MG/100ML-% IV SOLN: 1000.0000 mg | INTRAVENOUS | Status: DC

## 2020-10-30 MED ORDER — POVIDONE-IODINE 10 % EX SWAB: 2.0000 "application " | Freq: Once | CUTANEOUS | Status: DC

## 2020-10-30 NOTE — Plan of Care (Signed)

## 2020-10-30 NOTE — Progress Notes (Signed)
PROGRESS NOTE        PATIENT DETAILS Name: Teresa Richard Age: 80 y.o. Sex: female Date of Birth: [birth year] Admit Date: 10/26/2020 Admitting Physician Eduard Clos, MD [facility]:ENIDP, Roderic Palau, MD  Brief Narrative: Patient is a 80 y.o. female with history of dementia, complete heart block-s/p PPM placement, hypothyroidism-presenting with a mechanical fall-found to have right hip fracture.  Significant events: 5/27>> admit for right hip fracture  Significant studies: 5/27>> x-ray right hip: Displaced right femoral neck fracture. 5/27>> x-ray right knee: No fracture  Antimicrobial therapy: None  Microbiology data: 5/27>> influenza/COVID PCR: Negative  Procedures : None  Consults: Ortho  DVT Prophylaxis : SCDs Start: 10/26/20 2130   Subjective: Sleeping comfortably when I walked in-awoke easily-  Assessment/Plan: Right hip fracture: Following a mechanical fall-orthopedics following-tentative plan for operative repair on 5/31.  History of complete heart block-s/p PPM placement  Hypothyroidism: Synthroid  History of dementia: At risk for delirium-maintain delirium precautions.  On Depakote/Seroquel.  Depression/anxiety: Continue Lexapro/BuSpar-and as needed Xanax  Normocytic anemia: Stable for follow-up-on iron supplementation  Vitamin B12 deficiency: On supplementation  Diet: Diet Order            Diet NPO time specified  Diet effective ____                  Code Status:  DNR  Family Communication: Daughter-[name]-[phone] at bedside on 5/29  Disposition Plan: Status is: Inpatient  Remains inpatient appropriate because:Inpatient level of care appropriate due to severity of illness   Dispo: The patient is from: SNF              Anticipated d/c is to: SNF              Patient currently is not medically stable to d/c.   Difficult to place patient No   Barriers to Discharge: Right hip fracture-awaiting  operative repair  Antimicrobial agents: Anti-infectives (From admission, onward)   Start     Dose/Rate Route Frequency Ordered Stop   10/30/20 0700  ceFAZolin (ANCEF) IVPB 2g/100 mL premix        2 g 200 mL/hr over 30 Minutes Intravenous On call to O.R. 10/30/20 8242 10/31/20 0559       Time spent: 25- minutes-Greater than 50% of this time was spent in counseling, explanation of diagnosis, planning of further management, and coordination of care.  MEDICATIONS: Scheduled Meds: . busPIRone  10 mg Oral BID  . busPIRone  7.5 mg Oral Q1500  . diclofenac Sodium  2 g Topical QID  . divalproex  125 mg Oral BID  . escitalopram  10 mg Oral Daily  . ferrous sulfate  325 mg Oral Q breakfast  . levothyroxine  75 mcg Oral Q0600  . melatonin  10 mg Oral QHS  . [START ON 10/31/2020] multivitamin with minerals  1 tablet Oral Daily  . Pimavanserin Tartrate  34 mg Oral Daily  . povidone-iodine  2 application Topical Once  . QUEtiapine  100 mg Oral QHS  . QUEtiapine  75 mg Oral q AM  . vitamin B-12  500 mcg Oral Daily   Continuous Infusions: .  ceFAZolin (ANCEF) IV    . tranexamic acid     PRN Meds:.acetaminophen, ALPRAZolam, fentaNYL (SUBLIMAZE) injection, ondansetron (ZOFRAN) IV, polyethylene glycol   PHYSICAL EXAM: Vital signs: Vitals:   10/29/20 1517 10/29/20 1950 10/30/20  0237 10/30/20 0812  BP: 123/62 (!) 110/56 134/60 130/87  Pulse: 84 75 78 68  Resp: 16 17 16 16   Temp: 97.8 F (36.6 C) 98.6 F (37 C) 98.3 F (36.8 C) 97.7 F (36.5 C)  TempSrc: Oral Oral Oral Oral  SpO2: 100% 100% (!) 82% 100%  Weight:      Height:       Filed Weights   10/26/20 2059 10/29/20 1200  Weight: 59 kg 47.1 kg   Body mass index is 20.28 kg/m.   Gen Exam: Sleeping-but easily awakes-pleasantly confused. HEENT:atraumatic, normocephalic Chest: B/L clear to auscultation anteriorly CVS:S1S2 regular Abdomen:soft non tender, non distended Extremities:no edema Neurology: Difficult exam-but seems  to be moving all 4 extremities. Skin: no rash  I have personally reviewed following labs and imaging studies  LABORATORY DATA: CBC: Recent Labs  Lab 10/26/20 1954 10/27/20 0948 10/28/20 0418  WBC 5.5 5.7 4.6  NEUTROABS 3.6  --   --   HGB 9.8* 9.9* 9.7*  HCT 31.5* 31.5* 31.5*  MCV 97.5 96.0 97.2  PLT 328 306 292    Basic Metabolic Panel: Recent Labs  Lab 10/26/20 1954 10/27/20 0948 10/28/20 0418  NA 143 142 141  K 4.4 3.7 4.3  CL 102 105 104  CO2 32 29 34*  GLUCOSE 117* 97 98  BUN 31* 28* 24*  CREATININE 0.75 0.68 0.67  CALCIUM 9.2 9.1 9.2    GFR: Estimated Creatinine Clearance: 40.3 mL/min (by C-G formula based on SCr of 0.67 mg/dL).  Liver Function Tests: No results for input(s): AST, ALT, ALKPHOS, BILITOT, PROT, ALBUMIN in the last 168 hours. No results for input(s): LIPASE, AMYLASE in the last 168 hours. No results for input(s): AMMONIA in the last 168 hours.  Coagulation Profile: Recent Labs  Lab 10/26/20 1954  INR 1.0    Cardiac Enzymes: No results for input(s): CKTOTAL, CKMB, CKMBINDEX, TROPONINI in the last 168 hours.  BNP (last 3 results) No results for input(s): PROBNP in the last 8760 hours.  Lipid Profile: No results for input(s): CHOL, HDL, LDLCALC, TRIG, CHOLHDL, LDLDIRECT in the last 72 hours.  Thyroid Function Tests: No results for input(s): TSH, T4TOTAL, FREET4, T3FREE, THYROIDAB in the last 72 hours.  Anemia Panel: No results for input(s): VITAMINB12, FOLATE, FERRITIN, TIBC, IRON, RETICCTPCT in the last 72 hours.  Urine analysis:    Component Value Date/Time   COLORURINE YELLOW (A) 05/08/2020 0502   APPEARANCEUR HAZY (A) 05/08/2020 0502   LABSPEC 1.024 05/08/2020 0502   PHURINE 5.0 05/08/2020 0502   GLUCOSEU NEGATIVE 05/08/2020 0502   HGBUR NEGATIVE 05/08/2020 0502   BILIRUBINUR NEGATIVE 05/08/2020 0502   KETONESUR NEGATIVE 05/08/2020 0502   PROTEINUR NEGATIVE 05/08/2020 0502   NITRITE NEGATIVE 05/08/2020 0502    LEUKOCYTESUR NEGATIVE 05/08/2020 0502    Sepsis Labs: Lactic Acid, Venous No results found for: LATICACIDVEN  MICROBIOLOGY: Recent Results (from the past 240 hour(s))  Resp Panel by RT-PCR (Flu A&B, Covid) Nasopharyngeal Swab     Status: None   Collection Time: 10/26/20  7:26 PM   Specimen: Nasopharyngeal Swab; Nasopharyngeal(NP) swabs in vial transport medium  Result Value Ref Range Status   SARS Coronavirus 2 by RT PCR NEGATIVE NEGATIVE Final    Comment: (NOTE) SARS-CoV-2 target nucleic acids are NOT DETECTED.  The SARS-CoV-2 RNA is generally detectable in upper respiratory specimens during the acute phase of infection. The lowest concentration of SARS-CoV-2 viral copies this assay can detect is 138 copies/mL. A negative result does not preclude  SARS-Cov-2 infection and should not be used as the sole basis for treatment or other patient management decisions. A negative result may occur with  improper specimen collection/handling, submission of specimen other than nasopharyngeal swab, presence of viral mutation(s) within the areas targeted by this assay, and inadequate number of viral copies(<138 copies/mL). A negative result must be combined with clinical observations, patient history, and epidemiological information. The expected result is Negative.  Fact Sheet for Patients:  BloggerCourse.com  Fact Sheet for Healthcare Providers:  SeriousBroker.it  This test is no t yet approved or cleared by the [place] FDA and  has been authorized for detection and/or diagnosis of SARS-CoV-2 by FDA under an Emergency Use Authorization (EUA). This EUA will remain  in effect (meaning this test can be used) for the duration of the COVID-19 declaration under Section 564(b)(1) of the Act, 21 U.S.C.section 360bbb-3(b)(1), unless the authorization is terminated  or revoked sooner.       Influenza A by PCR NEGATIVE NEGATIVE Final    Influenza B by PCR NEGATIVE NEGATIVE Final    Comment: (NOTE) The Xpert Xpress SARS-CoV-2/FLU/RSV plus assay is intended as an aid in the diagnosis of influenza from Nasopharyngeal swab specimens and should not be used as a sole basis for treatment. Nasal washings and aspirates are unacceptable for Xpert Xpress SARS-CoV-2/FLU/RSV testing.  Fact Sheet for Patients: BloggerCourse.com  Fact Sheet for Healthcare Providers: SeriousBroker.it  This test is not yet approved or cleared by the [place] FDA and has been authorized for detection and/or diagnosis of SARS-CoV-2 by FDA under an Emergency Use Authorization (EUA). This EUA will remain in effect (meaning this test can be used) for the duration of the COVID-19 declaration under Section 564(b)(1) of the Act, 21 U.S.C. section 360bbb-3(b)(1), unless the authorization is terminated or revoked.  Performed at [facility], [address] [place], [place] [address]   Surgical pcr screen     Status: None   Collection Time: 10/29/20  7:43 AM   Specimen: Nasal Mucosa; Nasal Swab  Result Value Ref Range Status   MRSA, PCR NEGATIVE NEGATIVE Final   Staphylococcus aureus NEGATIVE NEGATIVE Final    Comment: (NOTE) The Xpert SA Assay (FDA approved for NASAL specimens in patients 64 years of age and older), is one component of a comprehensive surveillance program. It is not intended to diagnose infection nor to guide or monitor treatment. Performed at [facility], [address] [place], [place] [address]     RADIOLOGY STUDIES/RESULTS: No results found.   LOS: 4 days   Jeoffrey Massed, MD  [facility]    To contact the attending provider between 7A-7P or the covering provider during after hours 7P-7A, please log into the web site [email or web address] and access using universal [place] password for that web site. If you do not have the password, please call the  hospital operator.  10/30/2020, 2:52 PM

## 2020-10-30 NOTE — Progress Notes (Signed)
Nutrition Follow-up  DOCUMENTATION CODES:   Severe malnutrition in context of chronic illness  INTERVENTION:   -Once diet is advanced:  -Continue Magic cup TID with meals, each supplement provides 290 kcal and 9 grams of protein -MVI with minerals daily  NUTRITION DIAGNOSIS:   Severe Malnutrition related to chronic illness (dementia) as evidenced by moderate fat depletion,severe fat depletion,moderate muscle depletion,severe muscle depletion.  Ongoing  GOAL:   Patient will meet greater than or equal to 90% of their needs  Progressing   MONITOR:   Labs,PO intake,Supplement acceptance,Weight trends  REASON FOR ASSESSMENT:   Consult Hip fracture protocol  ASSESSMENT:   80 year old female who presented to the ED on 5/27 from SNF. Pt had an unwitnessed fall on 5/15 and SNF completed an x-ray on pt showing R femur fracture. PMH of advanced dementia, depression, hypothyroidism, complete heart block s/p pacemaker.  Reviewed I/O's: +420 ml x 24 hours and +1.3 L since admission  Per orthopedics notes, plan for OR today. Pt is currently NPO for procedure.   Pt previously on a regular diet with variable intake; noted meal completion 25-75%. Per discussion with daughter during previous visit, this is baseline for pt.   No family present at time of visit. Pt was very lethargic and did not awaken to answer questions.   Medications reviewed and include ferrous sulfate, melatonin, and vitamin B-12.  Labs reviewed.   NUTRITION - FOCUSED PHYSICAL EXAM:  Flowsheet Row Most Recent Value  Orbital Region Severe depletion  Upper Arm Region Moderate depletion  Thoracic and Lumbar Region Moderate depletion  Buccal Region Severe depletion  Temple Region Severe depletion  Clavicle Bone Region Severe depletion  Clavicle and Acromion Bone Region Severe depletion  Scapular Bone Region Severe depletion  Dorsal Hand Moderate depletion  Patellar Region Moderate depletion  Anterior Thigh  Region Moderate depletion  Posterior Calf Region Moderate depletion  Edema (RD Assessment) Mild  Hair Reviewed  Eyes Reviewed  Mouth Reviewed  Skin Reviewed  Nails Reviewed       Diet Order:   Diet Order            Diet NPO time specified  Diet effective ____                 EDUCATION NEEDS:   Education needs have been addressed  Skin:  Skin Assessment: Reviewed RN Assessment  Last BM:  10/29/20  Height:   Ht Readings from Last 1 Encounters:  10/29/20 5' (1.524 m)    Weight:   Wt Readings from Last 1 Encounters:  10/29/20 47.1 kg   BMI:  Body mass index is 20.28 kg/m.  Estimated Nutritional Needs:   Kcal:  1450-1650  Protein:  65-80 grams  Fluid:  1.4-1.6 L    Levada Schilling, RD, LDN, CDCES Registered Dietitian II Certified Diabetes Care and Education Specialist Please refer to [facility] for RD and/or RD on-call/weekend/after hours pager

## 2020-10-30 NOTE — Progress Notes (Signed)
Patient ID: Teresa Richard, female   DOB: June 16, 1940, 80 y.o.   MRN: [id number]  Patient is likely to go to OR this afternoon under care of Dr. Samson Frederic.  Definitve plan and timing depending on OR availabilty  NPO after 9 am Consent ordered

## 2020-10-30 NOTE — Progress Notes (Signed)
Due to lack of OR availability, surgery will have to be rescheduled for tomorrow. NPO after MN tonight. Hold chemical DVT ppx.

## 2020-10-30 NOTE — Plan of Care (Signed)

## 2020-10-30 NOTE — Progress Notes (Signed)
Pt surgery rescheduled for tomorrow- see Dr. Kathline Magic note. 5N called, nurse caring for pt notified, pt transported via floor bed back to 5N31 with daughter at bedside. Pt daughter is aware. No further changes.

## 2020-10-31 ENCOUNTER — Encounter (HOSPITAL_COMMUNITY)
Admission: EM | Disposition: A | Discharge status: Skilled Nursing Facility | Payer: Self-pay | Source: Skilled Nursing Facility | Attending: Internal Medicine

## 2020-10-31 ENCOUNTER — Encounter (HOSPITAL_COMMUNITY): Payer: Self-pay | Admitting: Internal Medicine

## 2020-10-31 ENCOUNTER — Inpatient Hospital Stay (HOSPITAL_COMMUNITY): Payer: Medicare Other | Admitting: Anesthesiology

## 2020-10-31 ENCOUNTER — Inpatient Hospital Stay (HOSPITAL_COMMUNITY): Payer: Medicare Other

## 2020-10-31 DIAGNOSIS — D519 Vitamin B12 deficiency anemia, unspecified: Secondary | ICD-10-CM | POA: Diagnosis not present

## 2020-10-31 DIAGNOSIS — E079 Disorder of thyroid, unspecified: Secondary | ICD-10-CM | POA: Diagnosis not present

## 2020-10-31 DIAGNOSIS — S72001A Fracture of unspecified part of neck of right femur, initial encounter for closed fracture: Secondary | ICD-10-CM | POA: Diagnosis not present

## 2020-10-31 DIAGNOSIS — W19XXXA Unspecified fall, initial encounter: Secondary | ICD-10-CM | POA: Diagnosis not present

## 2020-10-31 HISTORY — PX: ANTERIOR APPROACH HEMI HIP ARTHROPLASTY: SHX6690

## 2020-10-31 LAB — BASIC METABOLIC PANEL
Anion gap: 6 (ref 5–15)
BUN: 35 mg/dL — ABNORMAL HIGH (ref 8–23)
CO2: 37 mmol/L — ABNORMAL HIGH (ref 22–32)
Calcium: 9.6 mg/dL (ref 8.9–10.3)
Chloride: 102 mmol/L (ref 98–111)
Creatinine, Ser: 0.76 mg/dL (ref 0.44–1.00)
GFR, Estimated: >60 mL/min (ref >60)
Glucose, Bld: 99 mg/dL (ref 70–99)
Potassium: 4.3 mmol/L (ref 3.5–5.1)
Sodium: 145 mmol/L (ref 135–145)

## 2020-10-31 LAB — CBC
HCT: 33.6 % — ABNORMAL LOW (ref 36.0–46.0)
Hemoglobin: 10.4 g/dL — ABNORMAL LOW (ref 12.0–15.0)
MCH: 30.1 pg (ref 26.0–34.0)
MCHC: 31 g/dL (ref 30.0–36.0)
MCV: 97.1 fL (ref 80.0–100.0)
Platelets: 301 10*3/uL (ref 150–400)
RBC: 3.46 MIL/uL — ABNORMAL LOW (ref 3.87–5.11)
RDW: 13.4 % (ref 11.5–15.5)
WBC: 5.4 10*3/uL (ref 4.0–10.5)
nRBC: 0 % (ref 0.0–0.2)

## 2020-10-31 SURGERY — HEMIARTHROPLASTY, HIP, DIRECT ANTERIOR APPROACH, FOR FRACTURE
Anesthesia: General | Site: Hip | Laterality: Right

## 2020-10-31 MED ORDER — ROCURONIUM BROMIDE 10 MG/ML (PF) SYRINGE
PREFILLED_SYRINGE | INTRAVENOUS | Status: AC
  Filled 2020-10-31: qty 10

## 2020-10-31 MED ORDER — METOCLOPRAMIDE HCL 5 MG/ML IJ SOLN: 5.0000 mg | Freq: Three times a day (TID) | INTRAMUSCULAR | Status: DC | PRN

## 2020-10-31 MED ORDER — METHOCARBAMOL 1000 MG/10ML IJ SOLN
500.0000 mg | Freq: Four times a day (QID) | INTRAVENOUS | Status: DC | PRN
  Filled 2020-10-31: qty 5

## 2020-10-31 MED ORDER — LACTATED RINGERS IV SOLN: INTRAVENOUS | Status: DC

## 2020-10-31 MED ORDER — LIDOCAINE 2% (20 MG/ML) 5 ML SYRINGE
INTRAMUSCULAR | Status: AC
  Filled 2020-10-31: qty 5

## 2020-10-31 MED ORDER — FENTANYL CITRATE (PF) 100 MCG/2ML IJ SOLN
25.0000 ug | INTRAMUSCULAR | Status: DC | PRN
  Administered 2020-10-31: 25 ug via INTRAVENOUS

## 2020-10-31 MED ORDER — CEFAZOLIN SODIUM-DEXTROSE 2-4 GM/100ML-% IV SOLN
INTRAVENOUS | Status: AC
  Filled 2020-10-31: qty 100

## 2020-10-31 MED ORDER — SODIUM CHLORIDE (PF) 0.9 % IJ SOLN
INTRAMUSCULAR | Status: DC | PRN
  Administered 2020-10-31: 30 mL

## 2020-10-31 MED ORDER — CHLORHEXIDINE GLUCONATE 0.12 % MT SOLN
OROMUCOSAL | Status: AC
  Administered 2020-10-31: 15 mL via OROMUCOSAL
  Filled 2020-10-31: qty 15

## 2020-10-31 MED ORDER — ONDANSETRON HCL 4 MG/2ML IJ SOLN: 4.0000 mg | Freq: Four times a day (QID) | INTRAMUSCULAR | Status: DC | PRN

## 2020-10-31 MED ORDER — FENTANYL CITRATE (PF) 100 MCG/2ML IJ SOLN
25.0000 ug | INTRAMUSCULAR | Status: DC | PRN
  Administered 2020-11-02 – 2020-11-05 (×2): 25 ug via INTRAVENOUS
  Filled 2020-10-31 (×2): qty 2

## 2020-10-31 MED ORDER — PHENYLEPHRINE 40 MCG/ML (10ML) SYRINGE FOR IV PUSH (FOR BLOOD PRESSURE SUPPORT)
PREFILLED_SYRINGE | INTRAVENOUS | Status: DC | PRN
  Administered 2020-10-31 (×3): 120 ug via INTRAVENOUS

## 2020-10-31 MED ORDER — METOCLOPRAMIDE HCL 5 MG PO TABS: 5.0000 mg | ORAL_TABLET | Freq: Three times a day (TID) | ORAL | Status: DC | PRN

## 2020-10-31 MED ORDER — CEFAZOLIN SODIUM-DEXTROSE 2-3 GM-%(50ML) IV SOLR
INTRAVENOUS | Status: DC | PRN
  Administered 2020-10-31: 2 g via INTRAVENOUS

## 2020-10-31 MED ORDER — METHOCARBAMOL 500 MG PO TABS
500.0000 mg | ORAL_TABLET | Freq: Four times a day (QID) | ORAL | Status: DC | PRN
  Administered 2020-10-31 – 2020-11-01 (×2): 500 mg via ORAL
  Filled 2020-10-31 (×3): qty 1

## 2020-10-31 MED ORDER — EPHEDRINE SULFATE-NACL 50-0.9 MG/10ML-% IV SOSY
PREFILLED_SYRINGE | INTRAVENOUS | Status: DC | PRN
  Administered 2020-10-31 (×2): 10 mg via INTRAVENOUS

## 2020-10-31 MED ORDER — ALBUMIN HUMAN 5 % IV SOLN
12.5000 g | Freq: Once | INTRAVENOUS | Status: AC
  Administered 2020-10-31: 12.5 g via INTRAVENOUS

## 2020-10-31 MED ORDER — SENNA 8.6 MG PO TABS
1.0000 | ORAL_TABLET | Freq: Two times a day (BID) | ORAL | Status: DC
  Administered 2020-11-01 – 2020-11-06 (×11): 8.6 mg via ORAL
  Filled 2020-10-31 (×11): qty 1

## 2020-10-31 MED ORDER — CEFAZOLIN SODIUM-DEXTROSE 2-4 GM/100ML-% IV SOLN
2.0000 g | Freq: Four times a day (QID) | INTRAVENOUS | Status: AC
  Administered 2020-10-31 – 2020-11-01 (×2): 2 g via INTRAVENOUS
  Filled 2020-10-31 (×2): qty 100

## 2020-10-31 MED ORDER — LIDOCAINE 2% (20 MG/ML) 5 ML SYRINGE
INTRAMUSCULAR | Status: DC | PRN
  Administered 2020-10-31: 60 mg via INTRAVENOUS

## 2020-10-31 MED ORDER — ONDANSETRON HCL 4 MG/2ML IJ SOLN
INTRAMUSCULAR | Status: AC
  Filled 2020-10-31: qty 2

## 2020-10-31 MED ORDER — DOCUSATE SODIUM 100 MG PO CAPS
100.0000 mg | ORAL_CAPSULE | Freq: Two times a day (BID) | ORAL | Status: DC
  Administered 2020-11-01: 100 mg via ORAL
  Filled 2020-10-31: qty 1

## 2020-10-31 MED ORDER — ACETAMINOPHEN 10 MG/ML IV SOLN
INTRAVENOUS | Status: DC | PRN
  Administered 2020-10-31: 1000 mg via INTRAVENOUS

## 2020-10-31 MED ORDER — ORAL CARE MOUTH RINSE: 15.0000 mL | Freq: Once | OROMUCOSAL | Status: AC

## 2020-10-31 MED ORDER — TRANEXAMIC ACID-NACL 1000-0.7 MG/100ML-% IV SOLN
1000.0000 mg | Freq: Once | INTRAVENOUS | Status: AC
  Administered 2020-10-31: 1000 mg via INTRAVENOUS
  Filled 2020-10-31: qty 100

## 2020-10-31 MED ORDER — 0.9 % SODIUM CHLORIDE (POUR BTL) OPTIME
TOPICAL | Status: DC | PRN
  Administered 2020-10-31: 1000 mL

## 2020-10-31 MED ORDER — PHENOL 1.4 % MT LIQD
1.0000 | OROMUCOSAL | Status: DC | PRN
Start: 2020-10-31 — End: 2020-11-06

## 2020-10-31 MED ORDER — ONDANSETRON HCL 4 MG/2ML IJ SOLN
INTRAMUSCULAR | Status: DC | PRN
  Administered 2020-10-31: 4 mg via INTRAVENOUS

## 2020-10-31 MED ORDER — ONDANSETRON HCL 4 MG/2ML IJ SOLN: 4.0000 mg | Freq: Once | INTRAMUSCULAR | Status: DC | PRN

## 2020-10-31 MED ORDER — FENTANYL CITRATE (PF) 250 MCG/5ML IJ SOLN
INTRAMUSCULAR | Status: DC | PRN
  Administered 2020-10-31: 25 ug via INTRAVENOUS
  Administered 2020-10-31 (×2): 50 ug via INTRAVENOUS
  Administered 2020-10-31: 25 ug via INTRAVENOUS

## 2020-10-31 MED ORDER — ASPIRIN EC 325 MG PO TBEC
325.0000 mg | DELAYED_RELEASE_TABLET | Freq: Every day | ORAL | Status: DC
  Administered 2020-11-01 – 2020-11-06 (×6): 325 mg via ORAL
  Filled 2020-10-31 (×6): qty 1

## 2020-10-31 MED ORDER — ROCURONIUM BROMIDE 10 MG/ML (PF) SYRINGE
PREFILLED_SYRINGE | INTRAVENOUS | Status: DC | PRN
  Administered 2020-10-31: 50 mg via INTRAVENOUS

## 2020-10-31 MED ORDER — MENTHOL 3 MG MT LOZG: 1.0000 | LOZENGE | OROMUCOSAL | Status: DC | PRN

## 2020-10-31 MED ORDER — PROPOFOL 10 MG/ML IV BOLUS
INTRAVENOUS | Status: DC | PRN
  Administered 2020-10-31: 100 mg via INTRAVENOUS

## 2020-10-31 MED ORDER — FENTANYL CITRATE (PF) 100 MCG/2ML IJ SOLN
INTRAMUSCULAR | Status: AC
  Filled 2020-10-31: qty 2

## 2020-10-31 MED ORDER — SODIUM CHLORIDE 0.9 % IR SOLN
Status: DC | PRN
  Administered 2020-10-31: 1000 mL

## 2020-10-31 MED ORDER — ACETAMINOPHEN 325 MG PO TABS
325.0000 mg | ORAL_TABLET | Freq: Four times a day (QID) | ORAL | Status: DC | PRN
  Administered 2020-10-31 – 2020-11-06 (×8): 650 mg via ORAL
  Filled 2020-10-31 (×8): qty 2

## 2020-10-31 MED ORDER — BUPIVACAINE-EPINEPHRINE 0.5% -1:200000 IJ SOLN
INTRAMUSCULAR | Status: AC
  Filled 2020-10-31: qty 1

## 2020-10-31 MED ORDER — FENTANYL CITRATE (PF) 100 MCG/2ML IJ SOLN: 50.0000 ug | INTRAMUSCULAR | Status: DC | PRN

## 2020-10-31 MED ORDER — ACETAMINOPHEN 10 MG/ML IV SOLN
INTRAVENOUS | Status: AC
  Filled 2020-10-31: qty 100

## 2020-10-31 MED ORDER — KETOROLAC TROMETHAMINE 30 MG/ML IJ SOLN
INTRAMUSCULAR | Status: AC
  Filled 2020-10-31: qty 1

## 2020-10-31 MED ORDER — DEXAMETHASONE SODIUM PHOSPHATE 10 MG/ML IJ SOLN
INTRAMUSCULAR | Status: AC
  Filled 2020-10-31: qty 1

## 2020-10-31 MED ORDER — FENTANYL CITRATE (PF) 250 MCG/5ML IJ SOLN
INTRAMUSCULAR | Status: AC
  Filled 2020-10-31: qty 5

## 2020-10-31 MED ORDER — BUPIVACAINE-EPINEPHRINE (PF) 0.5% -1:200000 IJ SOLN
INTRAMUSCULAR | Status: DC | PRN
  Administered 2020-10-31: 30 mL via PERINEURAL

## 2020-10-31 MED ORDER — ALBUMIN HUMAN 5 % IV SOLN
INTRAVENOUS | Status: AC
  Filled 2020-10-31: qty 250

## 2020-10-31 MED ORDER — SUGAMMADEX SODIUM 200 MG/2ML IV SOLN
INTRAVENOUS | Status: DC | PRN
  Administered 2020-10-31: 100 mg via INTRAVENOUS

## 2020-10-31 MED ORDER — CHLORHEXIDINE GLUCONATE 0.12 % MT SOLN: 15.0000 mL | Freq: Once | OROMUCOSAL | Status: AC

## 2020-10-31 MED ORDER — DEXAMETHASONE SODIUM PHOSPHATE 10 MG/ML IJ SOLN
INTRAMUSCULAR | Status: DC | PRN
  Administered 2020-10-31: 10 mg via INTRAVENOUS

## 2020-10-31 MED ORDER — PHENYLEPHRINE HCL-NACL 10-0.9 MG/250ML-% IV SOLN
INTRAVENOUS | Status: DC | PRN
  Administered 2020-10-31: 25 ug/min via INTRAVENOUS

## 2020-10-31 MED ORDER — ONDANSETRON HCL 4 MG PO TABS: 4.0000 mg | ORAL_TABLET | Freq: Four times a day (QID) | ORAL | Status: DC | PRN

## 2020-10-31 MED ORDER — KETOROLAC TROMETHAMINE 30 MG/ML IJ SOLN
INTRAMUSCULAR | Status: DC | PRN
  Administered 2020-10-31: 30 mg

## 2020-10-31 MED ORDER — PROPOFOL 10 MG/ML IV BOLUS
INTRAVENOUS | Status: AC
  Filled 2020-10-31: qty 80

## 2020-10-31 SURGICAL SUPPLY — 67 items
ADH SKN CLS APL DERMABOND .7 (GAUZE/BANDAGES/DRESSINGS) ×2
ALCOHOL 70% 16 OZ (MISCELLANEOUS) ×2 IMPLANT
AML 19.5 SML 150 LEN 43 ×2 IMPLANT
APL PRP STRL LF DISP 70% ISPRP (MISCELLANEOUS) ×1
BLADE CLIPPER SURG (BLADE) IMPLANT
CHLORAPREP W/TINT 26 (MISCELLANEOUS) ×2 IMPLANT
COVER SURGICAL LIGHT HANDLE (MISCELLANEOUS) ×2 IMPLANT
COVER WAND RF STERILE (DRAPES) ×2 IMPLANT
DERMABOND ADVANCED (GAUZE/BANDAGES/DRESSINGS) ×2
DERMABOND ADVANCED .7 DNX12 (GAUZE/BANDAGES/DRESSINGS) ×2 IMPLANT
DRAPE C-ARM 42X72 X-RAY (DRAPES) ×2 IMPLANT
DRAPE STERI IOBAN 125X83 (DRAPES) ×2 IMPLANT
DRAPE U-SHAPE 47X51 STRL (DRAPES) ×6 IMPLANT
DRSG AQUACEL AG ADV 3.5X10 (GAUZE/BANDAGES/DRESSINGS) ×2 IMPLANT
ELECT BLADE 4.0 EZ CLEAN MEGAD (MISCELLANEOUS) ×2
ELECT PENCIL ROCKER SW 15FT (MISCELLANEOUS) ×2 IMPLANT
ELECT REM PT RETURN 9FT ADLT (ELECTROSURGICAL) ×2
ELECTRODE BLDE 4.0 EZ CLN MEGD (MISCELLANEOUS) ×1 IMPLANT
ELECTRODE REM PT RTRN 9FT ADLT (ELECTROSURGICAL) ×1 IMPLANT
EVACUATOR 1/8 PVC DRAIN (DRAIN) IMPLANT
GLOVE BIO SURGEON STRL SZ8.5 (GLOVE) ×4 IMPLANT
GLOVE BIOGEL M 7.0 STRL (GLOVE) ×2 IMPLANT
GLOVE BIOGEL PI IND STRL 7.5 (GLOVE) ×1 IMPLANT
GLOVE BIOGEL PI IND STRL 8.5 (GLOVE) ×1 IMPLANT
GLOVE BIOGEL PI INDICATOR 7.5 (GLOVE) ×1
GLOVE BIOGEL PI INDICATOR 8.5 (GLOVE) ×1
GOWN STRL REUS W/ TWL LRG LVL3 (GOWN DISPOSABLE) ×2 IMPLANT
GOWN STRL REUS W/ TWL XL LVL3 (GOWN DISPOSABLE) ×1 IMPLANT
GOWN STRL REUS W/TWL 2XL LVL3 (GOWN DISPOSABLE) ×2 IMPLANT
GOWN STRL REUS W/TWL LRG LVL3 (GOWN DISPOSABLE) ×4
GOWN STRL REUS W/TWL XL LVL3 (GOWN DISPOSABLE) ×2
HANDPIECE INTERPULSE COAX TIP (DISPOSABLE) ×2
HEAD FEM UNIPOLAR 48 OD (Hips) ×1 IMPLANT
HIP AML 19.5 SML 150 LEN 43 IMPLANT
HOOD PEEL AWAY FACE SHEILD DIS (HOOD) ×4 IMPLANT
JET LAVAGE IRRISEPT WOUND (IRRIGATION / IRRIGATOR) ×2
KIT BASIN OR (CUSTOM PROCEDURE TRAY) ×2 IMPLANT
KIT TURNOVER KIT B (KITS) ×2 IMPLANT
LAVAGE JET IRRISEPT WOUND (IRRIGATION / IRRIGATOR) ×1 IMPLANT
MANIFOLD NEPTUNE II (INSTRUMENTS) ×2 IMPLANT
MARKER SKIN DUAL TIP RULER LAB (MISCELLANEOUS) ×4 IMPLANT
NDL SPNL 18GX3.5 QUINCKE PK (NEEDLE) ×1 IMPLANT
NEEDLE SPNL 18GX3.5 QUINCKE PK (NEEDLE) ×2 IMPLANT
NS IRRIG 1000ML POUR BTL (IV SOLUTION) ×2 IMPLANT
PACK TOTAL JOINT (CUSTOM PROCEDURE TRAY) ×2 IMPLANT
PACK UNIVERSAL I (CUSTOM PROCEDURE TRAY) ×2 IMPLANT
PAD ARMBOARD 7.5X6 YLW CONV (MISCELLANEOUS) ×4 IMPLANT
SAW OSC TIP CART 19.5X105X1.3 (SAW) ×2 IMPLANT
SEALER BIPOLAR AQUA 6.0 (INSTRUMENTS) IMPLANT
SET HNDPC FAN SPRY TIP SCT (DISPOSABLE) ×1 IMPLANT
SOL PREP POV-IOD 4OZ 10% (MISCELLANEOUS) ×2 IMPLANT
SPACER DEPUY (Hips) ×1 IMPLANT
SUT ETHIBOND NAB CT1 #1 30IN (SUTURE) ×4 IMPLANT
SUT MNCRL AB 3-0 PS2 18 (SUTURE) ×2 IMPLANT
SUT MON AB 2-0 CT1 36 (SUTURE) ×2 IMPLANT
SUT VIC AB 1 CT1 27 (SUTURE) ×2
SUT VIC AB 1 CT1 27XBRD ANBCTR (SUTURE) ×1 IMPLANT
SUT VIC AB 2-0 CT1 27 (SUTURE) ×2
SUT VIC AB 2-0 CT1 TAPERPNT 27 (SUTURE) ×1 IMPLANT
SUT VLOC 180 0 24IN GS25 (SUTURE) ×2 IMPLANT
SYR 50ML LL SCALE MARK (SYRINGE) ×2 IMPLANT
TOWEL GREEN STERILE (TOWEL DISPOSABLE) ×2 IMPLANT
TOWEL GREEN STERILE FF (TOWEL DISPOSABLE) ×2 IMPLANT
TRAY CATH 16FR W/PLASTIC CATH (SET/KITS/TRAYS/PACK) IMPLANT
TRAY FOLEY W/BAG SLVR 16FR (SET/KITS/TRAYS/PACK)
TRAY FOLEY W/BAG SLVR 16FR ST (SET/KITS/TRAYS/PACK) IMPLANT
WATER STERILE IRR 1000ML POUR (IV SOLUTION) ×6 IMPLANT

## 2020-10-31 NOTE — Transfer of Care (Signed)
Immediate Anesthesia Transfer of Care Note  Patient: Teresa Richard  Procedure(s) Performed: HEMIARTHROPLASTY (Right Hip)  Patient Location: PACU  Anesthesia Type:General  Level of Consciousness: awake, drowsy and responds to stimulation  Airway & Oxygen Therapy: Patient Spontanous Breathing  Post-op Assessment: Report given to RN, Post -op Vital signs reviewed and stable and Patient moving all extremities  Post vital signs: Reviewed and stable  Last Vitals:  Vitals Value Taken Time  BP 106/69 10/31/20 1501  Temp    Pulse 100 10/31/20 1502  Resp 16 10/31/20 1502  SpO2 93 % 10/31/20 1502  Vitals shown include unvalidated device data.  Last Pain:  Vitals:   10/31/20 1135  TempSrc: Axillary  PainSc:          Complications: No complications documented.

## 2020-10-31 NOTE — Plan of Care (Signed)

## 2020-10-31 NOTE — Progress Notes (Signed)
Spoke with [employer] rep regarding interrogation of patient's pacemaker. He stated a rep will be to pre op shortly.

## 2020-10-31 NOTE — Anesthesia Preprocedure Evaluation (Addendum)
Anesthesia Evaluation  Patient identified by MRN, date of birth, ID band Patient awake    Reviewed: Allergy & Precautions, NPO status , Patient's Chart, lab work & pertinent test results  History of Anesthesia Complications Negative for: history of anesthetic complications  Airway Mallampati: III  TM Distance: >3 FB Neck ROM: Full    Dental  (+) Dental Advisory Given, Partial Upper   Pulmonary neg pulmonary ROS,    Pulmonary exam normal        Cardiovascular Normal cardiovascular exam+ dysrhythmias (CHB) + pacemaker    '15 SPECT - FINDINGS: Regional wall motion:demonstrateshypokinesis of the Septum.  The overall quality of the study is good.Artifacts noted: no  Left ventricular cavity: normal.  Perfusion Analysis:SPECT images demonstrate small perfusion abnormality of mild intensity is present in the septum region on the stress images.There was no reversibility  '15 TTE - Closest EF: >55% (Estimated)  Contraction: Normal  Tricuspid: MILD TR  Mitral: MILD MR  Aortic: TRIVIAL AR     Neuro/Psych PSYCHIATRIC DISORDERS Anxiety Depression Dementia negative neurological ROS     GI/Hepatic negative GI ROS, Neg liver ROS,   Endo/Other  Hypothyroidism   Renal/GU negative Renal ROS     Musculoskeletal negative musculoskeletal ROS (+)   Abdominal   Peds  Hematology  (+) anemia ,   Anesthesia Other Findings Malnutrition   Reproductive/Obstetrics                            Anesthesia Physical Anesthesia Plan  ASA: III  Anesthesia Plan: General   Post-op Pain Management:    Induction: Intravenous  PONV Risk Score and Plan: 3 and Treatment may vary due to age or medical condition, Ondansetron and Aprepitant  Airway Management Planned: Oral ETT  Additional Equipment: None  Intra-op Plan:   Post-operative Plan: Extubation in OR  Informed Consent: I have reviewed the patients  History and Physical, chart, labs and discussed the procedure including the risks, benefits and alternatives for the proposed anesthesia with the patient or authorized representative who has indicated his/her understanding and acceptance.   Patient has DNR.  Suspend DNR and Discussed DNR with power of attorney.   Dental advisory given and Consent reviewed with POA  Plan Discussed with: CRNA and Anesthesiologist  Anesthesia Plan Comments:        Anesthesia Quick Evaluation

## 2020-10-31 NOTE — Op Note (Signed)
OPERATIVE REPORT  SURGEON: Samson Frederic, MD   ASSISTANT: Barrie Dunker, PA-C  PREOPERATIVE DIAGNOSIS: Displaced subacute Right femoral neck fracture.   POSTOPERATIVE DIAGNOSIS: Displaced subacute Right femoral neck fracture.   PROCEDURE: Right hip hemiarthroplasty, anterior approach.   IMPLANTS: DePuy AML stem, size 18, small stature, with a -3 mm spacer and a 48 mm monopolar head ball.  ANESTHESIA:  General  ANTIBIOTICS: 2g ancef.  ESTIMATED BLOOD LOSS:-150 mL    DRAINS: None.  COMPLICATIONS: None   CONDITION: PACU - hemodynamically stable.   BRIEF CLINICAL NOTE: Teresa Richard is a 80 y.o. female with a displaced Right femoral neck fracture. The patient was admitted to the hospitalist service and underwent perioperative risk stratification and medical optimization. The risks, benefits, and alternatives to hemiarthroplasty were explained, and the patient elected to proceed.  PROCEDURE IN DETAIL: The patient was taken to the operating room and general anesthesia was induced on the hospital bed.  The patient was then positioned on the Hana table.  All bony prominences were well padded.  The hip was prepped and draped in the normal sterile surgical fashion.  A time-out was called verifying side and site of surgery. Antibiotics were given within 60 minutes of beginning the procedure.   Bikini incision was made, and the direct anterior approach to the hip was performed through the Hueter interval.  Lateral femoral circumflex vessels were treated with the Auqumantys. The anterior capsule was exposed and an inverted T capsulotomy was made.  There was no fracture hematoma; synovial fluid was encountered and evacuated. The patient was found to have a comminuted Right subcapital femoral neck fracture.  I freshened the femoral neck cut with a saw.  I removed the femoral neck fragment.  A corkscrew was placed into the head and the head was removed.  This was passed to the back table and was  measured. The pubofemoral ligament was released subperiosteally to the lesser trochanter.   Acetabular exposure was achieved.  I examined the articular cartilage which was intact.  The labrum was intact. A 48 mm trial head was placed and found to have excellent fit.   I then gained femoral exposure taking care to protect the abductors and greater trochanter.  The superior capsule was incised, taking care to stay lateral to the posterior border of the femoral neck. The soft tissues were contracted and very stiff. Uppon using external rotation, extension, and adduction, the greater trochanter avulsed from the proximal femur.   I then created a longitudinal incision proximally, forming a T. A cookie cutter was used to enter the femoral canal, and then the femoral canal finder was used to confirm location.  I then sequentially reamed and broached up to a size 18.0 mm.  Calcar planer was used on the femoral neck remnant.  I paced a trial neck and a 36 + 1.5 head ball. The hip was reduced.  Leg lengths were checked fluoroscopically.  The hip was dislocated and trial components were removed.  I placed the real stem followed by the real spacer and head ball.  A single reduction maneuver was performed and the hip was reduced.  Fluoroscopy was used to confirm component position and leg lengths.  At 90 degrees of external rotation and extension, the hip was stable to an anterior directed force.   The wound was copiously irrigated with Irrisept solution and normal saline using pule lavage.  Marcaine solution was injected into the periarticular soft tissue.  The wound was closed in layers using #  1 Vicryl and V-Loc for the fascia, 2-0 Vicryl for the subcutaneous fat, 2-0 Monocryl for the deep dermal layer, and 2-0 nylon + staples for the skin.  Customizable Prevena dressing was placed and hooked up to suction at 125 mmHg wihout any leak.  The patient was then awakened from anesthesia and transported to the recovery room in  stable condition.  Sponge, needle, and instrument counts were correct at the end of the case x2.  The patient tolerated the procedure well and there were no known complications.  Please note that a surgical assistant was a medical necessity for this procedure to perform it in a safe and expeditious manner. Assistant was necessary to provide appropriate retraction of vital neurovascular structures, to prevent femoral fracture, and to allow for anatomic placement of the prosthesis.  POSTOPERATIVE PLAN: The patient will be readmitted to the hospitalist. Weight bearing as tolerated right lower extremity with a walker. No active abduction for 6 weeks. Begin ASA for DVT prophylaxis. Mobilize out of bed with PT/OT. She will undergo disposition planning. Upon discharge, the house wound VAC will be exchange for a portable Prevena suction unit. She will need to follow up within 7 days of discharge for VAC removal.

## 2020-10-31 NOTE — Discharge Instructions (Signed)
 Dr. Margalit Leece Joint Replacement Specialist [facility] [address] [place], [place] [address] [phone]   TOTAL HIP REPLACEMENT POSTOPERATIVE DIRECTIONS    Hip Rehabilitation, Guidelines Following Surgery   WEIGHT BEARING Weight bearing as tolerated with assist device (walker, cane, etc) as directed, use it as long as suggested by your surgeon or therapist, typically at least 4-6 weeks.  The results of a hip operation are greatly improved after range of motion and muscle strengthening exercises. Follow all safety measures which are given to protect your hip. If any of these exercises cause increased pain or swelling in your joint, decrease the amount until you are comfortable again. Then slowly increase the exercises. Call your caregiver if you have problems or questions.   HOME CARE INSTRUCTIONS  Most of the following instructions are designed to prevent the dislocation of your new hip.  . Remove items at home which could result in a fall. This includes throw rugs or furniture in walking pathways.  . Continue medications as instructed at time of discharge.  You may have some home medications which will be placed on hold until you complete the course of blood thinner medication.  You may start showering once you are discharged home. Do not remove your dressing. . Do not put on socks or shoes without following the instructions of your caregivers.   . Sit on chairs with arms. Use the chair arms to help push yourself up when arising.  . Arrange for the use of a toilet seat elevator so you are not sitting low.   Walk with walker as instructed.  . You may resume a sexual relationship in one month or when given the OK by your caregiver.  . Use walker as long as suggested by your caregivers.  . You may put full weight on your legs and walk as much as is comfortable. . Avoid periods of inactivity such as sitting longer than an hour when not asleep.  This helps prevent blood clots.  . You may return to work once you are cleared by your surgeon.  . Do not drive a car for 6 weeks or until released by your surgeon.  . Do not drive while taking narcotics.  . Wear elastic stockings for two weeks following surgery during the day but you may remove then at night.  . Make sure you keep all of your appointments after your operation with all of your doctors and caregivers. You should call the office at the above phone number and make an appointment for approximately two weeks after the date of your surgery. . Please pick up a stool softener and laxative for home use as long as you are requiring pain medications.  ICE to the affected hip every three hours for 30 minutes at a time and then as needed for pain and swelling. Continue to use ice on the hip for pain and swelling from surgery. You may notice swelling that will progress down to the foot and ankle.  This is normal after surgery.  Elevate the leg when you are not up walking on it.   . It is important for you to complete the blood thinner medication as prescribed by your doctor.  Continue to use the breathing machine which will help keep your temperature down.  It is common for your temperature to cycle up and down following surgery, especially at night when you are not up moving around and exerting yourself.  The breathing machine keeps your   lungs expanded and your temperature down.  RANGE OF MOTION AND STRENGTHENING EXERCISES  These exercises are designed to help you keep full movement of your hip joint. Follow your caregiver's or physical therapist's instructions. Perform all exercises about fifteen times, three times per day or as directed. Exercise both hips, even if you have had only one joint replacement. These exercises can be done on a training (exercise) mat, on the floor, on a table or on a bed. Use whatever works the best and is most comfortable for you. Use music or television while you are  exercising so that the exercises are a pleasant break in your day. This will make your life better with the exercises acting as a break in routine you can look forward to.  . Lying on your back, slowly slide your foot toward your buttocks, raising your knee up off the floor. Then slowly slide your foot back down until your leg is straight again.  . Lying on your back spread your legs as far apart as you can without causing discomfort.  . Lying on your side, raise your upper leg and foot straight up from the floor as far as is comfortable. Slowly lower the leg and repeat.  . Lying on your back, tighten up the muscle in the front of your thigh (quadriceps muscles). You can do this by keeping your leg straight and trying to raise your heel off the floor. This helps strengthen the largest muscle supporting your knee.  . Lying on your back, tighten up the muscles of your buttocks both with the legs straight and with the knee bent at a comfortable angle while keeping your heel on the floor.   SKILLED REHAB INSTRUCTIONS: If the patient is transferred to a skilled rehab facility following release from the hospital, a list of the current medications will be sent to the facility for the patient to continue.  When discharged from the skilled rehab facility, please have the facility set up the patient's Home Health Physical Therapy prior to being released. Also, the skilled facility will be responsible for providing the patient with their medications at time of release from the facility to include their pain medication and their blood thinner medication. If the patient is still at the rehab facility at time of the two week follow up appointment, the skilled rehab facility will also need to assist the patient in arranging follow up appointment in our office and any transportation needs.  POST-OPERATIVE OPIOID TAPER INSTRUCTIONS: . It is important to wean off of your opioid medication as soon as possible. If you do not  need pain medication after your surgery it is ok to stop day one. . Opioids include: o Codeine, Hydrocodone(Norco, Vicodin), Oxycodone(Percocet, oxycontin) and hydromorphone amongst others.  . Long term and even short term use of opiods can cause: o Increased pain response o Dependence o Constipation o Depression o Respiratory depression o And more.  . Withdrawal symptoms can include o Flu like symptoms o Nausea, vomiting o And more . Techniques to manage these symptoms o Hydrate well o Eat regular healthy meals o Stay active o Use relaxation techniques(deep breathing, meditating, yoga) . Do Not substitute Alcohol to help with tapering . If you have been on opioids for less than two weeks and do not have pain than it is ok to stop all together.  . Plan to wean off of opioids o This plan should start within one week post op of your joint replacement. o Maintain   the same interval or time between taking each dose and first decrease the dose.  o Cut the total daily intake of opioids by one tablet each day o Next start to increase the time between doses. o The last dose that should be eliminated is the evening dose.      MAKE SURE YOU:  . Understand these instructions.  . Will watch your condition.  . Will get help right away if you are not doing well or get worse.  Pick up stool softner and laxative for home use following surgery while on pain medications. Continue to use ice for pain and swelling after surgery. Do not use any lotions or creams on the incision until instructed by your surgeon. Total Hip Protocol.  Do not remove your dressing. Keep dressing clean and dry.  Charge VAC nightly. Call [phone] ASAP to schedule a follow up appointment within 7 days of discharge for VAC removal.

## 2020-10-31 NOTE — Progress Notes (Signed)
Held pt night medicine  because she was too drowsy to swallow, nurse managed to give patient tylenol and a bottle of chocolate Ensure, will reassess later for to need of those meds again , pt is sleeping now, no sign of discomfort, will continue to monitor

## 2020-10-31 NOTE — Interval H&P Note (Signed)
History and Physical Interval Note:  10/31/2020 12:15 PM  Teresa Richard  has presented today for surgery, with the diagnosis of Right Hip Fx.  The various methods of treatment have been discussed with the patient and family. After consideration of risks, benefits and other options for treatment, the patient has consented to  Procedure(s): TOTAL HIP ARTHROPLASTY ANTERIOR APPROACH BIPOLAR HIP (HEMIARTHROPLASTY) (Right) as a surgical intervention.  The patient's history has been reviewed, patient examined, no change in status, stable for surgery.  I have reviewed the patient's chart and labs.  Questions were answered to the patient's satisfaction.    R/B/A discussed with daughter   Iline Oven [place]

## 2020-10-31 NOTE — Anesthesia Postprocedure Evaluation (Signed)
Anesthesia Post Note  Patient: Teresa Richard  Procedure(s) Performed: HEMIARTHROPLASTY (Right Hip)     Patient location during evaluation: PACU Anesthesia Type: General Level of consciousness: awake and confused (at baseline) Pain management: pain level controlled Vital Signs Assessment: post-procedure vital signs reviewed and stable Respiratory status: spontaneous breathing, nonlabored ventilation, respiratory function stable and patient connected to nasal cannula oxygen Cardiovascular status: blood pressure returned to baseline and stable Postop Assessment: no apparent nausea or vomiting Anesthetic complications: no   No complications documented.  Last Vitals:  Vitals:   10/31/20 1630 10/31/20 1659  BP: (!) 113/45 (!) 97/50  Pulse: 83 76  Resp: 15   Temp: 36.6 C 36.7 C  SpO2: 100% 100%    Last Pain:  Vitals:   10/31/20 1659  TempSrc: Axillary  PainSc:                  [name]

## 2020-10-31 NOTE — Progress Notes (Signed)
PROGRESS NOTE        PATIENT DETAILS Name: Teresa Richard Age: 80 y.o. Sex: female Date of Birth: Nov 16, 1940 Admit Date: 10/26/2020 Admitting Physician Eduard Clos, MD [id number], Roderic Palau, MD  Brief Narrative: Patient is a 80 y.o. female with history of dementia, complete heart block-s/p PPM placement, hypothyroidism-presenting with a mechanical fall-found to have right hip fracture.  Significant events: 5/27>> admit for right hip fracture  Significant studies: 5/27>> x-ray right hip: Displaced right femoral neck fracture. 5/27>> x-ray right knee: No fracture  Antimicrobial therapy: None  Microbiology data: 5/27>> influenza/COVID PCR: Negative  Procedures : None  Consults: Ortho  DVT Prophylaxis : SCDs Start: 10/26/20 2130   Subjective: Lying comfortably in bed-she was sleeping when I walked in-she awoke-she is pleasantly confused.  Speech is slow but clear.  Assessment/Plan: Right hip fracture: Following a mechanical fall-orthopedics following-for hip repair on 6/1.   History of complete heart block-s/p PPM placement  Hypothyroidism: Continue Synthroid  History of dementia: At risk for delirium-continue to maintain delirium precautions.  She remains on Depakote/Seroquel.    Depression/anxiety: Continue Lexapro/BuSpar-and as needed Xanax  Normocytic anemia: Stable for follow-up-on iron supplementation  Vitamin B12 deficiency: On supplementation  Diet: Diet Order            Diet NPO time specified  Diet effective midnight                  Code Status:  DNR  Family Communication: Daughter-[name] at bedside on 5/29  Disposition Plan: Status is: Inpatient  Remains inpatient appropriate because:Inpatient level of care appropriate due to severity of illness   Dispo: The patient is from: SNF              Anticipated d/c is to: SNF              Patient currently is not medically stable to d/c.    Difficult to place patient No   Barriers to Discharge: Right hip fracture-awaiting operative repair on 6/1  Antimicrobial agents: Anti-infectives (From admission, onward)   Start     Dose/Rate Route Frequency Ordered Stop   10/31/20 1228  ceFAZolin (ANCEF) 2-4 GM/100ML-% IVPB       Note to Pharmacy: Clovis Cao   : cabinet override      10/31/20 1228 11/01/20 0044   10/30/20 0700  ceFAZolin (ANCEF) IVPB 2g/100 mL premix  Status:  Discontinued        2 g 200 mL/hr over 30 Minutes Intravenous On call to O.R. 10/30/20 6256 10/30/20 1642       Time spent: 25- minutes-Greater than 50% of this time was spent in counseling, explanation of diagnosis, planning of further management, and coordination of care.  MEDICATIONS: Scheduled Meds: . [MAR Hold] busPIRone  10 mg Oral BID  . [MAR Hold] busPIRone  7.5 mg Oral Q1500  . [MAR Hold] diclofenac Sodium  2 g Topical QID  . [MAR Hold] divalproex  125 mg Oral BID  . [MAR Hold] escitalopram  10 mg Oral Daily  . [MAR Hold] ferrous sulfate  325 mg Oral Q breakfast  . [MAR Hold] levothyroxine  75 mcg Oral Q0600  . [MAR Hold] melatonin  10 mg Oral QHS  . [MAR Hold] multivitamin with minerals  1 tablet Oral Daily  . [MAR Hold] Pimavanserin Tartrate  34 mg Oral Daily  . [  MAR Hold] QUEtiapine  100 mg Oral QHS  . [MAR Hold] QUEtiapine  75 mg Oral q AM  . [MAR Hold] vitamin B-12  500 mcg Oral Daily   Continuous Infusions: . ceFAZolin    . lactated ringers 10 mL/hr at 10/31/20 1230   PRN Meds:.0.9 % irrigation (POUR BTL), [MAR Hold] acetaminophen, [MAR Hold] ALPRAZolam, bupivacaine-epinephrine, [MAR Hold] fentaNYL (SUBLIMAZE) injection, ketorolac, [MAR Hold] ondansetron (ZOFRAN) IV, [MAR Hold] polyethylene glycol, sodium chloride (PF), sodium chloride irrigation   PHYSICAL EXAM: Vital signs: Vitals:   10/31/20 0028 10/31/20 0600 10/31/20 0807 10/31/20 1135  BP: (!) 87/57 (!) 128/57 (!) 111/58 (!) 134/41  Pulse: 71 63 65 66  Resp: 18 16 14  16   Temp:  98.6 F (37 C) 98 F (36.7 C) (!) 97.5 F (36.4 C)  TempSrc:  Oral Oral Axillary  SpO2: 91% 90% 100% 100%  Weight:      Height:       Filed Weights   10/26/20 2059 10/29/20 1200  Weight: 59 kg 47.1 kg   Body mass index is 20.28 kg/m.   Gen Exam:Alert awake-not in any distress HEENT:atraumatic, normocephalic Chest: B/L clear to auscultation anteriorly CVS:S1S2 regular Abdomen:soft non tender, non distended Extremities:no edema Neurology: Seems to be moving all 4 extremities. Skin: no rash  I have personally reviewed following labs and imaging studies  LABORATORY DATA: CBC: Recent Labs  Lab 10/26/20 1954 10/27/20 0948 10/28/20 0418 10/31/20 0818  WBC 5.5 5.7 4.6 5.4  NEUTROABS 3.6  --   --   --   HGB 9.8* 9.9* 9.7* 10.4*  HCT 31.5* 31.5* 31.5* 33.6*  MCV 97.5 96.0 97.2 97.1  PLT 328 306 292 301    Basic Metabolic Panel: Recent Labs  Lab 10/26/20 1954 10/27/20 0948 10/28/20 0418 10/31/20 0818  NA 143 142 141 145  K 4.4 3.7 4.3 4.3  CL 102 105 104 102  CO2 32 29 34* 37*  GLUCOSE 117* 97 98 99  BUN 31* 28* 24* 35*  CREATININE 0.75 0.68 0.67 0.76  CALCIUM 9.2 9.1 9.2 9.6    GFR: Estimated Creatinine Clearance: 40.3 mL/min (by C-G formula based on SCr of 0.76 mg/dL).  Liver Function Tests: No results for input(s): AST, ALT, ALKPHOS, BILITOT, PROT, ALBUMIN in the last 168 hours. No results for input(s): LIPASE, AMYLASE in the last 168 hours. No results for input(s): AMMONIA in the last 168 hours.  Coagulation Profile: Recent Labs  Lab 10/26/20 1954  INR 1.0    Cardiac Enzymes: No results for input(s): CKTOTAL, CKMB, CKMBINDEX, TROPONINI in the last 168 hours.  BNP (last 3 results) No results for input(s): PROBNP in the last 8760 hours.  Lipid Profile: No results for input(s): CHOL, HDL, LDLCALC, TRIG, CHOLHDL, LDLDIRECT in the last 72 hours.  Thyroid Function Tests: No results for input(s): TSH, T4TOTAL, FREET4, T3FREE,  THYROIDAB in the last 72 hours.  Anemia Panel: No results for input(s): VITAMINB12, FOLATE, FERRITIN, TIBC, IRON, RETICCTPCT in the last 72 hours.  Urine analysis:    Component Value Date/Time   COLORURINE YELLOW (A) 05/08/2020 0502   APPEARANCEUR HAZY (A) 05/08/2020 0502   LABSPEC 1.024 05/08/2020 0502   PHURINE 5.0 05/08/2020 0502   GLUCOSEU NEGATIVE 05/08/2020 0502   HGBUR NEGATIVE 05/08/2020 0502   BILIRUBINUR NEGATIVE 05/08/2020 0502   KETONESUR NEGATIVE 05/08/2020 0502   PROTEINUR NEGATIVE 05/08/2020 0502   NITRITE NEGATIVE 05/08/2020 0502   LEUKOCYTESUR NEGATIVE 05/08/2020 0502    Sepsis Labs: Lactic Acid, Venous  No results found for: LATICACIDVEN  MICROBIOLOGY: Recent Results (from the past 240 hour(s))  Resp Panel by RT-PCR (Flu A&B, Covid) Nasopharyngeal Swab     Status: None   Collection Time: 10/26/20  7:26 PM   Specimen: Nasopharyngeal Swab; Nasopharyngeal(NP) swabs in vial transport medium  Result Value Ref Range Status   SARS Coronavirus 2 by RT PCR NEGATIVE NEGATIVE Final    Comment: (NOTE) SARS-CoV-2 target nucleic acids are NOT DETECTED.  The SARS-CoV-2 RNA is generally detectable in upper respiratory specimens during the acute phase of infection. The lowest concentration of SARS-CoV-2 viral copies this assay can detect is 138 copies/mL. A negative result does not preclude SARS-Cov-2 infection and should not be used as the sole basis for treatment or other patient management decisions. A negative result may occur with  improper specimen collection/handling, submission of specimen other than nasopharyngeal swab, presence of viral mutation(s) within the areas targeted by this assay, and inadequate number of viral copies(<138 copies/mL). A negative result must be combined with clinical observations, patient history, and epidemiological information. The expected result is Negative.  Fact Sheet for Patients:   BloggerCourse.com  Fact Sheet for Healthcare Providers:  SeriousBroker.it  This test is no t yet approved or cleared by the [place] FDA and  has been authorized for detection and/or diagnosis of SARS-CoV-2 by FDA under an Emergency Use Authorization (EUA). This EUA will remain  in effect (meaning this test can be used) for the duration of the COVID-19 declaration under Section 564(b)(1) of the Act, 21 U.S.C.section 360bbb-3(b)(1), unless the authorization is terminated  or revoked sooner.       Influenza A by PCR NEGATIVE NEGATIVE Final   Influenza B by PCR NEGATIVE NEGATIVE Final    Comment: (NOTE) The Xpert Xpress SARS-CoV-2/FLU/RSV plus assay is intended as an aid in the diagnosis of influenza from Nasopharyngeal swab specimens and should not be used as a sole basis for treatment. Nasal washings and aspirates are unacceptable for Xpert Xpress SARS-CoV-2/FLU/RSV testing.  Fact Sheet for Patients: BloggerCourse.com  Fact Sheet for Healthcare Providers: SeriousBroker.it  This test is not yet approved or cleared by the [place] FDA and has been authorized for detection and/or diagnosis of SARS-CoV-2 by FDA under an Emergency Use Authorization (EUA). This EUA will remain in effect (meaning this test can be used) for the duration of the COVID-19 declaration under Section 564(b)(1) of the Act, 21 U.S.C. section 360bbb-3(b)(1), unless the authorization is terminated or revoked.  Performed at [facility], [address] [place], [place] [address]   Surgical pcr screen     Status: None   Collection Time: 10/29/20  7:43 AM   Specimen: Nasal Mucosa; Nasal Swab  Result Value Ref Range Status   MRSA, PCR NEGATIVE NEGATIVE Final   Staphylococcus aureus NEGATIVE NEGATIVE Final    Comment: (NOTE) The Xpert SA Assay (FDA approved for NASAL specimens in patients  71 years of age and older), is one component of a comprehensive surveillance program. It is not intended to diagnose infection nor to guide or monitor treatment. Performed at [facility], [address], [place], [place] [address]     RADIOLOGY STUDIES/RESULTS: No results found.   LOS: 5 days   Jeoffrey Massed, MD  [facility]    To contact the attending provider between 7A-7P or the covering provider during after hours 7P-7A, please log into the web site [email or web address] and access using universal [place] password for that web site.  If you do not have the password, please call the hospital operator.  10/31/2020, 2:21 PM

## 2020-10-31 NOTE — Anesthesia Procedure Notes (Signed)
Procedure Name: Intubation Date/Time: 10/31/2020 12:42 PM Performed by: Rande Brunt, CRNA Pre-anesthesia Checklist: Patient identified, Emergency Drugs available, Suction available and Patient being monitored Patient Re-evaluated:Patient Re-evaluated prior to induction Oxygen Delivery Method: Circle System Utilized Preoxygenation: Pre-oxygenation with 100% oxygen Induction Type: IV induction Ventilation: Mask ventilation without difficulty Laryngoscope Size: Mac and 3 Grade View: Grade I Tube type: Oral Tube size: 7.0 mm Number of attempts: 1 Airway Equipment and Method: Stylet Placement Confirmation: ETT inserted through vocal cords under direct vision,  positive ETCO2 and breath sounds checked- equal and bilateral Secured at: 21 cm Tube secured with: Tape Dental Injury: Teeth and Oropharynx as per pre-operative assessment

## 2020-11-01 ENCOUNTER — Encounter (HOSPITAL_COMMUNITY): Payer: Self-pay | Admitting: Orthopedic Surgery

## 2020-11-01 DIAGNOSIS — W19XXXA Unspecified fall, initial encounter: Secondary | ICD-10-CM | POA: Diagnosis not present

## 2020-11-01 DIAGNOSIS — E079 Disorder of thyroid, unspecified: Secondary | ICD-10-CM | POA: Diagnosis not present

## 2020-11-01 DIAGNOSIS — S72001A Fracture of unspecified part of neck of right femur, initial encounter for closed fracture: Secondary | ICD-10-CM | POA: Diagnosis not present

## 2020-11-01 DIAGNOSIS — D519 Vitamin B12 deficiency anemia, unspecified: Secondary | ICD-10-CM | POA: Diagnosis not present

## 2020-11-01 LAB — BASIC METABOLIC PANEL
Anion gap: 9 (ref 5–15)
BUN: 45 mg/dL — ABNORMAL HIGH (ref 8–23)
CO2: 34 mmol/L — ABNORMAL HIGH (ref 22–32)
Calcium: 9.3 mg/dL (ref 8.9–10.3)
Chloride: 99 mmol/L (ref 98–111)
Creatinine, Ser: 1.36 mg/dL — ABNORMAL HIGH (ref 0.44–1.00)
GFR, Estimated: 39 mL/min — ABNORMAL LOW (ref >60)
Glucose, Bld: 170 mg/dL — ABNORMAL HIGH (ref 70–99)
Potassium: 5.7 mmol/L — ABNORMAL HIGH (ref 3.5–5.1)
Sodium: 142 mmol/L (ref 135–145)

## 2020-11-01 LAB — CBC
HCT: 25.8 % — ABNORMAL LOW (ref 36.0–46.0)
Hemoglobin: 7.9 g/dL — ABNORMAL LOW (ref 12.0–15.0)
MCH: 29.7 pg (ref 26.0–34.0)
MCHC: 30.6 g/dL (ref 30.0–36.0)
MCV: 97 fL (ref 80.0–100.0)
Platelets: 271 10*3/uL (ref 150–400)
RBC: 2.66 MIL/uL — ABNORMAL LOW (ref 3.87–5.11)
RDW: 13.6 % (ref 11.5–15.5)
WBC: 12.3 10*3/uL — ABNORMAL HIGH (ref 4.0–10.5)
nRBC: 0 % (ref 0.0–0.2)

## 2020-11-01 MED ORDER — ASPIRIN 81 MG PO CHEW: 81.0000 mg | CHEWABLE_TABLET | Freq: Two times a day (BID) | ORAL | 0 refills | Status: AC

## 2020-11-01 MED ORDER — SODIUM CHLORIDE 0.9 % IV SOLN: INTRAVENOUS | Status: AC

## 2020-11-01 MED ORDER — SODIUM ZIRCONIUM CYCLOSILICATE 10 G PO PACK
10.0000 g | PACK | Freq: Three times a day (TID) | ORAL | Status: AC
  Administered 2020-11-01 (×3): 10 g via ORAL
  Filled 2020-11-01 (×3): qty 1

## 2020-11-01 MED ORDER — TRAMADOL HCL 50 MG PO TABS: 50.0000 mg | ORAL_TABLET | Freq: Four times a day (QID) | ORAL | 0 refills | Status: AC | PRN

## 2020-11-01 NOTE — TOC Initial Note (Signed)
Transition of Care [facility]) - Initial/Assessment Note    Patient Details  Name: Teresa Richard MRN: [id number] Date of Birth: 1940-12-08  Transition of Care [facility]) CM/SW Contact:    Ralene Bathe, LCSWA Phone Number: 11/01/2020, 3:27 PM  Clinical Narrative:                 CSW received consult for possible SNF placement at time of discharge. CSW spoke with patient's daughter, Zella Ball, as the patient has dementia.  [name] expressed understanding of PT recommendation and is agreeable to SNF placement at time of discharge. Zella Ball would like a facility in [place] preferably [facility], [facility], [place], or [place].  CSW was informed that if none of those agencies accepted the patient the search could be expanded to [facility].  CSW discussed insurance authorization process.    CSW spoke with Eunice Blase at [facility] [phone].  CSW was informed that the agency did not provide skilled nursing services but that the facility would go reevaluate the patient to see if she would be accepted back to the ALF.  The patient's daughter was informed of this information.    CSW will follow up with the daughter once bed offers are given.     Expected Discharge Plan: Skilled Nursing Facility Barriers to Discharge: Continued Medical Work up,Insurance Authorization,SNF Pending bed offer   Patient Goals and CMS Choice   CMS Medicare.gov Compare Post Acute Care list provided to:: Patient Represenative (must comment) Choice offered to / list presented to : Adult Children  Expected Discharge Plan and Services Expected Discharge Plan: Skilled Nursing Facility       Living arrangements for the past 2 months: Assisted Living Facility                                      Prior Living Arrangements/Services Living arrangements for the past 2 months: Assisted Living Facility Lives with:: Facility Resident Patient language and need for interpreter reviewed:: Yes Do you feel safe going back to  the place where you live?: Yes      Need for Family Participation in Patient Care: Yes (Comment) Care giver support system in place?: Yes (comment)   Criminal Activity/Legal Involvement Pertinent to Current Situation/Hospitalization: No - Comment as needed  Activities of Daily Living Home Assistive Devices/Equipment: Dan Humphreys (specify type),Dentures (specify type) ADL Screening (condition at time of admission) Patient's cognitive ability adequate to safely complete daily activities?: No Is the patient deaf or have difficulty hearing?: No Does the patient have difficulty seeing, even when wearing glasses/contacts?: No Does the patient have difficulty concentrating, remembering, or making decisions?: Yes Patient able to express need for assistance with ADLs?: Yes Does the patient have difficulty dressing or bathing?: Yes Independently performs ADLs?: Yes (appropriate for developmental age) Does the patient have difficulty walking or climbing stairs?: Yes Weakness of Legs: Right Weakness of Arms/Hands: None  Permission Sought/Granted                  Emotional Assessment Appearance:: Appears stated age     Orientation: : Oriented to Self Alcohol / Substance Use: Not Applicable Psych Involvement: No (comment)  Admission diagnosis:  Hip fracture (HCC) [S72.009A] Pain [R52] Fall, initial encounter [W19.XXXA] Closed fracture of right hip, initial encounter (HCC) [S72.001A] Closed right hip fracture, initial encounter [facility]) [S72.001A] Patient Active Problem List   Diagnosis Date Noted  . Protein-calorie malnutrition, severe 10/30/2020  .  Closed right hip fracture, initial encounter (HCC) 10/26/2020  . Hip fracture (HCC) 10/26/2020  . COVID-19 virus infection 02/16/2019  . B12 deficiency anemia 02/16/2019  . Syncope 02/09/2019  . Pancytopenia (HCC) 02/09/2019  . 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 Thyroid disease 01/23/2014  . 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 [place] [address] Phone: [phone] Fax: [phone]     Social Determinants of Health (SDOH) Interventions    Readmission Risk Interventions No flowsheet data found.

## 2020-11-01 NOTE — Plan of Care (Signed)
Patient was extremely sleepy in the AM, but with time patient became more alert, one PRN tylenol give due to what seemed to be pain related. Patient tolerated it well.    Problem: Education: Goal: Knowledge of General Education information will improve Description: Including pain rating scale, medication(s)/side effects and non-pharmacologic comfort measures Outcome: Progressing   Problem: Activity: Goal: Risk for activity intolerance will decrease Outcome: Progressing   Problem: Pain Managment: Goal: General experience of comfort will improve Outcome: Progressing   Problem: Safety: Goal: Ability to remain free from injury will improve Outcome: Progressing   Problem: Skin Integrity: Goal: Risk for impaired skin integrity will decrease Outcome: Progressing

## 2020-11-01 NOTE — Progress Notes (Signed)
    Subjective:  Patient nonverbal this morning. Patient appears to be comfortable in bed. Patient is able to follow commands to move left toes  Objective:   VITALS:   Vitals:   10/31/20 1957 11/01/20 0023 11/01/20 0648 11/01/20 0825  BP: 94/69 (!) 103/53 (!) 149/121 (!) 128/114  Pulse: 80 82 78 88  Resp: 18 15 16    Temp: 97.9 F (36.6 C) 97.9 F (36.6 C) 98.3 F (36.8 C) 98.4 F (36.9 C)  TempSrc: Oral Oral Oral Axillary  SpO2: 100%  100% 100%  Weight:      Height:        NAD ABD soft Neurovascular intact Sensation intact distally Intact pulses distally Dorsiflexion/Plantar flexion intact Incision: Incisional vac in place and functional   Lab Results  Component Value Date   WBC 12.3 (H) 11/01/2020   HGB 7.9 (L) 11/01/2020   HCT 25.8 (L) 11/01/2020   MCV 97.0 11/01/2020   PLT 271 11/01/2020   BMET    Component Value Date/Time   NA 142 11/01/2020 0253   K 5.7 (H) 11/01/2020 0253   CL 99 11/01/2020 0253   CO2 34 (H) 11/01/2020 0253   GLUCOSE 170 (H) 11/01/2020 0253   BUN 45 (H) 11/01/2020 0253   CREATININE 1.36 (H) 11/01/2020 0253   CALCIUM 9.3 11/01/2020 0253   GFRNONAA 39 (L) 11/01/2020 0253   GFRAA >60 10/22/2019 1819     Assessment/Plan: 1 Day Post-Op   Principal Problem:   Closed right hip fracture, initial encounter (HCC) Active Problems:   H/O cardiac pacemaker   Thyroid disease   Pancytopenia (HCC)   B12 deficiency anemia   Hip fracture (HCC)   Protein-calorie malnutrition, severe   WBAT with walker DVT ppx: Aspirin, SCDs, TEDS PO pain control PT/OT ABLA: HgB 7.9 this AM.  Treat per hospitalist recommendations Change house vac to prevena portable vac at discharge.  Follow up with Dr.Swinteck's clinic 1 week post discharge for dressing removal Dispo: D/C pending     10/24/2019 11/01/2020, 11:39 AM  [facility] is now [facility] [address], [place], [place] Phone:  [phone] [email or web address] Facebook  [phone]

## 2020-11-01 NOTE — NC FL2 (Signed)
[place] MEDICAID FL2 LEVEL OF CARE SCREENING TOOL     IDENTIFICATION  Patient Name: Teresa Richard Birthdate: 01-Jul-1940 Sex: female Admission Date (Current Location): 10/26/2020  [facility] and [place] Number:  Producer, television/film/video and Address:  [facility], [address], [place], [place] [address]      Provider Number: [id number]  Attending Physician Name and Address:  Maretta Bees, MD  Relative Name and Phone Number:  Merdis Delay (Daughter)   [phone]    Current Level of Care: Hospital Recommended Level of Care: Skilled Nursing Facility Prior Approval Number:    Date Approved/Denied:   PASRR Number: [id number]  Discharge Plan: SNF    Current Diagnoses: Patient Active Problem List   Diagnosis Date Noted  . Protein-calorie malnutrition, severe 10/30/2020  . Closed right hip fracture, initial encounter (HCC) 10/26/2020  . Hip fracture (HCC) 10/26/2020  . COVID-19 virus infection 02/16/2019  . B12 deficiency anemia 02/16/2019  . Syncope 02/09/2019  . Pancytopenia (HCC) 02/09/2019  . 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 Thyroid disease 01/23/2014  . 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    Orientation RESPIRATION BLADDER Height & Weight     Self  O2 (nasal cannula) Incontinent Weight: 103 lb 13.4 oz (47.1 kg) Height:  5' (152.4 cm)  BEHAVIORAL SYMPTOMS/MOOD NEUROLOGICAL BOWEL NUTRITION STATUS  Wanderer   Incontinent Diet (see d/c summary)  AMBULATORY STATUS COMMUNICATION OF NEEDS Skin   Extensive Assist Verbally Surgical wounds                       Personal Care Assistance Level of Assistance  Bathing,Feeding,Dressing Bathing Assistance: Maximum assistance Feeding assistance: Limited assistance Dressing Assistance: Maximum assistance     Functional Limitations Info  Sight,Hearing,Speech Sight Info: Impaired Hearing Info: Adequate Speech Info: Adequate    SPECIAL CARE FACTORS FREQUENCY  PT (By licensed  PT),OT (By licensed OT)     PT Frequency: 5x/ week OT Frequency: 5x/ week            Contractures Contractures Info: Not present    Additional Factors Info  Code Status,Allergies Code Status Info: DNR Allergies Info: Hydrocodone;  Oxycodone           Current Medications (11/01/2020):  This is the current hospital active medication list Current Facility-Administered Medications  Medication Dose Route Frequency Provider Last Rate Last Admin  . 0.9 %  sodium chloride infusion   Intravenous Continuous Maretta Bees, MD 50 mL/hr at 11/01/20 1022 New Bag at 11/01/20 1022  . acetaminophen (TYLENOL) tablet 325-650 mg  325-650 mg Oral Q6H PRN Samson Frederic, MD   650 mg at 11/01/20 0641  . ALPRAZolam Prudy Feeler) tablet 0.5 mg  0.5 mg Oral BID PRN Samson Frederic, MD   0.5 mg at 10/30/20 1829  . aspirin EC tablet 325 mg  325 mg Oral Q breakfast Samson Frederic, MD   325 mg at 11/01/20 1016  . busPIRone (BUSPAR) tablet 10 mg  10 mg Oral BID Samson Frederic, MD   10 mg at 11/01/20 1016  . busPIRone (BUSPAR) tablet 7.5 mg  7.5 mg Oral Q1500 Samson Frederic, MD   7.5 mg at 10/31/20 1742  . diclofenac Sodium (VOLTAREN) 1 % topical gel 2 g  2 g Topical QID Samson Frederic, MD   2 g at 11/01/20 1023  . divalproex (DEPAKOTE SPRINKLE) capsule 125 mg  125 mg Oral BID Samson Frederic, MD   125 mg at 11/01/20 1016  .  docusate sodium (COLACE) capsule 100 mg  100 mg Oral BID Swinteck, Arlys John, MD      . escitalopram (LEXAPRO) tablet 10 mg  10 mg Oral Daily Samson Frederic, MD   10 mg at 11/01/20 1016  . fentaNYL (SUBLIMAZE) injection 25 mcg  25 mcg Intravenous Q4H PRN Maretta Bees, MD      . ferrous sulfate tablet 325 mg  325 mg Oral Q breakfast Samson Frederic, MD   325 mg at 11/01/20 1016  . levothyroxine (SYNTHROID) tablet 75 mcg  75 mcg Oral Q0600 Samson Frederic, MD   75 mcg at 11/01/20 0641  . melatonin tablet 10 mg  10 mg Oral QHS Samson Frederic, MD   10 mg at 10/30/20 2333  .  menthol-cetylpyridinium (CEPACOL) lozenge 3 mg  1 lozenge Oral PRN Swinteck, Arlys John, MD       Or  . phenol (CHLORASEPTIC) mouth spray 1 spray  1 spray Mouth/Throat PRN Swinteck, Arlys John, MD      . methocarbamol (ROBAXIN) tablet 500 mg  500 mg Oral Q6H PRN Samson Frederic, MD   500 mg at 10/31/20 1742   Or  . methocarbamol (ROBAXIN) 500 mg in dextrose 5 % 50 mL IVPB  500 mg Intravenous Q6H PRN Swinteck, Arlys John, MD      . metoCLOPramide (REGLAN) tablet 5-10 mg  5-10 mg Oral Q8H PRN Swinteck, Arlys John, MD       Or  . metoCLOPramide (REGLAN) injection 5-10 mg  5-10 mg Intravenous Q8H PRN Swinteck, Arlys John, MD      . multivitamin with minerals tablet 1 tablet  1 tablet Oral Daily Samson Frederic, MD   1 tablet at 11/01/20 1015  . ondansetron (ZOFRAN) tablet 4 mg  4 mg Oral Q6H PRN Swinteck, Arlys John, MD       Or  . ondansetron (ZOFRAN) injection 4 mg  4 mg Intravenous Q6H PRN Swinteck, Arlys John, MD      . Pimavanserin Tartrate CAPS 34 mg - PATIENT SUPPLIED  34 mg Oral Daily Samson Frederic, MD   34 mg at 11/01/20 1027  . polyethylene glycol (MIRALAX / GLYCOLAX) packet 17 g  17 g Oral Daily PRN Samson Frederic, MD   17 g at 10/28/20 1054  . QUEtiapine (SEROQUEL) tablet 100 mg  100 mg Oral QHS Samson Frederic, MD   100 mg at 10/30/20 2333  . QUEtiapine (SEROQUEL) tablet 75 mg  75 mg Oral q AM Samson Frederic, MD   75 mg at 10/30/20 0825  . senna (SENOKOT) tablet 8.6 mg  1 tablet Oral BID Samson Frederic, MD   8.6 mg at 11/01/20 1015  . sodium zirconium cyclosilicate (LOKELMA) packet 10 g  10 g Oral TID Maretta Bees, MD   10 g at 11/01/20 1028  . vitamin B-12 (CYANOCOBALAMIN) tablet 500 mcg  500 mcg Oral Daily Samson Frederic, MD   500 mcg at 11/01/20 1015     Discharge Medications: Please see discharge summary for a list of discharge medications.  Relevant Imaging Results:  Relevant Lab Results:   Additional Information SS#[id number]  [name], LCSWA

## 2020-11-01 NOTE — Plan of Care (Signed)

## 2020-11-01 NOTE — Evaluation (Signed)
Physical Therapy Evaluation Patient Details Name: Sonyia Muro MRN: [id number] DOB: [birth year] Today's Date: 11/01/2020   History of Present Illness  Pt is 80 yo female who presented to ED with a mechanical fall-found to have right hip fracture. PMH includes advanced dementia, complete heart block-s/p PPM placement, hypothyroidism. Pt is now s/p Right hip hemiarthroplasty, anterior approach on 10/31/20.  Clinical Impression  Patient received sleeping in bed, rouses to my voice. Patient is not verbal. Does not answer questions nor speak at all other than "no" when returning [place] to patient. She requires mod +2 assist for supine to sit with max multimodal cues. Patient is able to sit unsupported at edge of bed once positioned there. She is tearful throughout session. Patient is unable to attempt standing. Required total assist +2 to return to supine. Patient will continue to benefit from skilled PT while here to improve functional mobility.        Follow Up Recommendations SNF;Supervision/Assistance - 24 hour    Equipment Recommendations  Other (comment) (TBD next venue)    Recommendations for Other Services       Precautions / Restrictions Precautions Precautions: Fall Precaution Comments: No active abduction for 6 weeks Restrictions Weight Bearing Restrictions: Yes RLE Weight Bearing: Weight bearing as tolerated Other Position/Activity Restrictions: needs walker to stand/mobilize      Mobility  Bed Mobility Overal bed mobility: Needs Assistance Bed Mobility: Supine to Sit;Sit to Supine     Supine to sit: Mod assist;+2 for physical assistance Sit to supine: Max assist;+2 for physical assistance   General bed mobility comments: Patient does assist some with getting to edge of bed.    Transfers                 General transfer comment: unable  Ambulation/Gait             General Gait Details: unable  Stairs            Wheelchair Mobility    Modified  [place] (Stroke Patients Only)       Balance Overall balance assessment: Needs assistance Sitting-balance support: Feet supported Sitting balance-Leahy Scale: Good Sitting balance - Comments: able to sit edge of bed unsupported       Standing balance comment: unable to try                             Pertinent Vitals/Pain Pain Assessment: Faces Faces Pain Scale: Hurts even more Pain Location: R hip Pain Descriptors / Indicators: Crying;Discomfort;Grimacing;Guarding Pain Intervention(s): Limited activity within patient's tolerance;Monitored during session;Repositioned    Home Living Family/patient expects to be discharged to:: Skilled nursing facility                 Additional Comments: from memory care unit    Prior Function Level of Independence: Needs assistance   Gait / Transfers Assistance Needed: Patient had been using wheelchair more often at home. Had several recent falls.  ADL's / Homemaking Assistance Needed: assist with all ADLs  Comments: from memory care unit     Hand Dominance   Dominant Hand: Right    Extremity/Trunk Assessment   Upper Extremity Assessment Upper Extremity Assessment: Defer to OT evaluation    Lower Extremity Assessment Lower Extremity Assessment: Difficult to assess due to impaired cognition    Cervical / Trunk Assessment Cervical / Trunk Assessment: Normal  Communication   Communication: Expressive difficulties  Cognition Arousal/Alertness: Awake/alert Behavior During Therapy: Anxious (  tearful) Overall Cognitive Status: History of cognitive impairments - at baseline                                 General Comments: occasional soft volume one word vocalizations. Unable to state name. Focused attention level. Anxious and crying but initiated coming to EOB with moderate cueing      General Comments General comments (skin integrity, edema, etc.): On 2L [place], briefly on RA while seated EOB. Unable  to get reliable O2 reading so reapplied [place] at 2L    Exercises     Assessment/Plan    PT Assessment Patient needs continued PT services  PT Problem List Decreased strength;Decreased activity tolerance;Decreased mobility;Decreased cognition;Pain;Decreased range of motion       PT Treatment Interventions DME instruction;Gait training;Functional mobility training;Therapeutic activities;Patient/family education;Therapeutic exercise    PT Goals (Current goals can be found in the Care Plan section)  Acute Rehab PT Goals Patient Stated Goal: none stated PT Goal Formulation: Patient unable to participate in goal setting Time For Goal Achievement: 11/15/20    Frequency Min 3X/week   Barriers to discharge        Co-evaluation PT/OT/SLP Co-Evaluation/Treatment: Yes Reason for Co-Treatment: Necessary to address cognition/behavior during functional activity;For patient/therapist safety;To address functional/ADL transfers PT goals addressed during session: Mobility/safety with mobility;Balance OT goals addressed during session: ADL's and self-care;Strengthening/ROM       AM-PAC PT "6 Clicks" Mobility  Outcome Measure Help needed turning from your back to your side while in a flat bed without using bedrails?: A Lot Help needed moving from lying on your back to sitting on the side of a flat bed without using bedrails?: A Lot Help needed moving to and from a bed to a chair (including a wheelchair)?: Total Help needed standing up from a chair using your arms (e.g., wheelchair or bedside chair)?: Total Help needed to walk in hospital room?: Total Help needed climbing 3-5 steps with a railing? : Total 6 Click Score: 8    End of Session   Activity Tolerance: Patient limited by pain Patient left: in bed;with bed alarm set Nurse Communication: Mobility status PT Visit Diagnosis: Other abnormalities of gait and mobility (R26.89);Repeated falls (R29.6);Pain Pain - Right/Left: Right Pain -  part of body: Leg;Hip    Time: 4742-5956 PT Time Calculation (min) (ACUTE ONLY): 24 min   Charges:   PT Evaluation $PT Eval Moderate Complexity: 1 Mod          Charistopher Rumble, PT, GCS 11/01/20,10:04 AM

## 2020-11-01 NOTE — Progress Notes (Addendum)
SNF authorization initiated in [employer] portal. Initial clinicals submitted.

## 2020-11-01 NOTE — Evaluation (Signed)
Occupational Therapy Evaluation Patient Details Name: Teresa Richard MRN: [id number] DOB: 13-Dec-1940 Today's Date: 11/01/2020    History of Present Illness Pt is 80 yo female who presented to ED with a mechanical fall-found to have right hip fracture. PMH includes advanced dementia, complete heart block-s/p PPM placement, hypothyroidism. Pt is now s/p Right hip hemiarthroplasty, anterior approach on 10/31/20.   Clinical Impression   Pt admitted with the above diagnoses and presents with below problem list. Pt will benefit from continued acute OT to address the below listed deficits and maximize independence with basic ADLs prior to d/c to venue below. PTA pt was residing in memory care unit, assist with basic ADLs, was somewhat ambulatory per chart review. Pt currently needs +2 assist with sit>supine, total A with ADLs. She able to tolerate sitting EOB about 3 minutes with BUE and min guard to min A. Tearful throughout session, attempted to console pt. Pt appeared to become anxious when rw placed in front of her; she indicated she did not want to stand. Helped return pt back to supine position.      Follow Up Recommendations  Other (comment) (needs continued rehab in skilled care setting. Ideally back to her facility if they can provide the level of assistance she currently needs.)    Equipment Recommendations  Other (comment) (TBD)    Recommendations for Other Services       Precautions / Restrictions Precautions Precautions: Fall Precaution Comments: No active abduction for 6 weeks Restrictions Weight Bearing Restrictions: Yes RLE Weight Bearing: Weight bearing as tolerated Other Position/Activity Restrictions: needs walker to stand/mobilize      Mobility Bed Mobility Overal bed mobility: Needs Assistance Bed Mobility: Supine to Sit;Sit to Supine     Supine to sit: Mod assist;+2 for physical assistance Sit to supine: Max assist;+2 for physical assistance   General bed mobility  comments: Patient does assist some with getting to edge of bed.    Transfers                 General transfer comment: unable    Balance Overall balance assessment: Needs assistance Sitting-balance support: Feet supported Sitting balance-Leahy Scale: Good Sitting balance - Comments: able to sit edge of bed unsupported       Standing balance comment: unable to try                           ADL either performed or assessed with clinical judgement   ADL Overall ADL's : Needs assistance/impaired Eating/Feeding: Total assistance   Grooming: Total assistance   Upper Body Bathing: Total assistance;Sitting Upper Body Bathing Details (indicate cue type and reason): min guard static sitting balance, total A for tasks Lower Body Bathing: Total assistance;Bed level   Upper Body Dressing : Sitting;Maximal assistance;Total assistance Upper Body Dressing Details (indicate cue type and reason): min guard static sitting balance, total A for task Lower Body Dressing: Total assistance;Bed level                 General ADL Comments: Completed supine to EOB going to pt's right side with up to min A. Sat EOB using BUE support about about 8 minutes with min guard assist. Placed rw in front of pt and cued to stand. Pt appeared anxious and indicated she did not want to stand. Assist to return to bed.     Vision         Perception     Praxis  Pertinent Vitals/Pain Pain Assessment: Faces Faces Pain Scale: Hurts even more Pain Location: R hip Pain Descriptors / Indicators: Crying;Discomfort;Grimacing;Guarding Pain Intervention(s): Limited activity within patient's tolerance;Monitored during session;Repositioned     Hand Dominance Right   Extremity/Trunk Assessment Upper Extremity Assessment Upper Extremity Assessment: Defer to OT evaluation   Lower Extremity Assessment Lower Extremity Assessment: Difficult to assess due to impaired cognition   Cervical /  Trunk Assessment Cervical / Trunk Assessment: Normal   Communication Communication Communication: Expressive difficulties   Cognition Arousal/Alertness: Awake/alert Behavior During Therapy: Anxious (tearful) Overall Cognitive Status: History of cognitive impairments - at baseline                                 General Comments: occasional soft volume one word vocalizations. Unable to state name. Focused attention level. Anxious and crying but initiated coming to EOB with moderate cueing   General Comments  On 2L [place], briefly on RA while seated EOB. Unable to get reliable O2 reading so reapplied [place] at 2L    Exercises     Shoulder Instructions      Home Living Family/patient expects to be discharged to:: Skilled nursing facility                                 Additional Comments: from memory care unit      Prior Functioning/Environment Level of Independence: Needs assistance  Gait / Transfers Assistance Needed: Patient had been using wheelchair more often at home. Had several recent falls. ADL's / Homemaking Assistance Needed: assist with all ADLs Communication / Swallowing Assistance Needed: unknown baseline Comments: from memory care unit        OT Problem List: Decreased strength;Decreased activity tolerance;Impaired balance (sitting and/or standing);Decreased cognition;Decreased safety awareness;Decreased knowledge of use of DME or AE;Decreased knowledge of precautions;Cardiopulmonary status limiting activity;Pain      OT Treatment/Interventions: Self-care/ADL training;Therapeutic exercise;DME and/or AE instruction;Therapeutic activities;Patient/family education;Balance training    OT Goals(Current goals can be found in the care plan section) Acute Rehab OT Goals Patient Stated Goal: none stated OT Goal Formulation: Patient unable to participate in goal setting Time For Goal Achievement: 11/15/20 Potential to Achieve Goals: Fair ADL  Goals Pt Will Perform Lower Body Bathing: with max assist;sit to/from stand Pt Will Transfer to Toilet: with min assist;with +2 assist;bedside commode;ambulating;stand pivot transfer Pt Will Perform Toileting - Clothing Manipulation and hygiene: with max assist;sit to/from stand Additional ADL Goal #1: Pt will complete bed mobility at mod A level to prepare for EOB/OOB ADLs. Additional ADL Goal #2: Pt will sit EOB at min guard level for 10 minutes to faciliate UB/EOB ADLs.  OT Frequency: Min 2X/week   Barriers to D/C:            Co-evaluation PT/OT/SLP Co-Evaluation/Treatment: Yes Reason for Co-Treatment: Necessary to address cognition/behavior during functional activity;For patient/therapist safety;To address functional/ADL transfers PT goals addressed during session: Mobility/safety with mobility;Balance OT goals addressed during session: ADL's and self-care;Strengthening/ROM      AM-PAC OT "6 Clicks" Daily Activity     Outcome Measure Help from another person eating meals?: Total Help from another person taking care of personal grooming?: Total Help from another person toileting, which includes using toliet, bedpan, or urinal?: Total Help from another person bathing (including washing, rinsing, drying)?: Total Help from another person to put on and taking off regular upper body  clothing?: Total Help from another person to put on and taking off regular lower body clothing?: Total 6 Click Score: 6   End of Session Equipment Utilized During Treatment: Oxygen (2L [place]) Nurse Communication: Other (comment);Mobility status (8/10 FACES pain)  Activity Tolerance: Patient limited by pain;Other (comment) (anxious and tearful) Patient left: in bed;with call bell/phone within reach;with bed alarm set;with SCD's reapplied  OT Visit Diagnosis: Unsteadiness on feet (R26.81);Other abnormalities of gait and mobility (R26.89);Repeated falls (R29.6);Muscle weakness (generalized) (M62.81);History of  falling (Z91.81);Other symptoms and signs involving cognitive function;Cognitive communication deficit (R41.841);Pain                Time: 8416-6063 OT Time Calculation (min): 17 min Charges:  OT General Charges $OT Visit: 1 Visit OT Evaluation $OT Eval Moderate Complexity: 1 Mod  Raynald Kemp, OT Acute Rehabilitation Services Pager: [phone] Office: [phone]   Pilar Grammes 11/01/2020, 11:48 AM

## 2020-11-01 NOTE — Progress Notes (Addendum)
PROGRESS NOTE        PATIENT DETAILS Name: Teresa Richard Age: 80 y.o. Sex: female Date of Birth: 1940/06/15 Admit Date: 10/26/2020 Admitting Physician Eduard Clos, MD RCB:ULAGT, Roderic Palau, MD  Brief Narrative: Patient is a 80 y.o. female with history of dementia, complete heart block-s/p PPM placement, hypothyroidism-presenting with a mechanical fall-found to have right hip fracture.  Significant events: 5/27>> admit for right hip fracture 6/1>> right hip hemiarthroplasty  Significant studies: 5/27>> x-ray right hip: Displaced right femoral neck fracture. 5/27>> x-ray right knee: No fracture  Antimicrobial therapy: None  Microbiology data: 5/27>> influenza/COVID PCR: Negative  Procedures : 6/1>> right hip hemiarthroplasty  Consults: Ortho  DVT Prophylaxis : SCDs Start: 10/31/20 1657   Subjective: Pleasantly confused-keeps on saying I am fine when asked what her name.  Appears comfortable and not in any distress.  Assessment/Plan: Right hip fracture: S/p right hip hemiarthroplasty-orthopedics recommending aspirin for VTE.  Orthopedics following-likely will need SNF.    Perioperative blood loss anemia: Appears asymptomatic at rest-stop IV fluids-repeat CBC tomorrow-if hemoglobin <7-May require transfusion  Mild AKI: Avoid nephrotoxic agents-we will gently hydrate-repeat electrolytes tomorrow.  Mild hyperkalemia: Treated with Lokelma-repeat electrolytes tomorrow  History of complete heart block-s/p PPM placement  Hypothyroidism: Continue Synthroid  History of dementia: At risk for delirium-continue to maintain delirium precautions.  She remains on Depakote/Seroquel.    Depression/anxiety: Continue Lexapro/BuSpar-and as needed Xanax  Normocytic anemia: Stable for follow-up-on iron supplementation  Vitamin B12 deficiency: On supplementation  Diet: Diet Order            Diet regular Room service appropriate? Yes; Fluid  consistency: Thin  Diet effective now                  Code Status:  DNR  Family Communication: Daughter-[name]-[phone] over the phone on 6/1  Disposition Plan: Status is: Inpatient  Remains inpatient appropriate because:Inpatient level of care appropriate due to severity of illness   Dispo: The patient is from: SNF              Anticipated d/c is to: SNF              Patient currently is not medically stable to d/c.   Difficult to place patient No   Barriers to Discharge: Right hip fracture-awaiting operative repair on 6/1  Antimicrobial agents: Anti-infectives (From admission, onward)   Start     Dose/Rate Route Frequency Ordered Stop   10/31/20 1845  ceFAZolin (ANCEF) IVPB 2g/100 mL premix        2 g 200 mL/hr over 30 Minutes Intravenous Every 6 hours 10/31/20 1656 11/01/20 0035   10/31/20 1228  ceFAZolin (ANCEF) 2-4 GM/100ML-% IVPB       Note to Pharmacy: Clovis Cao   : cabinet override      10/31/20 1228 11/01/20 0044   10/30/20 0700  ceFAZolin (ANCEF) IVPB 2g/100 mL premix  Status:  Discontinued        2 g 200 mL/hr over 30 Minutes Intravenous On call to O.R. 10/30/20 3646 10/30/20 1642       Time spent: 25- minutes-Greater than 50% of this time was spent in counseling, explanation of diagnosis, planning of further management, and coordination of care.  MEDICATIONS: Scheduled Meds: . aspirin EC  325 mg Oral Q breakfast  . busPIRone  10 mg Oral BID  .  busPIRone  7.5 mg Oral Q1500  . diclofenac Sodium  2 g Topical QID  . divalproex  125 mg Oral BID  . docusate sodium  100 mg Oral BID  . escitalopram  10 mg Oral Daily  . ferrous sulfate  325 mg Oral Q breakfast  . levothyroxine  75 mcg Oral Q0600  . melatonin  10 mg Oral QHS  . multivitamin with minerals  1 tablet Oral Daily  . Pimavanserin Tartrate  34 mg Oral Daily  . QUEtiapine  100 mg Oral QHS  . QUEtiapine  75 mg Oral q AM  . senna  1 tablet Oral BID  . sodium zirconium cyclosilicate  10  g Oral TID  . vitamin B-12  500 mcg Oral Daily   Continuous Infusions: . sodium chloride 50 mL/hr at 11/01/20 1022  . methocarbamol (ROBAXIN) IV     PRN Meds:.acetaminophen, ALPRAZolam, fentaNYL (SUBLIMAZE) injection, menthol-cetylpyridinium **OR** phenol, methocarbamol **OR** methocarbamol (ROBAXIN) IV, metoCLOPramide **OR** metoCLOPramide (REGLAN) injection, ondansetron **OR** ondansetron (ZOFRAN) IV, polyethylene glycol   PHYSICAL EXAM: Vital signs: Vitals:   10/31/20 1957 11/01/20 0023 11/01/20 0648 11/01/20 0825  BP: 94/69 (!) 103/53 (!) 149/121 (!) 128/114  Pulse: 80 82 78 88  Resp: 18 15 16    Temp: 97.9 F (36.6 C) 97.9 F (36.6 C) 98.3 F (36.8 C) 98.4 F (36.9 C)  TempSrc: Oral Oral Oral Axillary  SpO2: 100%  100% 100%  Weight:      Height:       Filed Weights   10/26/20 2059 10/29/20 1200  Weight: 59 kg 47.1 kg   Body mass index is 20.28 kg/m.   Gen Exam: Pleasantly confused-but not in any distress. HEENT:atraumatic, normocephalic Chest: B/L clear to auscultation anteriorly CVS:S1S2 regular Abdomen:soft non tender, non distended Extremities:no edema Neurology: Non focal Skin: no rash  I have personally reviewed following labs and imaging studies  LABORATORY DATA: CBC: Recent Labs  Lab 10/26/20 1954 10/27/20 0948 10/28/20 0418 10/31/20 0818 11/01/20 0253  WBC 5.5 5.7 4.6 5.4 12.3*  NEUTROABS 3.6  --   --   --   --   HGB 9.8* 9.9* 9.7* 10.4* 7.9*  HCT 31.5* 31.5* 31.5* 33.6* 25.8*  MCV 97.5 96.0 97.2 97.1 97.0  PLT 328 306 292 301 271    Basic Metabolic Panel: Recent Labs  Lab 10/26/20 1954 10/27/20 0948 10/28/20 0418 10/31/20 0818 11/01/20 0253  NA 143 142 141 145 142  K 4.4 3.7 4.3 4.3 5.7*  CL 102 105 104 102 99  CO2 32 29 34* 37* 34*  GLUCOSE 117* 97 98 99 170*  BUN 31* 28* 24* 35* 45*  CREATININE 0.75 0.68 0.67 0.76 1.36*  CALCIUM 9.2 9.1 9.2 9.6 9.3    GFR: Estimated Creatinine Clearance: 23.7 mL/min (A) (by C-G formula  based on SCr of 1.36 mg/dL (H)).  Liver Function Tests: No results for input(s): AST, ALT, ALKPHOS, BILITOT, PROT, ALBUMIN in the last 168 hours. No results for input(s): LIPASE, AMYLASE in the last 168 hours. No results for input(s): AMMONIA in the last 168 hours.  Coagulation Profile: Recent Labs  Lab 10/26/20 1954  INR 1.0    Cardiac Enzymes: No results for input(s): CKTOTAL, CKMB, CKMBINDEX, TROPONINI in the last 168 hours.  BNP (last 3 results) No results for input(s): PROBNP in the last 8760 hours.  Lipid Profile: No results for input(s): CHOL, HDL, LDLCALC, TRIG, CHOLHDL, LDLDIRECT in the last 72 hours.  Thyroid Function Tests: No results for input(s):  TSH, T4TOTAL, FREET4, T3FREE, THYROIDAB in the last 72 hours.  Anemia Panel: No results for input(s): VITAMINB12, FOLATE, FERRITIN, TIBC, IRON, RETICCTPCT in the last 72 hours.  Urine analysis:    Component Value Date/Time   COLORURINE YELLOW (A) 05/08/2020 0502   APPEARANCEUR HAZY (A) 05/08/2020 0502   LABSPEC 1.024 05/08/2020 0502   PHURINE 5.0 05/08/2020 0502   GLUCOSEU NEGATIVE 05/08/2020 0502   HGBUR NEGATIVE 05/08/2020 0502   BILIRUBINUR NEGATIVE 05/08/2020 0502   KETONESUR NEGATIVE 05/08/2020 0502   PROTEINUR NEGATIVE 05/08/2020 0502   NITRITE NEGATIVE 05/08/2020 0502   LEUKOCYTESUR NEGATIVE 05/08/2020 0502    Sepsis Labs: Lactic Acid, Venous No results found for: LATICACIDVEN  MICROBIOLOGY: Recent Results (from the past 240 hour(s))  Resp Panel by RT-PCR (Flu A&B, Covid) Nasopharyngeal Swab     Status: None   Collection Time: 10/26/20  7:26 PM   Specimen: Nasopharyngeal Swab; Nasopharyngeal(NP) swabs in vial transport medium  Result Value Ref Range Status   SARS Coronavirus 2 by RT PCR NEGATIVE NEGATIVE Final    Comment: (NOTE) SARS-CoV-2 target nucleic acids are NOT DETECTED.  The SARS-CoV-2 RNA is generally detectable in upper respiratory specimens during the acute phase of infection. The  lowest concentration of SARS-CoV-2 viral copies this assay can detect is 138 copies/mL. A negative result does not preclude SARS-Cov-2 infection and should not be used as the sole basis for treatment or other patient management decisions. A negative result may occur with  improper specimen collection/handling, submission of specimen other than nasopharyngeal swab, presence of viral mutation(s) within the areas targeted by this assay, and inadequate number of viral copies(<138 copies/mL). A negative result must be combined with clinical observations, patient history, and epidemiological information. The expected result is Negative.  Fact Sheet for Patients:  BloggerCourse.com  Fact Sheet for Healthcare Providers:  SeriousBroker.it  This test is no t yet approved or cleared by the [place] FDA and  has been authorized for detection and/or diagnosis of SARS-CoV-2 by FDA under an Emergency Use Authorization (EUA). This EUA will remain  in effect (meaning this test can be used) for the duration of the COVID-19 declaration under Section 564(b)(1) of the Act, 21 U.S.C.section 360bbb-3(b)(1), unless the authorization is terminated  or revoked sooner.       Influenza A by PCR NEGATIVE NEGATIVE Final   Influenza B by PCR NEGATIVE NEGATIVE Final    Comment: (NOTE) The Xpert Xpress SARS-CoV-2/FLU/RSV plus assay is intended as an aid in the diagnosis of influenza from Nasopharyngeal swab specimens and should not be used as a sole basis for treatment. Nasal washings and aspirates are unacceptable for Xpert Xpress SARS-CoV-2/FLU/RSV testing.  Fact Sheet for Patients: BloggerCourse.com  Fact Sheet for Healthcare Providers: SeriousBroker.it  This test is not yet approved or cleared by the [place] FDA and has been authorized for detection and/or diagnosis of SARS-CoV-2 by FDA under  an Emergency Use Authorization (EUA). This EUA will remain in effect (meaning this test can be used) for the duration of the COVID-19 declaration under Section 564(b)(1) of the Act, 21 U.S.C. section 360bbb-3(b)(1), unless the authorization is terminated or revoked.  Performed at [facility], [address], [place], [place] [address]   Surgical pcr screen     Status: None   Collection Time: 10/29/20  7:43 AM   Specimen: Nasal Mucosa; Nasal Swab  Result Value Ref Range Status   MRSA, PCR NEGATIVE NEGATIVE Final   Staphylococcus aureus NEGATIVE NEGATIVE Final  Comment: (NOTE) The Xpert SA Assay (FDA approved for NASAL specimens in patients 44 years of age and older), is one component of a comprehensive surveillance program. It is not intended to diagnose infection nor to guide or monitor treatment. Performed at [facility], [address] [place], [place] [address]     RADIOLOGY STUDIES/RESULTS: Pelvis Portable  Result Date: 10/31/2020 CLINICAL DATA:  80 year old female with fall and right hip fracture. EXAM: PORTABLE PELVIS 1-2 VIEWS COMPARISON:  Pelvic radiograph dated 10/26/2020. FINDINGS: There is a right hip arthroplasty. The arthroplasty appears intact. No new fracture. The bones are osteopenic. There is no dislocation. Postsurgical changes in the soft tissue of the right thigh. IMPRESSION: Status post right hip arthroplasty. Electronically Signed   By: Elgie Collard M.D.   On: 10/31/2020 16:42   DG C-Arm 1-60 Min  Result Date: 10/31/2020 CLINICAL DATA:  Provided history: Surgery, elective. Additional history provided: Total right hip arthroplasty with anterior approach. Provided fluoroscopy time: 7 seconds (0.39 mGy). EXAM: OPERATIVE right HIP (WITH PELVIS IF PERFORMED) 2 VIEWS TECHNIQUE: Fluoroscopic spot image(s) were submitted for interpretation post-operatively. COMPARISON:  Prior radiographs of the right hip 10/26/2020. FINDINGS: Two intraoperative  fluoroscopic images of the right hip are submitted. On the provided images, there are findings of interval right hip hemiarthroplasty. No unexpected finding on the provided views. The femoral component appears well seated. IMPRESSION: Two intraoperative fluoroscopic images of the right hip from right hip arthroplasty, as described. Electronically Signed   By: Jackey Loge DO   On: 10/31/2020 14:33   DG HIP OPERATIVE UNILAT WITH PELVIS RIGHT  Result Date: 10/31/2020 CLINICAL DATA:  Provided history: Surgery, elective. Additional history provided: Total right hip arthroplasty with anterior approach. Provided fluoroscopy time: 7 seconds (0.39 mGy). EXAM: OPERATIVE right HIP (WITH PELVIS IF PERFORMED) 2 VIEWS TECHNIQUE: Fluoroscopic spot image(s) were submitted for interpretation post-operatively. COMPARISON:  Prior radiographs of the right hip 10/26/2020. FINDINGS: Two intraoperative fluoroscopic images of the right hip are submitted. On the provided images, there are findings of interval right hip hemiarthroplasty. No unexpected finding on the provided views. The femoral component appears well seated. IMPRESSION: Two intraoperative fluoroscopic images of the right hip from right hip arthroplasty, as described. Electronically Signed   By: Jackey Loge DO   On: 10/31/2020 14:33     LOS: 6 days   Jeoffrey Massed, MD  [facility]    To contact the attending provider between 7A-7P or the covering provider during after hours 7P-7A, please log into the web site [email or web address] and access using universal [place] password for that web site. If you do not have the password, please call the hospital operator.  11/01/2020, 2:59 PM

## 2020-11-02 DIAGNOSIS — D519 Vitamin B12 deficiency anemia, unspecified: Secondary | ICD-10-CM | POA: Diagnosis not present

## 2020-11-02 DIAGNOSIS — W19XXXA Unspecified fall, initial encounter: Secondary | ICD-10-CM | POA: Diagnosis not present

## 2020-11-02 DIAGNOSIS — S72001A Fracture of unspecified part of neck of right femur, initial encounter for closed fracture: Secondary | ICD-10-CM | POA: Diagnosis not present

## 2020-11-02 DIAGNOSIS — E079 Disorder of thyroid, unspecified: Secondary | ICD-10-CM | POA: Diagnosis not present

## 2020-11-02 LAB — BASIC METABOLIC PANEL
Anion gap: 4 — ABNORMAL LOW (ref 5–15)
BUN: 61 mg/dL — ABNORMAL HIGH (ref 8–23)
CO2: 32 mmol/L (ref 22–32)
Calcium: 8.5 mg/dL — ABNORMAL LOW (ref 8.9–10.3)
Chloride: 104 mmol/L (ref 98–111)
Creatinine, Ser: 1.24 mg/dL — ABNORMAL HIGH (ref 0.44–1.00)
GFR, Estimated: 44 mL/min — ABNORMAL LOW (ref >60)
Glucose, Bld: 121 mg/dL — ABNORMAL HIGH (ref 70–99)
Potassium: 4 mmol/L (ref 3.5–5.1)
Sodium: 140 mmol/L (ref 135–145)

## 2020-11-02 LAB — CBC
HCT: 16.6 % — ABNORMAL LOW (ref 36.0–46.0)
Hemoglobin: 5.2 g/dL — CL (ref 12.0–15.0)
MCH: 29.7 pg (ref 26.0–34.0)
MCHC: 31.3 g/dL (ref 30.0–36.0)
MCV: 94.9 fL (ref 80.0–100.0)
Platelets: 155 10*3/uL (ref 150–400)
RBC: 1.75 MIL/uL — ABNORMAL LOW (ref 3.87–5.11)
RDW: 13.7 % (ref 11.5–15.5)
WBC: 6.6 10*3/uL (ref 4.0–10.5)
nRBC: 0 % (ref 0.0–0.2)

## 2020-11-02 LAB — HEMOGLOBIN AND HEMATOCRIT, BLOOD
HCT: 22.9 % — ABNORMAL LOW (ref 36.0–46.0)
Hemoglobin: 7.4 g/dL — ABNORMAL LOW (ref 12.0–15.0)

## 2020-11-02 LAB — PREPARE RBC (CROSSMATCH)

## 2020-11-02 MED ORDER — SODIUM CHLORIDE 0.9% IV SOLUTION: Freq: Once | INTRAVENOUS | Status: AC

## 2020-11-02 MED ORDER — DOCUSATE SODIUM 50 MG/5ML PO LIQD
100.0000 mg | Freq: Two times a day (BID) | ORAL | Status: DC
  Administered 2020-11-03 – 2020-11-06 (×6): 100 mg via ORAL
  Filled 2020-11-02 (×8): qty 10

## 2020-11-02 NOTE — Progress Notes (Signed)
Nurse got an order from attending to transfuse 2 unit of RBC, I explained to patient and she said yes although she as impaired memory and couldn't signed the consent, 2nd Unit is transfusing now, nurse try to call daughter Merdis Delay for confirmation of consent and also  to update her but it went straight to voicemail. Will notify incoming nurse to try again later and monitor patient

## 2020-11-02 NOTE — Progress Notes (Signed)
PROGRESS NOTE        PATIENT DETAILS Name: Teresa Richard Age: 80 y.o. Sex: female Date of Birth: Apr 06, 1941 Admit Date: 10/26/2020 Admitting Physician Eduard Clos, MD GUY:[id number], Roderic Palau, MD  Brief Narrative: Patient is a 80 y.o. female with history of dementia, complete heart block-s/p PPM placement, hypothyroidism-presenting with a mechanical fall-found to have right hip fracture.  Significant events: 5/27>> admit for right hip fracture 6/1>> right hip hemiarthroplasty 6/3>> hemoglobin 5.2-2 units of PRBC transfused  Significant studies: 5/27>> x-ray right hip: Displaced right femoral neck fracture. 5/27>> x-ray right knee: No fracture  Antimicrobial therapy: None  Microbiology data: 5/27>> influenza/COVID PCR: Negative  Procedures : 6/1>> right hip hemiarthroplasty  Consults: Ortho  DVT Prophylaxis : SCDs Start: 10/31/20 1657   Subjective: Lying comfortably in bed-getting second unit of PRBC transfusion earlier this morning when I saw her.  She is pleasantly confused.  Assessment/Plan: Right hip fracture: S/p right hip hemiarthroplasty-orthopedics recommending aspirin for VTE.  Orthopedics following-likely will need SNF.    Perioperative blood loss anemia: Significant drop in hemoglobin overnight-getting 2 units of PRBC today-repeat CBC and follow closely.  Mild AKI: Likely hemodynamically mediated-improving with supportive care.  Mild hyperkalemia: Resolved-no longer on Lokelma.  History of complete heart block-s/p PPM placement  Hypothyroidism: Continue Synthroid  History of dementia: At risk for delirium-continue to maintain delirium precautions.  She remains on Depakote/Seroquel.    Depression/anxiety: Continue Lexapro/BuSpar-and as needed Xanax  Normocytic anemia: Stable for follow-up-on iron supplementation  Vitamin B12 deficiency: On supplementation  Diet: Diet Order            Diet regular Room service  appropriate? Yes; Fluid consistency: Thin  Diet effective now                  Code Status:  DNR  Family Communication: Daughter-[name]-[phone] over the phone on 6/3  Disposition Plan: Status is: Inpatient  Remains inpatient appropriate because:Inpatient level of care appropriate due to severity of illness   Dispo: The patient is from: SNF              Anticipated d/c is to: SNF              Patient currently is not medically stable to d/c.   Difficult to place patient No   Barriers to Discharge: Right hip fracture-awaiting operative repair on 6/1  Antimicrobial agents: Anti-infectives (From admission, onward)   Start     Dose/Rate Route Frequency Ordered Stop   10/31/20 1845  ceFAZolin (ANCEF) IVPB 2g/100 mL premix        2 g 200 mL/hr over 30 Minutes Intravenous Every 6 hours 10/31/20 1656 11/01/20 0035   10/31/20 1228  ceFAZolin (ANCEF) 2-4 GM/100ML-% IVPB       Note to Pharmacy: Clovis Cao   : cabinet override      10/31/20 1228 11/01/20 0044   10/30/20 0700  ceFAZolin (ANCEF) IVPB 2g/100 mL premix  Status:  Discontinued        2 g 200 mL/hr over 30 Minutes Intravenous On call to O.R. 10/30/20 4259 10/30/20 1642       Time spent: 35- minutes-Greater than 50% of this time was spent in counseling, explanation of diagnosis, planning of further management, and coordination of care.  MEDICATIONS: Scheduled Meds: . aspirin EC  325 mg Oral Q breakfast  .  busPIRone  10 mg Oral BID  . busPIRone  7.5 mg Oral Q1500  . diclofenac Sodium  2 g Topical QID  . divalproex  125 mg Oral BID  . docusate  100 mg Oral BID  . escitalopram  10 mg Oral Daily  . ferrous sulfate  325 mg Oral Q breakfast  . levothyroxine  75 mcg Oral Q0600  . melatonin  10 mg Oral QHS  . multivitamin with minerals  1 tablet Oral Daily  . Pimavanserin Tartrate  34 mg Oral Daily  . QUEtiapine  100 mg Oral QHS  . QUEtiapine  75 mg Oral q AM  . senna  1 tablet Oral BID  . vitamin B-12  500  mcg Oral Daily   Continuous Infusions: . methocarbamol (ROBAXIN) IV     PRN Meds:.acetaminophen, ALPRAZolam, fentaNYL (SUBLIMAZE) injection, menthol-cetylpyridinium **OR** phenol, methocarbamol **OR** methocarbamol (ROBAXIN) IV, metoCLOPramide **OR** metoCLOPramide (REGLAN) injection, ondansetron **OR** ondansetron (ZOFRAN) IV, polyethylene glycol   PHYSICAL EXAM: Vital signs: Vitals:   11/02/20 0551 11/02/20 0618 11/02/20 0729 11/02/20 1013  BP: (!) 94/42 (!) 99/40 (!) 98/36 (!) 95/36  Pulse: 76 62 69 73  Resp: 18 18 18    Temp: 98.9 F (37.2 C) 98.9 F (37.2 C) 98.5 F (36.9 C) 98.4 F (36.9 C)  TempSrc: Axillary Oral Oral Axillary  SpO2: 98% 95% 93% 97%  Weight:      Height:       Filed Weights   10/26/20 2059 10/29/20 1200  Weight: 59 kg 47.1 kg   Body mass index is 20.28 kg/m.   Gen Exam: Pleasantly confused-but not in any distress. HEENT:atraumatic, normocephalic Chest: B/L clear to auscultation anteriorly CVS:S1S2 regular Abdomen:soft non tender, non distended Extremities:no edema Neurology: Non focal Skin: no rash  I have personally reviewed following labs and imaging studies  LABORATORY DATA: CBC: Recent Labs  Lab 10/26/20 1954 10/27/20 0948 10/28/20 0418 10/31/20 0818 11/01/20 0253 11/02/20 0124  WBC 5.5 5.7 4.6 5.4 12.3* 6.6  NEUTROABS 3.6  --   --   --   --   --   HGB 9.8* 9.9* 9.7* 10.4* 7.9* 5.2*  HCT 31.5* 31.5* 31.5* 33.6* 25.8* 16.6*  MCV 97.5 96.0 97.2 97.1 97.0 94.9  PLT 328 306 292 301 271 155    Basic Metabolic Panel: Recent Labs  Lab 10/27/20 0948 10/28/20 0418 10/31/20 0818 11/01/20 0253 11/02/20 0124  NA 142 141 145 142 140  K 3.7 4.3 4.3 5.7* 4.0  CL 105 104 102 99 104  CO2 29 34* 37* 34* 32  GLUCOSE 97 98 99 170* 121*  BUN 28* 24* 35* 45* 61*  CREATININE 0.68 0.67 0.76 1.36* 1.24*  CALCIUM 9.1 9.2 9.6 9.3 8.5*    GFR: Estimated Creatinine Clearance: 26 mL/min (A) (by C-G formula based on SCr of 1.24 mg/dL  (H)).  Liver Function Tests: No results for input(s): AST, ALT, ALKPHOS, BILITOT, PROT, ALBUMIN in the last 168 hours. No results for input(s): LIPASE, AMYLASE in the last 168 hours. No results for input(s): AMMONIA in the last 168 hours.  Coagulation Profile: Recent Labs  Lab 10/26/20 1954  INR 1.0    Cardiac Enzymes: No results for input(s): CKTOTAL, CKMB, CKMBINDEX, TROPONINI in the last 168 hours.  BNP (last 3 results) No results for input(s): PROBNP in the last 8760 hours.  Lipid Profile: No results for input(s): CHOL, HDL, LDLCALC, TRIG, CHOLHDL, LDLDIRECT in the last 72 hours.  Thyroid Function Tests: No results for input(s): TSH,  T4TOTAL, FREET4, T3FREE, THYROIDAB in the last 72 hours.  Anemia Panel: No results for input(s): VITAMINB12, FOLATE, FERRITIN, TIBC, IRON, RETICCTPCT in the last 72 hours.  Urine analysis:    Component Value Date/Time   COLORURINE YELLOW (A) 05/08/2020 0502   APPEARANCEUR HAZY (A) 05/08/2020 0502   LABSPEC 1.024 05/08/2020 0502   PHURINE 5.0 05/08/2020 0502   GLUCOSEU NEGATIVE 05/08/2020 0502   HGBUR NEGATIVE 05/08/2020 0502   BILIRUBINUR NEGATIVE 05/08/2020 0502   KETONESUR NEGATIVE 05/08/2020 0502   PROTEINUR NEGATIVE 05/08/2020 0502   NITRITE NEGATIVE 05/08/2020 0502   LEUKOCYTESUR NEGATIVE 05/08/2020 0502    Sepsis Labs: Lactic Acid, Venous No results found for: LATICACIDVEN  MICROBIOLOGY: Recent Results (from the past 240 hour(s))  Resp Panel by RT-PCR (Flu A&B, Covid) Nasopharyngeal Swab     Status: None   Collection Time: 10/26/20  7:26 PM   Specimen: Nasopharyngeal Swab; Nasopharyngeal(NP) swabs in vial transport medium  Result Value Ref Range Status   SARS Coronavirus 2 by RT PCR NEGATIVE NEGATIVE Final    Comment: (NOTE) SARS-CoV-2 target nucleic acids are NOT DETECTED.  The SARS-CoV-2 RNA is generally detectable in upper respiratory specimens during the acute phase of infection. The lowest concentration of  SARS-CoV-2 viral copies this assay can detect is 138 copies/mL. A negative result does not preclude SARS-Cov-2 infection and should not be used as the sole basis for treatment or other patient management decisions. A negative result may occur with  improper specimen collection/handling, submission of specimen other than nasopharyngeal swab, presence of viral mutation(s) within the areas targeted by this assay, and inadequate number of viral copies(<138 copies/mL). A negative result must be combined with clinical observations, patient history, and epidemiological information. The expected result is Negative.  Fact Sheet for Patients:  BloggerCourse.comhttps://www.fda.gov/media/152166/download  Fact Sheet for Healthcare Providers:  SeriousBroker.ithttps://www.fda.gov/media/152162/download  This test is no t yet approved or cleared by the [place] FDA and  has been authorized for detection and/or diagnosis of SARS-CoV-2 by FDA under an Emergency Use Authorization (EUA). This EUA will remain  in effect (meaning this test can be used) for the duration of the COVID-19 declaration under Section 564(b)(1) of the Act, 21 U.S.C.section 360bbb-3(b)(1), unless the authorization is terminated  or revoked sooner.       Influenza A by PCR NEGATIVE NEGATIVE Final   Influenza B by PCR NEGATIVE NEGATIVE Final    Comment: (NOTE) The Xpert Xpress SARS-CoV-2/FLU/RSV plus assay is intended as an aid in the diagnosis of influenza from Nasopharyngeal swab specimens and should not be used as a sole basis for treatment. Nasal washings and aspirates are unacceptable for Xpert Xpress SARS-CoV-2/FLU/RSV testing.  Fact Sheet for Patients: BloggerCourse.comhttps://www.fda.gov/media/152166/download  Fact Sheet for Healthcare Providers: SeriousBroker.ithttps://www.fda.gov/media/152162/download  This test is not yet approved or cleared by the [place] FDA and has been authorized for detection and/or diagnosis of SARS-CoV-2 by FDA under an Emergency Use  Authorization (EUA). This EUA will remain in effect (meaning this test can be used) for the duration of the COVID-19 declaration under Section 564(b)(1) of the Act, 21 U.S.C. section 360bbb-3(b)(1), unless the authorization is terminated or revoked.  Performed at [facility], [address] [place], [place] [address]   Surgical pcr screen     Status: None   Collection Time: 10/29/20  7:43 AM   Specimen: Nasal Mucosa; Nasal Swab  Result Value Ref Range Status   MRSA, PCR NEGATIVE NEGATIVE Final   Staphylococcus aureus NEGATIVE NEGATIVE Final  Comment: (NOTE) The Xpert SA Assay (FDA approved for NASAL specimens in patients 83 years of age and older), is one component of a comprehensive surveillance program. It is not intended to diagnose infection nor to guide or monitor treatment. Performed at [facility], [address] [place], [place] [address]     RADIOLOGY STUDIES/RESULTS: Pelvis Portable  Result Date: 10/31/2020 CLINICAL DATA:  80 year old female with fall and right hip fracture. EXAM: PORTABLE PELVIS 1-2 VIEWS COMPARISON:  Pelvic radiograph dated 10/26/2020. FINDINGS: There is a right hip arthroplasty. The arthroplasty appears intact. No new fracture. The bones are osteopenic. There is no dislocation. Postsurgical changes in the soft tissue of the right thigh. IMPRESSION: Status post right hip arthroplasty. Electronically Signed   By: Elgie Collard M.D.   On: 10/31/2020 16:42   DG C-Arm 1-60 Min  Result Date: 10/31/2020 CLINICAL DATA:  Provided history: Surgery, elective. Additional history provided: Total right hip arthroplasty with anterior approach. Provided fluoroscopy time: 7 seconds (0.39 mGy). EXAM: OPERATIVE right HIP (WITH PELVIS IF PERFORMED) 2 VIEWS TECHNIQUE: Fluoroscopic spot image(s) were submitted for interpretation post-operatively. COMPARISON:  Prior radiographs of the right hip 10/26/2020. FINDINGS: Two intraoperative fluoroscopic images of  the right hip are submitted. On the provided images, there are findings of interval right hip hemiarthroplasty. No unexpected finding on the provided views. The femoral component appears well seated. IMPRESSION: Two intraoperative fluoroscopic images of the right hip from right hip arthroplasty, as described. Electronically Signed   By: Jackey Loge DO   On: 10/31/2020 14:33   DG HIP OPERATIVE UNILAT WITH PELVIS RIGHT  Result Date: 10/31/2020 CLINICAL DATA:  Provided history: Surgery, elective. Additional history provided: Total right hip arthroplasty with anterior approach. Provided fluoroscopy time: 7 seconds (0.39 mGy). EXAM: OPERATIVE right HIP (WITH PELVIS IF PERFORMED) 2 VIEWS TECHNIQUE: Fluoroscopic spot image(s) were submitted for interpretation post-operatively. COMPARISON:  Prior radiographs of the right hip 10/26/2020. FINDINGS: Two intraoperative fluoroscopic images of the right hip are submitted. On the provided images, there are findings of interval right hip hemiarthroplasty. No unexpected finding on the provided views. The femoral component appears well seated. IMPRESSION: Two intraoperative fluoroscopic images of the right hip from right hip arthroplasty, as described. Electronically Signed   By: Jackey Loge DO   On: 10/31/2020 14:33     LOS: 7 days   Jeoffrey Massed, MD  [facility]    To contact the attending provider between 7A-7P or the covering provider during after hours 7P-7A, please log into the web site [email or web address] and access using universal [place] password for that web site. If you do not have the password, please call the hospital operator.  11/02/2020, 12:04 PM

## 2020-11-02 NOTE — Progress Notes (Signed)
Date and time results received: 11/02/20 @0250  (use smartphrase ".now" to insert current time)  Test: Hgb Critical Value: 5.2  Name of Provider Notified:[facility] ) paged Orders Received? Or Actions Taken?waiting for it.

## 2020-11-02 NOTE — Progress Notes (Addendum)
Updated Navi on facility- [employer] auth approved 11/03/20 - 11/06/20, next review date 11/06/20 Vesta Mixer [id number] Plan auth id [id number]

## 2020-11-02 NOTE — TOC Progression Note (Signed)
Transition of Care [facility]) - Progression Note    Patient Details  Name: Teresa Richard MRN: [id number] Date of Birth: Mar 18, 1941  [facility]) CM/SW Contact  Milinda Antis, [place] Phone Number: 11/02/2020, 2:03 PM  Clinical Narrative:    CSW met with the patient's daughter and presented bed offers.  [facility] is the agency of choice.   CSW spoke with Magda Paganini at [facility].  They have agreed to take the patient, pending she does well over the weekend and does not have any behaviors.  The family has been notified.   Expected Discharge Plan: [place] Barriers to Discharge: Continued Medical Work up,Insurance Authorization,SNF Pending bed offer  Expected Discharge Plan and Services Expected Discharge Plan: [place] arrangements for the past 2 months: Assisted Living Facility                                       Social Determinants of Health (SDOH) Interventions    Readmission Risk Interventions No flowsheet data found.

## 2020-11-03 DIAGNOSIS — S72001A Fracture of unspecified part of neck of right femur, initial encounter for closed fracture: Secondary | ICD-10-CM | POA: Diagnosis not present

## 2020-11-03 DIAGNOSIS — D519 Vitamin B12 deficiency anemia, unspecified: Secondary | ICD-10-CM | POA: Diagnosis not present

## 2020-11-03 LAB — BPAM RBC
Blood Product Expiration Date: 202206042359
Blood Product Expiration Date: 202206252359
ISSUE DATE / TIME: 202206030318
ISSUE DATE / TIME: 202206030545
Unit Type and Rh: 5100
Unit Type and Rh: 5100

## 2020-11-03 LAB — TYPE AND SCREEN
ABO/RH(D): O POS
Antibody Screen: NEGATIVE
Unit division: 0
Unit division: 0

## 2020-11-03 LAB — CBC
HCT: 24 % — ABNORMAL LOW (ref 36.0–46.0)
Hemoglobin: 7.9 g/dL — ABNORMAL LOW (ref 12.0–15.0)
MCH: 30.9 pg (ref 26.0–34.0)
MCHC: 32.9 g/dL (ref 30.0–36.0)
MCV: 93.8 fL (ref 80.0–100.0)
Platelets: 162 10*3/uL (ref 150–400)
RBC: 2.56 MIL/uL — ABNORMAL LOW (ref 3.87–5.11)
RDW: 15.1 % (ref 11.5–15.5)
WBC: 6.1 10*3/uL (ref 4.0–10.5)
nRBC: 0 % (ref 0.0–0.2)

## 2020-11-03 LAB — BASIC METABOLIC PANEL
Anion gap: 6 (ref 5–15)
BUN: 47 mg/dL — ABNORMAL HIGH (ref 8–23)
CO2: 34 mmol/L — ABNORMAL HIGH (ref 22–32)
Calcium: 8.8 mg/dL — ABNORMAL LOW (ref 8.9–10.3)
Chloride: 102 mmol/L (ref 98–111)
Creatinine, Ser: 0.83 mg/dL (ref 0.44–1.00)
GFR, Estimated: >60 mL/min (ref >60)
Glucose, Bld: 107 mg/dL — ABNORMAL HIGH (ref 70–99)
Potassium: 3.6 mmol/L (ref 3.5–5.1)
Sodium: 142 mmol/L (ref 135–145)

## 2020-11-03 IMAGING — CR DG ABDOMEN 1V
1 series · 2 of 2 positions shown · non-contrast
Comparison: None.

CLINICAL DATA: Abdominal pain, constipation

EXAM:
ABDOMEN - 1 VIEW

[Series 1: dg abd 1 view · 0.14mm/px · 2 of 2 slices shown]
[im 1/2]
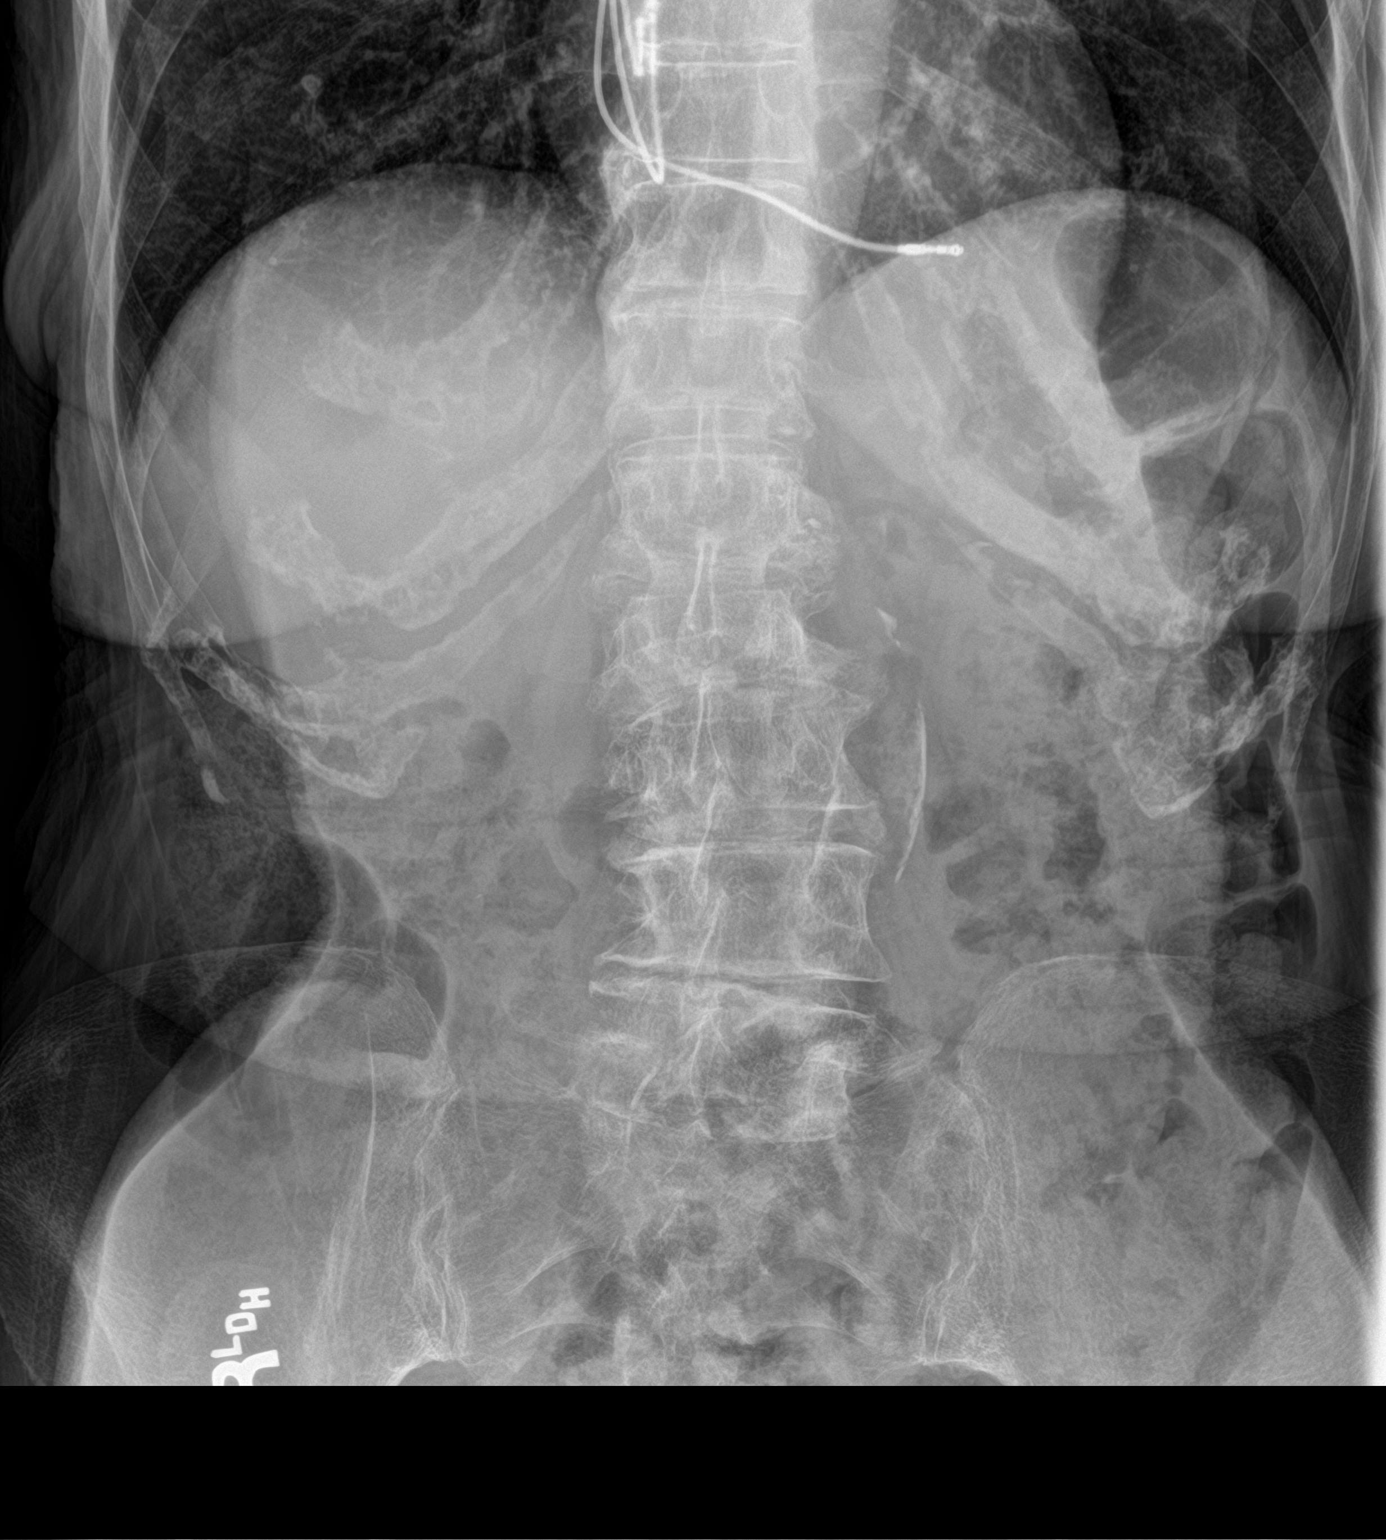
[im 2/2]
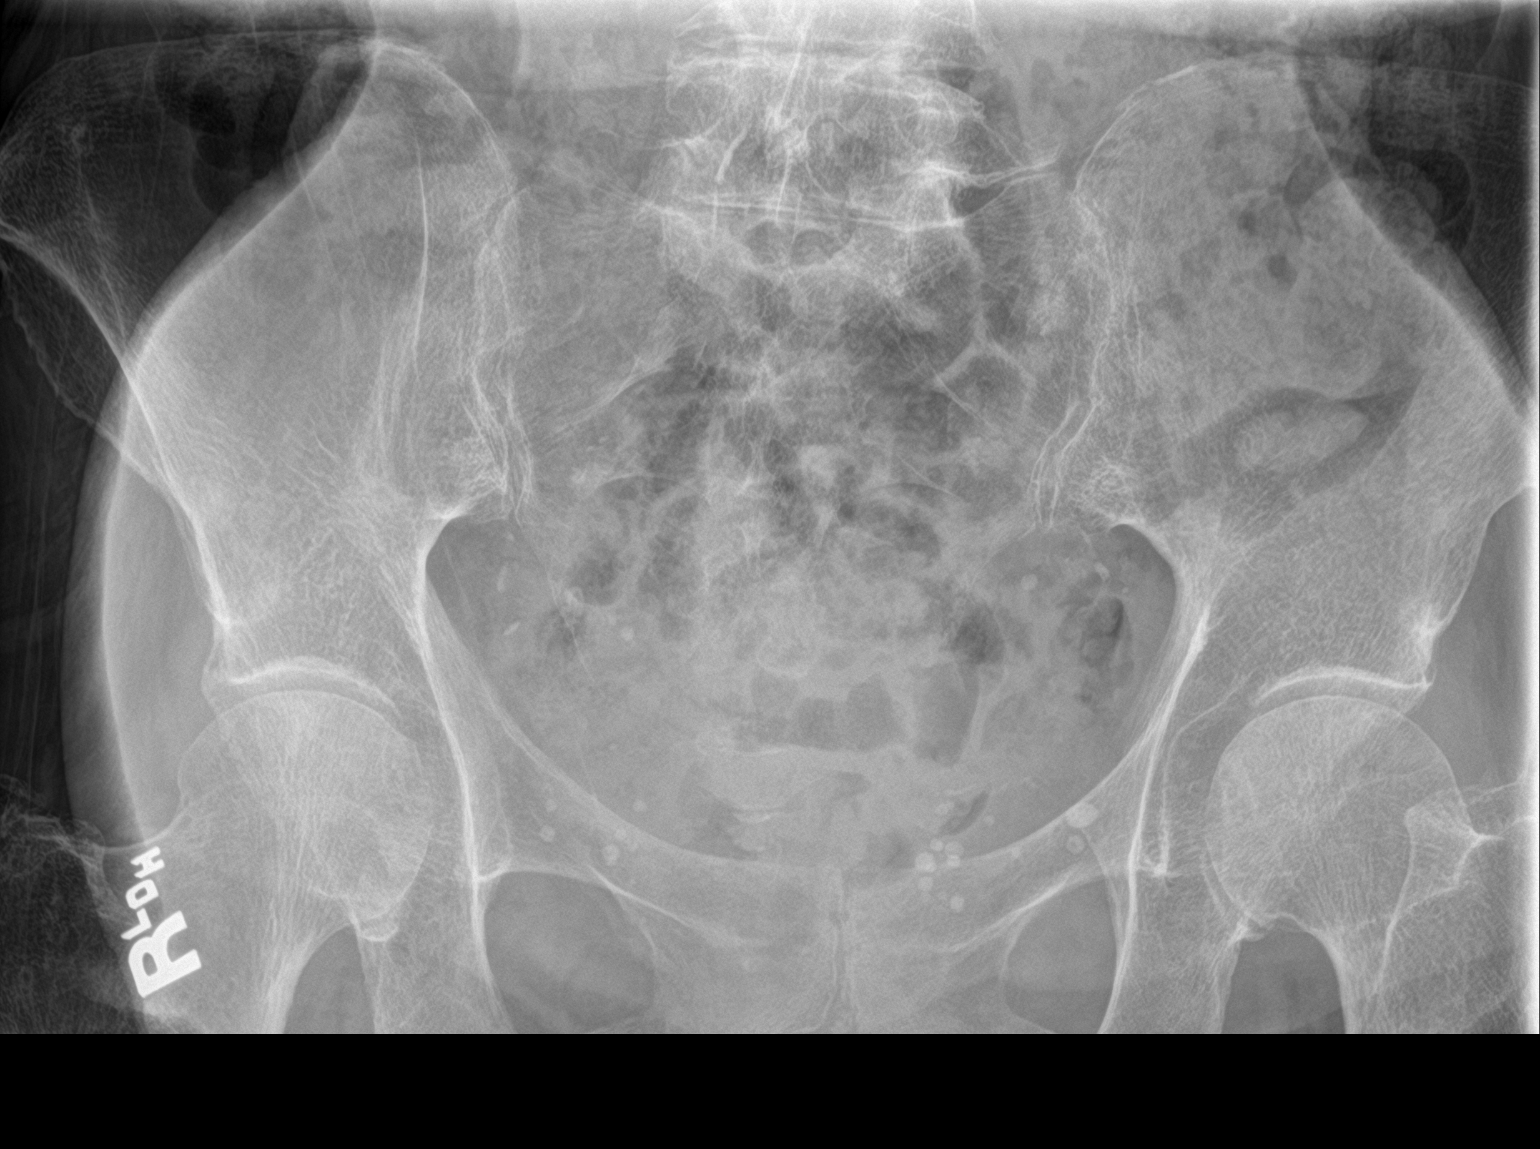

[2 of 2 positions shown; findings below may reference images not displayed]

FINDINGS: Supine views of the abdomen show no bowel obstruction. There is
moderate to large amount of feces throughout the colon however. No
opaque calculi are seen. There is some calcification of the
descending thoracic aorta but no dilatation is evident on the images
obtained. The bones are osteopenic and there are degenerative
changes in the lumbar spine with probable mild compression of L2
vertebral body, most likely old.
IMPRESSION: 1. Large amount of feces throughout the colon consistent with the
clinical diagnosis of constipation. No bowel obstruction.
2. Osteopenia and diffuse degenerative change of the lumbar spine.
Probable compression deformity of L2 most likely old.

## 2020-11-03 NOTE — Progress Notes (Signed)
PROGRESS NOTE  Teresa Richard [id number] DOB: Feb 25, 1941 DOA: 10/26/2020 PCP: Barbette Reichmann, MD   LOS: 8 days   Brief Narrative / Interim history: Patient is a 80 y.o. female with history of dementia, complete heart block-s/p PPM placement, hypothyroidism-presenting with a mechanical fall-found to have right hip fracture.   Significant events: 5/27>> admit for right hip fracture 6/1>> right hip hemiarthroplasty 6/3>> hemoglobin 5.2-2 units of PRBC transfused  Subjective / 24h Interval events: Underlying dementia, no significant complaints for me  Assessment & Plan: Principal Problem Right hip fracture: S/p right hip hemiarthroplasty-orthopedics recommending aspirin for VTE.  Orthopedics following-likely will need SNF.    Active Problems Perioperative blood loss anemia: Significant drop in hemoglobin overnight 6/3, status post unit of packed red blood cells.  Hemoglobin improved appropriately this morning, continue to monitor.  Mild AKI: Likely hemodynamically mediated-improving with supportive care.  Mild hyperkalemia: Resolved-no longer on Lokelma.  History of complete heart block-s/p PPM placement  Hypothyroidism: Continue Synthroid  History of dementia: At risk for delirium-continue to maintain delirium precautions.  She remains on Depakote/Seroquel.    Depression/anxiety: Continue Lexapro/BuSpar-and as needed Xanax  Normocytic anemia: Stable for follow-up-on iron supplementation  Vitamin B12 deficiency: On supplementation  Scheduled Meds: . aspirin EC  325 mg Oral Q breakfast  . busPIRone  10 mg Oral BID  . busPIRone  7.5 mg Oral Q1500  . diclofenac Sodium  2 g Topical QID  . divalproex  125 mg Oral BID  . docusate  100 mg Oral BID  . escitalopram  10 mg Oral Daily  . ferrous sulfate  325 mg Oral Q breakfast  . levothyroxine  75 mcg Oral Q0600  . melatonin  10 mg Oral QHS  . multivitamin with minerals  1 tablet Oral Daily  . Pimavanserin Tartrate   34 mg Oral Daily  . QUEtiapine  100 mg Oral QHS  . QUEtiapine  75 mg Oral q AM  . senna  1 tablet Oral BID  . vitamin B-12  500 mcg Oral Daily   Continuous Infusions: . methocarbamol (ROBAXIN) IV     PRN Meds:.acetaminophen, ALPRAZolam, fentaNYL (SUBLIMAZE) injection, menthol-cetylpyridinium **OR** phenol, methocarbamol **OR** methocarbamol (ROBAXIN) IV, metoCLOPramide **OR** metoCLOPramide (REGLAN) injection, ondansetron **OR** ondansetron (ZOFRAN) IV, polyethylene glycol  Diet Orders (From admission, onward)    Start     Ordered   10/31/20 1657  Diet regular Room service appropriate? Yes; Fluid consistency: Thin  Diet effective now       Question Answer Comment  Room service appropriate? Yes   Fluid consistency: Thin      10/31/20 1656          DVT prophylaxis: SCDs Start: 10/31/20 1657     Code Status: DNR  Family Communication: No family at bedside  Status is: Inpatient  Remains inpatient appropriate because:Inpatient level of care appropriate due to severity of illness   Dispo: The patient is from: Home              Anticipated d/c is to: SNF              Patient currently is not medically stable to d/c.   Difficult to place patient No   Level of care: Telemetry Medical  Consultants:  Diabetes surgeries  Microbiology  none  Antimicrobials: none    Objective: Vitals:   11/02/20 1540 11/02/20 2004 11/03/20 0405 11/03/20 0824  BP: (!) 103/49 (!) 107/46 (!) 100/44 (!) 112/55  Pulse: 84 70 65 63  Resp: 20  18 18 14   Temp: 98.9 F (37.2 C) 98.1 F (36.7 C) 97.7 F (36.5 C) 98.7 F (37.1 C)  TempSrc:  Oral Oral Oral  SpO2: 97% 100% 97% 99%  Weight:      Height:        Intake/Output Summary (Last 24 hours) at 11/03/2020 0959 Last data filed at 11/02/2020 1300 Gross per 24 hour  Intake 628 ml  Output --  Net 628 ml   Filed Weights   10/26/20 2059 10/29/20 1200  Weight: 59 kg 47.1 kg    Examination:  Constitutional: NAD Eyes: no scleral  icterus ENMT: Mucous membranes are moist.  Neck: normal, supple Respiratory: clear to auscultation bilaterally, no wheezing, no crackles. Normal respiratory effort. Cardiovascular: Regular rate and rhythm, no murmurs / rubs / gallops. No LE edema.  Abdomen: non distended, no tenderness. Bowel sounds positive.  Musculoskeletal: no clubbing / cyanosis.  Skin: no rashes Neurologic: Nonfocal  Data Reviewed: I have independently reviewed following labs and imaging studies   CBC: Recent Labs  Lab 10/28/20 0418 10/31/20 0818 11/01/20 0253 11/02/20 0124 11/02/20 1423 11/03/20 0149  WBC 4.6 5.4 12.3* 6.6  --  6.1  HGB 9.7* 10.4* 7.9* 5.2* 7.4* 7.9*  HCT 31.5* 33.6* 25.8* 16.6* 22.9* 24.0*  MCV 97.2 97.1 97.0 94.9  --  93.8  PLT 292 301 271 155  --  162   Basic Metabolic Panel: Recent Labs  Lab 10/28/20 0418 10/31/20 0818 11/01/20 0253 11/02/20 0124 11/03/20 0149  NA 141 145 142 140 142  K 4.3 4.3 5.7* 4.0 3.6  CL 104 102 99 104 102  CO2 34* 37* 34* 32 34*  GLUCOSE 98 99 170* 121* 107*  BUN 24* 35* 45* 61* 47*  CREATININE 0.67 0.76 1.36* 1.24* 0.83  CALCIUM 9.2 9.6 9.3 8.5* 8.8*   Liver Function Tests: No results for input(s): AST, ALT, ALKPHOS, BILITOT, PROT, ALBUMIN in the last 168 hours. Coagulation Profile: No results for input(s): INR, PROTIME in the last 168 hours. HbA1C: No results for input(s): HGBA1C in the last 72 hours. CBG: No results for input(s): GLUCAP in the last 168 hours.  Recent Results (from the past 240 hour(s))  Resp Panel by RT-PCR (Flu A&B, Covid) Nasopharyngeal Swab     Status: None   Collection Time: 10/26/20  7:26 PM   Specimen: Nasopharyngeal Swab; Nasopharyngeal(NP) swabs in vial transport medium  Result Value Ref Range Status   SARS Coronavirus 2 by RT PCR NEGATIVE NEGATIVE Final    Comment: (NOTE) SARS-CoV-2 target nucleic acids are NOT DETECTED.  The SARS-CoV-2 RNA is generally detectable in upper respiratory specimens during the  acute phase of infection. The lowest concentration of SARS-CoV-2 viral copies this assay can detect is 138 copies/mL. A negative result does not preclude SARS-Cov-2 infection and should not be used as the sole basis for treatment or other patient management decisions. A negative result may occur with  improper specimen collection/handling, submission of specimen other than nasopharyngeal swab, presence of viral mutation(s) within the areas targeted by this assay, and inadequate number of viral copies(<138 copies/mL). A negative result must be combined with clinical observations, patient history, and epidemiological information. The expected result is Negative.  Fact Sheet for Patients:  10/28/20  Fact Sheet for Healthcare Providers:  BloggerCourse.com  This test is no t yet approved or cleared by the [employer]iousBro[employer].[place] FDA and  has been authorized for detection and/or diagnosis of SARS-CoV-2 by FDA under an Emergency Use Authorization (EUA). This  EUA will remain  in effect (meaning this test can be used) for the duration of the COVID-19 declaration under Section 564(b)(1) of the Act, 21 U.S.C.section 360bbb-3(b)(1), unless the authorization is terminated  or revoked sooner.       Influenza A by PCR NEGATIVE NEGATIVE Final   Influenza B by PCR NEGATIVE NEGATIVE Final    Comment: (NOTE) The Xpert Xpress SARS-CoV-2/FLU/RSV plus assay is intended as an aid in the diagnosis of influenza from Nasopharyngeal swab specimens and should not be used as a sole basis for treatment. Nasal washings and aspirates are unacceptable for Xpert Xpress SARS-CoV-2/FLU/RSV testing.  Fact Sheet for Patients: BloggerCourse.com  Fact Sheet for Healthcare Providers: SeriousBroker.it  This test is not yet approved or cleared by the [place] FDA and has been authorized for detection and/or  diagnosis of SARS-CoV-2 by FDA under an Emergency Use Authorization (EUA). This EUA will remain in effect (meaning this test can be used) for the duration of the COVID-19 declaration under Section 564(b)(1) of the Act, 21 U.S.C. section 360bbb-3(b)(1), unless the authorization is terminated or revoked.  Performed at [facility], [address], [place], [place] [address]   Surgical pcr screen     Status: None   Collection Time: 10/29/20  7:43 AM   Specimen: Nasal Mucosa; Nasal Swab  Result Value Ref Range Status   MRSA, PCR NEGATIVE NEGATIVE Final   Staphylococcus aureus NEGATIVE NEGATIVE Final    Comment: (NOTE) The Xpert SA Assay (FDA approved for NASAL specimens in patients 58 years of age and older), is one component of a comprehensive surveillance program. It is not intended to diagnose infection nor to guide or monitor treatment. Performed at [facility], [address], [place], [place] [address]      Radiology Studies: No results found.   Pamella Pert, MD, PhD [facility]  Between 7 am - 7 pm I am available, please contact me via Amion (for emergencies) or Securechat (non urgent messages)  Between 7 pm - 7 am I am not available, please contact night coverage MD/APP via Amion

## 2020-11-03 NOTE — TOC Progression Note (Signed)
Transition of Care [facility]) - Progression Note    Patient Details  Name: Teresa Richard MRN: [id number] Date of Birth: February 26, 1941  Transition of Care [facility]) CM/SW Contact  Terrial Rhodes, LCSWA Phone Number: 11/03/2020, 12:02 PM  Clinical Narrative:     CSW spoke with Judeth Cornfield at [facility]. She confirmed that TOC will need to check back in with facility on Monday to see if they can review and accept patient for SNF placement. CSW informed MD. CSW will continue to follow and assist with discharge planning needs.   Expected Discharge Plan: Skilled Nursing Facility Barriers to Discharge: Continued Medical Work up,Insurance Authorization,SNF Pending bed offer  Expected Discharge Plan and Services Expected Discharge Plan: Skilled Nursing Facility       Living arrangements for the past 2 months: Assisted Living Facility                                       Social Determinants of Health (SDOH) Interventions    Readmission Risk Interventions No flowsheet data found.

## 2020-11-04 DIAGNOSIS — D519 Vitamin B12 deficiency anemia, unspecified: Secondary | ICD-10-CM | POA: Diagnosis not present

## 2020-11-04 DIAGNOSIS — S72001A Fracture of unspecified part of neck of right femur, initial encounter for closed fracture: Secondary | ICD-10-CM | POA: Diagnosis not present

## 2020-11-04 LAB — BASIC METABOLIC PANEL
Anion gap: 4 — ABNORMAL LOW (ref 5–15)
BUN: 29 mg/dL — ABNORMAL HIGH (ref 8–23)
CO2: 32 mmol/L (ref 22–32)
Calcium: 9 mg/dL (ref 8.9–10.3)
Chloride: 108 mmol/L (ref 98–111)
Creatinine, Ser: 0.59 mg/dL (ref 0.44–1.00)
GFR, Estimated: >60 mL/min (ref >60)
Glucose, Bld: 107 mg/dL — ABNORMAL HIGH (ref 70–99)
Potassium: 4 mmol/L (ref 3.5–5.1)
Sodium: 144 mmol/L (ref 135–145)

## 2020-11-04 LAB — CBC
HCT: 28.1 % — ABNORMAL LOW (ref 36.0–46.0)
Hemoglobin: 9.1 g/dL — ABNORMAL LOW (ref 12.0–15.0)
MCH: 30.6 pg (ref 26.0–34.0)
MCHC: 32.4 g/dL (ref 30.0–36.0)
MCV: 94.6 fL (ref 80.0–100.0)
Platelets: 211 10*3/uL (ref 150–400)
RBC: 2.97 MIL/uL — ABNORMAL LOW (ref 3.87–5.11)
RDW: 14.2 % (ref 11.5–15.5)
WBC: 6.1 10*3/uL (ref 4.0–10.5)
nRBC: 0 % (ref 0.0–0.2)

## 2020-11-04 LAB — SARS CORONAVIRUS 2 (TAT 6-24 HRS): SARS Coronavirus 2: NEGATIVE

## 2020-11-04 NOTE — Progress Notes (Signed)
PROGRESS NOTE  Teresa Richard [id number] DOB: [birth year] DOA: 10/26/2020 PCP: Barbette Reichmann, MD   LOS: 9 days   Brief Narrative / Interim history: Patient is a 80 y.o. female with history of dementia, complete heart block-s/p PPM placement, hypothyroidism-presenting with a mechanical fall-found to have right hip fracture.  Significant events: 5/27>> admit for right hip fracture 6/1>> right hip hemiarthroplasty 6/3>> hemoglobin 5.2-2 units of PRBC transfused  Subjective / 24h Interval events: No complaints, confused  Assessment & Plan: Principal Problem Right hip fracture: S/p right hip hemiarthroplasty-orthopedics recommending aspirin for VTE.  Orthopedics following-likely will need SNF.    Discussed with Child psychotherapist, plan for discharge on Monday as SNF cannot admit her on weekend -Repeat Covid today  Active Problems Perioperative blood loss anemia: Significant drop in hemoglobin overnight 6/3, status post unit of packed red blood cells.  Hemoglobin remained stable, no bleeding  Mild AKI: Likely hemodynamically mediated-improving with supportive care.  Mild hyperkalemia: Resolved-no longer on Lokelma.  History of complete heart block-s/p PPM placement  Hypothyroidism: Continue Synthroid  History of dementia: At risk for delirium-continue to maintain delirium precautions.  She remains on Depakote/Seroquel.    Depression/anxiety: Continue Lexapro/BuSpar-and as needed Xanax  Normocytic anemia: Stable for follow-up-on iron supplementation  Vitamin B12 deficiency: On supplementation  Scheduled Meds: . aspirin EC  325 mg Oral Q breakfast  . busPIRone  10 mg Oral BID  . busPIRone  7.5 mg Oral Q1500  . diclofenac Sodium  2 g Topical QID  . divalproex  125 mg Oral BID  . docusate  100 mg Oral BID  . escitalopram  10 mg Oral Daily  . ferrous sulfate  325 mg Oral Q breakfast  . levothyroxine  75 mcg Oral Q0600  . melatonin  10 mg Oral QHS  . multivitamin with  minerals  1 tablet Oral Daily  . Pimavanserin Tartrate  34 mg Oral Daily  . QUEtiapine  100 mg Oral QHS  . QUEtiapine  75 mg Oral q AM  . senna  1 tablet Oral BID  . vitamin B-12  500 mcg Oral Daily   Continuous Infusions: . methocarbamol (ROBAXIN) IV     PRN Meds:.acetaminophen, ALPRAZolam, fentaNYL (SUBLIMAZE) injection, menthol-cetylpyridinium **OR** phenol, methocarbamol **OR** methocarbamol (ROBAXIN) IV, metoCLOPramide **OR** metoCLOPramide (REGLAN) injection, ondansetron **OR** ondansetron (ZOFRAN) IV, polyethylene glycol  Diet Orders (From admission, onward)    Start     Ordered   10/31/20 1657  Diet regular Room service appropriate? Yes; Fluid consistency: Thin  Diet effective now       Question Answer Comment  Room service appropriate? Yes   Fluid consistency: Thin      10/31/20 1656          DVT prophylaxis: SCDs Start: 10/31/20 1657     Code Status: DNR  Family Communication: No family at bedside, discussed with daughter over the phone  Status is: Inpatient  Remains inpatient appropriate because:Inpatient level of care appropriate due to severity of illness   Dispo: The patient is from: Home              Anticipated d/c is to: SNF              Patient currently is not medically stable to d/c.   Difficult to place patient No   Level of care: Telemetry Medical  Consultants:  Diabetes surgeries  Microbiology  none  Antimicrobials: none    Objective: Vitals:   11/03/20 0824 11/03/20 1451 11/03/20 1928 11/04/20 0802  BP: (!) 112/55 (!) 105/91 103/71 (!) 124/47  Pulse: 63 63 85 68  Resp: 14 14 20 14   Temp: 98.7 F (37.1 C) 98.2 F (36.8 C) 98.7 F (37.1 C) 97.6 F (36.4 C)  TempSrc: Oral Oral  Oral  SpO2: 99% 100% 97% 95%  Weight:      Height:        Intake/Output Summary (Last 24 hours) at 11/04/2020 1008 Last data filed at 11/03/2020 1614 Gross per 24 hour  Intake 120 ml  Output 100 ml  Net 20 ml   Filed Weights   10/26/20 2059  10/29/20 1200  Weight: 59 kg 47.1 kg    Examination:  Constitutional: She is in no distress Eyes: No icterus ENMT: mmm Neck: normal, supple Respiratory: Clear bilaterally without wheezing or crackles Cardiovascular: Regular rate and rhythm no murmurs, no peripheral edema Abdomen: Nontender, nondistended, bowel sounds positive Musculoskeletal: no clubbing / cyanosis.  Skin: No rashes seen Neurologic: No focal deficits  Data Reviewed: I have independently reviewed following labs and imaging studies   CBC: Recent Labs  Lab 10/31/20 0818 11/01/20 0253 11/02/20 0124 11/02/20 1423 11/03/20 0149 11/04/20 0710  WBC 5.4 12.3* 6.6  --  6.1 6.1  HGB 10.4* 7.9* 5.2* 7.4* 7.9* 9.1*  HCT 33.6* 25.8* 16.6* 22.9* 24.0* 28.1*  MCV 97.1 97.0 94.9  --  93.8 94.6  PLT 301 271 155  --  162 211   Basic Metabolic Panel: Recent Labs  Lab 10/31/20 0818 11/01/20 0253 11/02/20 0124 11/03/20 0149 11/04/20 0710  NA 145 142 140 142 144  K 4.3 5.7* 4.0 3.6 4.0  CL 102 99 104 102 108  CO2 37* 34* 32 34* 32  GLUCOSE 99 170* 121* 107* 107*  BUN 35* 45* 61* 47* 29*  CREATININE 0.76 1.36* 1.24* 0.83 0.59  CALCIUM 9.6 9.3 8.5* 8.8* 9.0   Liver Function Tests: No results for input(s): AST, ALT, ALKPHOS, BILITOT, PROT, ALBUMIN in the last 168 hours. Coagulation Profile: No results for input(s): INR, PROTIME in the last 168 hours. HbA1C: No results for input(s): HGBA1C in the last 72 hours. CBG: No results for input(s): GLUCAP in the last 168 hours.  Recent Results (from the past 240 hour(s))  Resp Panel by RT-PCR (Flu A&B, Covid) Nasopharyngeal Swab     Status: None   Collection Time: 10/26/20  7:26 PM   Specimen: Nasopharyngeal Swab; Nasopharyngeal(NP) swabs in vial transport medium  Result Value Ref Range Status   SARS Coronavirus 2 by RT PCR NEGATIVE NEGATIVE Final    Comment: (NOTE) SARS-CoV-2 target nucleic acids are NOT DETECTED.  The SARS-CoV-2 RNA is generally detectable in  upper respiratory specimens during the acute phase of infection. The lowest concentration of SARS-CoV-2 viral copies this assay can detect is 138 copies/mL. A negative result does not preclude SARS-Cov-2 infection and should not be used as the sole basis for treatment or other patient management decisions. A negative result may occur with  improper specimen collection/handling, submission of specimen other than nasopharyngeal swab, presence of viral mutation(s) within the areas targeted by this assay, and inadequate number of viral copies(<138 copies/mL). A negative result must be combined with clinical observations, patient history, and epidemiological information. The expected result is Negative.  Fact Sheet for Patients:  10/28/20  Fact Sheet for Healthcare Providers:  BloggerCourse.com  This test is no t yet approved or cleared by the [employer] FDA and  has been authorized for detection and/or diagnosis of SARS-CoV-2 by FDA  under an Emergency Use Authorization (EUA). This EUA will remain  in effect (meaning this test can be used) for the duration of the COVID-19 declaration under Section 564(b)(1) of the Act, 21 U.S.C.section 360bbb-3(b)(1), unless the authorization is terminated  or revoked sooner.       Influenza A by PCR NEGATIVE NEGATIVE Final   Influenza B by PCR NEGATIVE NEGATIVE Final    Comment: (NOTE) The Xpert Xpress SARS-CoV-2/FLU/RSV plus assay is intended as an aid in the diagnosis of influenza from Nasopharyngeal swab specimens and should not be used as a sole basis for treatment. Nasal washings and aspirates are unacceptable for Xpert Xpress SARS-CoV-2/FLU/RSV testing.  Fact Sheet for Patients: BloggerCourse.com  Fact Sheet for Healthcare Providers: SeriousBroker.it  This test is not yet approved or cleared by the [place] FDA and has been  authorized for detection and/or diagnosis of SARS-CoV-2 by FDA under an Emergency Use Authorization (EUA). This EUA will remain in effect (meaning this test can be used) for the duration of the COVID-19 declaration under Section 564(b)(1) of the Act, 21 U.S.C. section 360bbb-3(b)(1), unless the authorization is terminated or revoked.  Performed at [facility], [address] [place], [place] [address]   Surgical pcr screen     Status: None   Collection Time: 10/29/20  7:43 AM   Specimen: Nasal Mucosa; Nasal Swab  Result Value Ref Range Status   MRSA, PCR NEGATIVE NEGATIVE Final   Staphylococcus aureus NEGATIVE NEGATIVE Final    Comment: (NOTE) The Xpert SA Assay (FDA approved for NASAL specimens in patients 63 years of age and older), is one component of a comprehensive surveillance program. It is not intended to diagnose infection nor to guide or monitor treatment. Performed at [facility], [address], [place], [place] [address]      Radiology Studies: No results found.   Pamella Pert, MD, PhD [facility]  Between 7 am - 7 pm I am available, please contact me via Amion (for emergencies) or Securechat (non urgent messages)  Between 7 pm - 7 am I am not available, please contact night coverage MD/APP via Amion

## 2020-11-04 NOTE — Plan of Care (Signed)

## 2020-11-05 MED ORDER — ALPRAZOLAM 0.5 MG PO TABS: 0.5000 mg | ORAL_TABLET | Freq: Two times a day (BID) | ORAL | 0 refills | Status: AC | PRN

## 2020-11-05 MED ORDER — COVID-19 MRNA VACC (MODERNA) 50 MCG/0.25ML IM SUSP
0.2500 mL | Freq: Once | INTRAMUSCULAR | Status: AC
  Administered 2020-11-05: 0.25 mL via INTRAMUSCULAR
  Filled 2020-11-05: qty 0.25

## 2020-11-05 NOTE — Plan of Care (Signed)
  Problem: Activity: Goal: Risk for activity intolerance will decrease Outcome: Progressing   Problem: Coping: Goal: Level of anxiety will decrease Outcome: Progressing   Problem: Pain Managment: Goal: General experience of comfort will improve Outcome: Progressing   Problem: Safety: Goal: Ability to remain free from injury will improve Outcome: Progressing   Problem: Skin Integrity: Goal: Risk for impaired skin integrity will decrease Outcome: Progressing   Problem: Skin Integrity: Goal: Risk for impaired skin integrity will decrease Outcome: Progressing

## 2020-11-05 NOTE — Progress Notes (Signed)
Physical Therapy Treatment Patient Details Name: Teresa Richard MRN: [id number] DOB: [birth year] Today's Date: 11/05/2020    History of Present Illness Pt is 80 yo female who presented to ED with a mechanical fall-found to have right hip fracture. PMH includes advanced dementia, complete heart block-s/p PPM placement, hypothyroidism. Pt is now s/p Right hip hemiarthroplasty, anterior approach on 10/31/20.    PT Comments    Pt supine in bed on arrival.  Pt sleeping but able to rouse with VCs.  She actively participated in mobility from bed to recliner and able to advance steps this session.  Plan for snf placement remains appropriate.     Follow Up Recommendations  SNF;Supervision/Assistance - 24 hour     Equipment Recommendations  Other (comment) (TBD at next venue)    Recommendations for Other Services       Precautions / Restrictions Precautions Precautions: Fall Precaution Comments: No active abduction for 6 weeks Restrictions Weight Bearing Restrictions: Yes RLE Weight Bearing: Weight bearing as tolerated Other Position/Activity Restrictions: needs walker to stand/mobilize    Mobility  Bed Mobility Overal bed mobility: Needs Assistance Bed Mobility: Supine to Sit;Sit to Supine     Supine to sit: Mod assist     General bed mobility comments: Mod assistance to move LEs to edge of bed and elevate trunk into a seated position.    Transfers Overall transfer level: Needs assistance Equipment used: Rolling walker (2 wheeled) Transfers: Sit to/from Stand Sit to Stand: Mod assist;+2 safety/equipment         General transfer comment: Cues for hand placement to and from seated surface this session.  Pt required hand over hand placement.  Pt with posterior lean and bias.  Ambulation/Gait Ambulation/Gait assistance: Max assist;+2 safety/equipment Gait Distance (Feet): 4 Feet Assistive device: Rolling walker (2 wheeled) Gait Pattern/deviations: Step-to pattern;Leaning  posteriorly;Shuffle     General Gait Details: Shuffled steps from bed to recliner.   Stairs             Wheelchair Mobility    Modified Rankin (Stroke Patients Only)       Balance Overall balance assessment: Needs assistance Sitting-balance support: Feet supported Sitting balance-Leahy Scale: Poor       Standing balance-Leahy Scale: Poor Standing balance comment: external assistance and upper extremity support.                            Cognition Arousal/Alertness: Awake/alert Behavior During Therapy: Flat affect Overall Cognitive Status: History of cognitive impairments - at baseline                                 General Comments: Baseline dementia      Exercises      General Comments        Pertinent Vitals/Pain Pain Assessment: Faces Faces Pain Scale: Hurts even more Pain Location: R hip Pain Descriptors / Indicators: Crying;Discomfort;Grimacing;Guarding Pain Intervention(s): Monitored during session;Repositioned    Home Living                      Prior Function            PT Goals (current goals can now be found in the care plan section) Acute Rehab PT Goals Patient Stated Goal: none stated Progress towards PT goals: Progressing toward goals    Frequency    Min 3X/week  PT Plan      Co-evaluation              AM-PAC PT "6 Clicks" Mobility   Outcome Measure  Help needed turning from your back to your side while in a flat bed without using bedrails?: A Lot Help needed moving from lying on your back to sitting on the side of a flat bed without using bedrails?: A Lot Help needed moving to and from a bed to a chair (including a wheelchair)?: A Lot Help needed standing up from a chair using your arms (e.g., wheelchair or bedside chair)?: A Lot Help needed to walk in hospital room?: A Lot Help needed climbing 3-5 steps with a railing? : Total 6 Click Score: 11    End of Session  Equipment Utilized During Treatment: Gait belt Activity Tolerance: Patient limited by pain Patient left: in chair;with call bell/phone within reach;with chair alarm set Nurse Communication: Mobility status PT Visit Diagnosis: Other abnormalities of gait and mobility (R26.89);Repeated falls (R29.6);Pain Pain - Right/Left: Right Pain - part of body: Leg;Hip     Time: 1222-1237 PT Time Calculation (min) (ACUTE ONLY): 15 min  Charges:  $Therapeutic Activity: 8-22 mins                    Bonney Leitz , PTA Acute Rehabilitation Services Pager [phone] Office [phone]     Allianna Beaubien Artis Delay 11/05/2020, 12:47 PM

## 2020-11-05 NOTE — Plan of Care (Signed)
  Problem: Activity: Goal: Risk for activity intolerance will decrease Outcome: Progressing   Problem: Nutrition: Goal: Adequate nutrition will be maintained Outcome: Progressing   Problem: Elimination: Goal: Will not experience complications related to bowel motility Outcome: Completed/Met   

## 2020-11-05 NOTE — Progress Notes (Signed)
PROGRESS NOTE  Teresa Richard ERD:[id number] DOB: [birth year] DOA: 10/26/2020 PCP: Barbette Reichmann, MD   LOS: 10 days   Brief Narrative / Interim history: Patient is a 80 y.o. female with history of dementia, complete heart block-s/p PPM placement, hypothyroidism-presenting with a mechanical fall-found to have right hip fracture.  Significant events: 5/27>> admit for right hip fracture 6/1>> right hip hemiarthroplasty 6/3>> hemoglobin 5.2-2 units of PRBC transfused  Subjective / 24h Interval events: confused  Assessment & Plan: Principal Problem Right hip fracture: S/p right hip hemiarthroplasty-orthopedics recommending aspirin for VTE.  Orthopedics following-likely will need SNF -bed on 6/7  Perioperative blood loss anemia: Significant drop in hemoglobin overnight 6/3, status post unit of packed red blood cells.  Hemoglobin remained stable, no bleeding -improved to 9.1 so ? Accuracy of the 5.2  Mild hyperkalemia: Resolved-no longer on Lokelma.  History of complete heart block-s/p PPM placement  Hypothyroidism: Continue Synthroid  History of dementia: At risk for delirium-continue to maintain delirium precautions.  She remains on Depakote/Seroquel.    Depression/anxiety: Continue Lexapro/BuSpar-and as needed Xanax  Normocytic anemia: Stable for follow-up-on iron supplementation  Vitamin B12 deficiency: On supplementation  Scheduled Meds: . aspirin EC  325 mg Oral Q breakfast  . busPIRone  10 mg Oral BID  . busPIRone  7.5 mg Oral Q1500  . diclofenac Sodium  2 g Topical QID  . divalproex  125 mg Oral BID  . docusate  100 mg Oral BID  . escitalopram  10 mg Oral Daily  . ferrous sulfate  325 mg Oral Q breakfast  . levothyroxine  75 mcg Oral Q0600  . melatonin  10 mg Oral QHS  . multivitamin with minerals  1 tablet Oral Daily  . Pimavanserin Tartrate  34 mg Oral Daily  . QUEtiapine  100 mg Oral QHS  . QUEtiapine  75 mg Oral q AM  . senna  1 tablet Oral BID  .  vitamin B-12  500 mcg Oral Daily   Continuous Infusions: . methocarbamol (ROBAXIN) IV     PRN Meds:.acetaminophen, ALPRAZolam, fentaNYL (SUBLIMAZE) injection, menthol-cetylpyridinium **OR** phenol, methocarbamol **OR** methocarbamol (ROBAXIN) IV, metoCLOPramide **OR** metoCLOPramide (REGLAN) injection, ondansetron **OR** ondansetron (ZOFRAN) IV, polyethylene glycol  Diet Orders (From admission, onward)    Start     Ordered   11/05/20 0000  Diet general        11/05/20 0952   10/31/20 1657  Diet regular Room service appropriate? Yes; Fluid consistency: Thin  Diet effective now       Question Answer Comment  Room service appropriate? Yes   Fluid consistency: Thin      10/31/20 1656          DVT prophylaxis: SCDs Start: 10/31/20 1657     Code Status: DNR   Status is: Inpatient  Remains inpatient appropriate because:Inpatient level of care appropriate due to severity of illness   Dispo: The patient is from: Home              Anticipated d/c is to: SNF              Patient currently is medically stable to d/c.   Difficult to place patient No   Level of care: Telemetry Medical  Consultants:  Orthopedic surgery    Objective: Vitals:   11/04/20 1338 11/04/20 1612 11/04/20 2012 11/05/20 0805  BP:  139/72 (!) 120/95 (!) 110/49  Pulse:  89 75 62  Resp:  17 20 14   Temp:  98.2 F (36.8 C) 98.4  F (36.9 C) 97.6 F (36.4 C)  TempSrc:  Oral Oral Oral  SpO2:  100% 98% 99%  Weight: 47.5 kg     Height:        Intake/Output Summary (Last 24 hours) at 11/05/2020 1139 Last data filed at 11/05/2020 0600 Gross per 24 hour  Intake 180 ml  Output 1 ml  Net 179 ml   Filed Weights   10/26/20 2059 10/29/20 1200 11/04/20 1338  Weight: 59 kg 47.1 kg 47.5 kg    Examination:   General: Appearance:    elderly female in no acute distress     Lungs:     respirations unlabored  Heart:    Normal heart rate. Normal rhythm. No murmurs, rubs, or gallops.   MS:   All extremities  are intact.        Data Reviewed: I have independently reviewed following labs and imaging studies   CBC: Recent Labs  Lab 10/31/20 0818 11/01/20 0253 11/02/20 0124 11/02/20 1423 11/03/20 0149 11/04/20 0710  WBC 5.4 12.3* 6.6  --  6.1 6.1  HGB 10.4* 7.9* 5.2* 7.4* 7.9* 9.1*  HCT 33.6* 25.8* 16.6* 22.9* 24.0* 28.1*  MCV 97.1 97.0 94.9  --  93.8 94.6  PLT 301 271 155  --  162 211   Basic Metabolic Panel: Recent Labs  Lab 10/31/20 0818 11/01/20 0253 11/02/20 0124 11/03/20 0149 11/04/20 0710  NA 145 142 140 142 144  K 4.3 5.7* 4.0 3.6 4.0  CL 102 99 104 102 108  CO2 37* 34* 32 34* 32  GLUCOSE 99 170* 121* 107* 107*  BUN 35* 45* 61* 47* 29*  CREATININE 0.76 1.36* 1.24* 0.83 0.59  CALCIUM 9.6 9.3 8.5* 8.8* 9.0   Liver Function Tests: No results for input(s): AST, ALT, ALKPHOS, BILITOT, PROT, ALBUMIN in the last 168 hours. Coagulation Profile: No results for input(s): INR, PROTIME in the last 168 hours. HbA1C: No results for input(s): HGBA1C in the last 72 hours. CBG: No results for input(s): GLUCAP in the last 168 hours.  Recent Results (from the past 240 hour(s))  Resp Panel by RT-PCR (Flu A&B, Covid) Nasopharyngeal Swab     Status: None   Collection Time: 10/26/20  7:26 PM   Specimen: Nasopharyngeal Swab; Nasopharyngeal(NP) swabs in vial transport medium  Result Value Ref Range Status   SARS Coronavirus 2 by RT PCR NEGATIVE NEGATIVE Final    Comment: (NOTE) SARS-CoV-2 target nucleic acids are NOT DETECTED.  The SARS-CoV-2 RNA is generally detectable in upper respiratory specimens during the acute phase of infection. The lowest concentration of SARS-CoV-2 viral copies this assay can detect is 138 copies/mL. A negative result does not preclude SARS-Cov-2 infection and should not be used as the sole basis for treatment or other patient management decisions. A negative result may occur with  improper specimen collection/handling, submission of specimen  other than nasopharyngeal swab, presence of viral mutation(s) within the areas targeted by this assay, and inadequate number of viral copies(<138 copies/mL). A negative result must be combined with clinical observations, patient history, and epidemiological information. The expected result is Negative.  Fact Sheet for Patients:  BloggerCourse.com  Fact Sheet for Healthcare Providers:  SeriousBroker.it  This test is no t yet approved or cleared by the [place] FDA and  has been authorized for detection and/or diagnosis of SARS-CoV-2 by FDA under an Emergency Use Authorization (EUA). This EUA will remain  in effect (meaning this test can be used) for the duration of the  COVID-19 declaration under Section 564(b)(1) of the Act, 21 U.S.C.section 360bbb-3(b)(1), unless the authorization is terminated  or revoked sooner.       Influenza A by PCR NEGATIVE NEGATIVE Final   Influenza B by PCR NEGATIVE NEGATIVE Final    Comment: (NOTE) The Xpert Xpress SARS-CoV-2/FLU/RSV plus assay is intended as an aid in the diagnosis of influenza from Nasopharyngeal swab specimens and should not be used as a sole basis for treatment. Nasal washings and aspirates are unacceptable for Xpert Xpress SARS-CoV-2/FLU/RSV testing.  Fact Sheet for Patients: BloggerCourse.com  Fact Sheet for Healthcare Providers: SeriousBroker.it  This test is not yet approved or cleared by the [place] FDA and has been authorized for detection and/or diagnosis of SARS-CoV-2 by FDA under an Emergency Use Authorization (EUA). This EUA will remain in effect (meaning this test can be used) for the duration of the COVID-19 declaration under Section 564(b)(1) of the Act, 21 U.S.C. section 360bbb-3(b)(1), unless the authorization is terminated or revoked.  Performed at [facility], [address], [place],  [place] [address]   Surgical pcr screen     Status: None   Collection Time: 10/29/20  7:43 AM   Specimen: Nasal Mucosa; Nasal Swab  Result Value Ref Range Status   MRSA, PCR NEGATIVE NEGATIVE Final   Staphylococcus aureus NEGATIVE NEGATIVE Final    Comment: (NOTE) The Xpert SA Assay (FDA approved for NASAL specimens in patients 66 years of age and older), is one component of a comprehensive surveillance program. It is not intended to diagnose infection nor to guide or monitor treatment. Performed at [facility], [address], [place], [place] [address]   SARS CORONAVIRUS 2 (TAT 6-24 HRS) Nasopharyngeal Nasopharyngeal Swab     Status: None   Collection Time: 11/04/20 10:11 AM   Specimen: Nasopharyngeal Swab  Result Value Ref Range Status   SARS Coronavirus 2 NEGATIVE NEGATIVE Final    Comment: (NOTE) SARS-CoV-2 target nucleic acids are NOT DETECTED.  The SARS-CoV-2 RNA is generally detectable in upper and lower respiratory specimens during the acute phase of infection. Negative results do not preclude SARS-CoV-2 infection, do not rule out co-infections with other pathogens, and should not be used as the sole basis for treatment or other patient management decisions. Negative results must be combined with clinical observations, patient history, and epidemiological information. The expected result is Negative.  Fact Sheet for Patients: HairSlick.no  Fact Sheet for Healthcare Providers: quierodirigir.com  This test is not yet approved or cleared by the [place] FDA and  has been authorized for detection and/or diagnosis of SARS-CoV-2 by FDA under an Emergency Use Authorization (EUA). This EUA will remain  in effect (meaning this test can be used) for the duration of the COVID-19 declaration under Se ction 564(b)(1) of the Act, 21 U.S.C. section 360bbb-3(b)(1), unless the authorization is terminated or revoked  sooner.  Performed at [facility], [address], [place], [place] [address]      Radiology Studies: No results found.   Marlin Canary DO [facility]  Between 7 am - 7 pm I am available, please contact me via Amion (for emergencies) or Securechat (non urgent messages)  Between 7 pm - 7 am I am not available, please contact night coverage MD/APP via Amion

## 2020-11-05 NOTE — TOC Progression Note (Signed)
Transition of Care [facility]) - Progression Note    Patient Details  Name: Teresa Richard MRN: [id number] Date of Birth: March 22, 1941  Transition of [facility]) CM/SW Contact  Ralene Bathe, LCSWA Phone Number: 11/05/2020, 10:40 AM  Clinical Narrative:    CSW spoke with Verlon Au at [facility].  The facility is willing to accept the patient tomorrow.  They do not have the bed availability to take the patient today.    CSW notified the attending, RN, and patient's daughter Zella Ball of this information.    CSW confirmed with the daughter that the patient has received COVID vaccinations x2.  The patient's daughter was given the contact information to admissions at [facility] to schedule a day and time to complete the intake paperwork.  Pending bed availability.     Expected Discharge Plan: Skilled Nursing Facility Barriers to Discharge: Continued Medical Work up,Insurance Authorization,SNF Pending bed offer  Expected Discharge Plan and Services Expected Discharge Plan: Skilled Nursing Facility       Living arrangements for the past 2 months: Assisted Living Facility Expected Discharge Date: 11/05/20                                     Social Determinants of Health (SDOH) Interventions    Readmission Risk Interventions No flowsheet data found.

## 2020-11-06 NOTE — Progress Notes (Signed)
Nutrition Follow-up  DOCUMENTATION CODES:   Severe malnutrition in context of chronic illness  INTERVENTION:   -Continue Magic Cup TID with meals, each supplement provides 290 kcal and 9 grams of protein -Continue MVI with minerals daily  NUTRITION DIAGNOSIS:   Severe Malnutrition related to chronic illness (dementia) as evidenced by moderate fat depletion,severe fat depletion,moderate muscle depletion,severe muscle depletion.  Ongoing  GOAL:   Patient will meet greater than or equal to 90% of their needs  Progressing  MONITOR:   Labs,PO intake,Supplement acceptance,Weight trends  REASON FOR ASSESSMENT:   Consult Hip fracture protocol  ASSESSMENT:   80 year old female who presented to the ED on 5/27 from SNF. Pt had an unwitnessed fall on 5/15 and SNF completed an x-ray on pt showing R femur fracture. PMH of advanced dementia, depression, hypothyroidism, complete heart block s/p pacemaker.  6/1- s/p Right hip hemiarthroplasty, anterior approach  Reviewed I/O's: +240 ml x 24 hours and +4.1 L since admission  Pt's intake has improved since last visit. Noted meal completion 25-70%.   Per TOC notes, plan to d/c to SNF today.   Medications reviewed and include colace, ferrous sulfate, melatonin, senokot, and vitamin B-12.   Labs reviewed.   Diet Order:   Diet Order            Diet general           Diet regular Room service appropriate? Yes; Fluid consistency: Thin  Diet effective now                 EDUCATION NEEDS:   Education needs have been addressed  Skin:  Skin Assessment: Skin Integrity Issues: Skin Integrity Issues:: Wound VAC,Other (Comment) Wound Vac: lt hip Other: MASD rt and lt buttocks  Last BM:  11/05/20  Height:   Ht Readings from Last 1 Encounters:  10/29/20 5' (1.524 m)    Weight:   Wt Readings from Last 1 Encounters:  11/04/20 47.5 kg   BMI:  Body mass index is 20.45 kg/m.  Estimated Nutritional Needs:   Kcal:   1450-1650  Protein:  65-80 grams  Fluid:  1.4-1.6 L    Levada Schilling, RD, LDN, CDCES Registered Dietitian II Certified Diabetes Care and Education Specialist Please refer to [facility] for RD and/or RD on-call/weekend/after hours pager

## 2020-11-06 NOTE — Discharge Summary (Signed)
Physician Discharge Summary  Teresa Richard FHL:[id number] DOB: [birth year] DOA: 10/26/2020  PCP: Barbette Reichmann, MD  Admit date: 10/26/2020 Discharge date: 11/06/2020  Admitted From: memory care Discharge disposition: SNF   Recommendations for Outpatient Follow-Up:   1. Palliative care to follow at SNF 2. Continue wound vac per ortho: Change house vac to prevena portable vac at discharge.  Follow up with Dr.Swinteck's clinic 1 week post discharge for dressing removal 3. Cbc 1 week   Discharge Diagnosis:   Principal Problem:   Closed right hip fracture, initial encounter (HCC) Active Problems:   H/O cardiac pacemaker   Thyroid disease   Pancytopenia (HCC)   B12 deficiency anemia   Hip fracture (HCC)   Protein-calorie malnutrition, severe    Discharge Condition: Improved.  Diet recommendation: Regular.  Wound care: wound vac  Code status: DNR   History of Present Illness:  Teresa Richard is a 80 y.o. female with history of advanced dementia and a dementia unit with history of depression, hypothyroidism, complete heart block status post pacemaker placement had a fall Oct 10, 2020 while patient was in the nursing unit.  Was a witnessed fall.  Was taken to [facility] at that time CT head and work-up was negative was sent back to the facility.  Patient had another fall in the bathroom on Oct 14, 2020 was taken again to the ER CT head was unremarkable was sent back to the facility.  Over the last 1 week patient was finding it increasingly difficult to walk because of right hip pain and had to use wheelchair.  Given the worsening pain patient was brought to the ER.   Hospital Course by Problem:   Right hip fracture:S/p right hip hemiarthroplasty-orthopedics recommending aspirin for VTE. Orthopedics following-- will need SNF Per ortho: Change house vac to prevena portable vac at discharge.  Follow up with Dr.Swinteck's clinic 1 week post discharge  for dressing removal  Perioperative blood loss anemia:Significant drop in hemoglobin overnight 6/3, status post unit of packed red blood cells.  Hemoglobin remained stable, no bleeding -improved to 9.1 so ? Accuracy of the 5.2  Mild hyperkalemia:Resolved-no longer on Lokelma.  History of complete heart block-s/p PPM placement  Hypothyroidism: Continue Synthroid  History of dementia: At risk for delirium-continue to maintain delirium precautions. She remains on Depakote/Seroquel.   Depression/anxiety:Continue Lexapro/BuSpar-and as needed Xanax  Normocytic anemia:Stable for follow-up-on iron supplementation  Vitamin B12 deficiency: On supplementation    Medical Consultants:   ortho   Discharge Exam:   Vitals:   11/05/20 2200 11/06/20 0455  BP: (!) 128/51 117/63  Pulse: 69 (!) 59  Resp: 17 17  Temp: 98.2 F (36.8 C) 97.8 F (36.6 C)  SpO2: 99% 100%   Vitals:   11/05/20 0805 11/05/20 1501 11/05/20 2200 11/06/20 0455  BP: (!) 110/49 (!) 119/48 (!) 128/51 117/63  Pulse: 62 73 69 (!) 59  Resp: 14 16 17 17   Temp: 97.6 F (36.4 C) 98 F (36.7 C) 98.2 F (36.8 C) 97.8 F (36.6 C)  TempSrc: Oral Oral Oral Oral  SpO2: 99% 100% 99% 100%  Weight:      Height:        General exam: Appears calm and comfortable.   The results of significant diagnostics from this hospitalization (including imaging, microbiology, ancillary and laboratory) are listed below for reference.     Procedures and Diagnostic Studies:   DG Knee 1-2 Views Right  Result Date: 10/26/2020 CLINICAL DATA:  Unwitnessed fall. EXAM: RIGHT KNEE - 1-2 VIEW COMPARISON:  None. FINDINGS: Bones diffusely under mineralized. Prior lateral plate and multi screw fixation of the proximal tibia. No evidence of acute fracture or periprosthetic lucency. Moderate tricompartmental osteoarthritis. No significant knee joint effusion. IMPRESSION: 1. No acute fracture or dislocation of the right knee. 2. Prior  proximal tibial ORIF without complication. 3. Moderate tricompartmental osteoarthritis. Electronically Signed   By: Narda RutherfordMelanie  Sanford M.D.   On: 10/26/2020 20:34   DG Hip Unilat W or Wo Pelvis 2-3 Views Right  Result Date: 10/26/2020 CLINICAL DATA:  Post fall with recent hip fracture on outpatient imaging. EXAM: DG HIP (WITH OR WITHOUT PELVIS) 2-3V RIGHT COMPARISON:  Hip radiograph 10/10/2020 FINDINGS: Right femoral neck fracture which is displaced. There is proximal migration of the femoral shaft. The femoral head is well seated in the acetabulum. Pubic rami are intact. Bones are diffusely under mineralized. Pubic symphysis and sacroiliac joints are congruent. IMPRESSION: Displaced right femoral neck fracture. Electronically Signed   By: Narda RutherfordMelanie  Sanford M.D.   On: 10/26/2020 20:31     Labs:   Basic Metabolic Panel: Recent Labs  Lab 10/31/20 0818 11/01/20 0253 11/02/20 0124 11/03/20 0149 11/04/20 0710  NA 145 142 140 142 144  K 4.3 5.7* 4.0 3.6 4.0  CL 102 99 104 102 108  CO2 37* 34* 32 34* 32  GLUCOSE 99 170* 121* 107* 107*  BUN 35* 45* 61* 47* 29*  CREATININE 0.76 1.36* 1.24* 0.83 0.59  CALCIUM 9.6 9.3 8.5* 8.8* 9.0   GFR Estimated Creatinine Clearance: 40.3 mL/min (by C-G formula based on SCr of 0.59 mg/dL). Liver Function Tests: No results for input(s): AST, ALT, ALKPHOS, BILITOT, PROT, ALBUMIN in the last 168 hours. No results for input(s): LIPASE, AMYLASE in the last 168 hours. No results for input(s): AMMONIA in the last 168 hours. Coagulation profile No results for input(s): INR, PROTIME in the last 168 hours.  CBC: Recent Labs  Lab 10/31/20 0818 11/01/20 0253 11/02/20 0124 11/02/20 1423 11/03/20 0149 11/04/20 0710  WBC 5.4 12.3* 6.6  --  6.1 6.1  HGB 10.4* 7.9* 5.2* 7.4* 7.9* 9.1*  HCT 33.6* 25.8* 16.6* 22.9* 24.0* 28.1*  MCV 97.1 97.0 94.9  --  93.8 94.6  PLT 301 271 155  --  162 211   Cardiac Enzymes: No results for input(s): CKTOTAL, CKMB, CKMBINDEX,  TROPONINI in the last 168 hours. BNP: Invalid input(s): POCBNP CBG: No results for input(s): GLUCAP in the last 168 hours. D-Dimer No results for input(s): DDIMER in the last 72 hours. Hgb A1c No results for input(s): HGBA1C in the last 72 hours. Lipid Profile No results for input(s): CHOL, HDL, LDLCALC, TRIG, CHOLHDL, LDLDIRECT in the last 72 hours. Thyroid function studies No results for input(s): TSH, T4TOTAL, T3FREE, THYROIDAB in the last 72 hours.  Invalid input(s): FREET3 Anemia work up No results for input(s): VITAMINB12, FOLATE, FERRITIN, TIBC, IRON, RETICCTPCT in the last 72 hours. Microbiology Recent Results (from the past 240 hour(s))  Surgical pcr screen     Status: None   Collection Time: 10/29/20  7:43 AM   Specimen: Nasal Mucosa; Nasal Swab  Result Value Ref Range Status   MRSA, PCR NEGATIVE NEGATIVE Final   Staphylococcus aureus NEGATIVE NEGATIVE Final    Comment: (NOTE) The Xpert SA Assay (FDA approved for NASAL specimens in patients 81 years of age and older), is one component of a comprehensive surveillance program. It is not intended to diagnose infection nor to guide  or monitor treatment. Performed at [facility], [address], [place], [place] [address]   SARS CORONAVIRUS 2 (TAT 6-24 HRS) Nasopharyngeal Nasopharyngeal Swab     Status: None   Collection Time: 11/04/20 10:11 AM   Specimen: Nasopharyngeal Swab  Result Value Ref Range Status   SARS Coronavirus 2 NEGATIVE NEGATIVE Final    Comment: (NOTE) SARS-CoV-2 target nucleic acids are NOT DETECTED.  The SARS-CoV-2 RNA is generally detectable in upper and lower respiratory specimens during the acute phase of infection. Negative results do not preclude SARS-CoV-2 infection, do not rule out co-infections with other pathogens, and should not be used as the sole basis for treatment or other patient management decisions. Negative results must be combined with clinical observations, patient  history, and epidemiological information. The expected result is Negative.  Fact Sheet for Patients: HairSlick.no  Fact Sheet for Healthcare Providers: quierodirigir.com  This test is not yet approved or cleared by the [place] FDA and  has been authorized for detection and/or diagnosis of SARS-CoV-2 by FDA under an Emergency Use Authorization (EUA). This EUA will remain  in effect (meaning this test can be used) for the duration of the COVID-19 declaration under Se ction 564(b)(1) of the Act, 21 U.S.C. section 360bbb-3(b)(1), unless the authorization is terminated or revoked sooner.  Performed at [facility], [address], [place], [place] [address]      Discharge Instructions:   Discharge Instructions    Diet general   Complete by: As directed    Discharge wound care:   Complete by: As directed    prevena portable vac at discharge   Increase activity slowly   Complete by: As directed      Allergies as of 11/06/2020      Reactions   Hydrocodone Other (See Comments)   Feels like she is floating   Oxycodone Other (See Comments)   Feels like she is floating      Medication List    STOP taking these medications   magnesium hydroxide 400 MG/5ML suspension Commonly known as: MILK OF MAGNESIA     TAKE these medications   acetaminophen 500 MG tablet Commonly known as: TYLENOL Take 500 mg by mouth every 6 (six) hours as needed for mild pain or fever. What changed: Another medication with the same name was removed. Continue taking this medication, and follow the directions you see here.   ALPRAZolam 0.5 MG tablet Commonly known as: XANAX Take 1 tablet (0.5 mg total) by mouth 2 (two) times daily as needed for anxiety. What changed: Another medication with the same name was removed. Continue taking this medication, and follow the directions you see here.   aspirin 81 MG chewable tablet Commonly known  as: Aspirin Childrens Chew 1 tablet (81 mg total) by mouth 2 (two) times daily with a meal. What changed: when to take this   busPIRone 5 MG tablet Commonly known as: BUSPAR Take 7.5-10 mg by mouth See admin instructions. 10 mg in the morning and at bedtime. 7.5 mg in the afternoon   diclofenac Sodium 1 % Gel Commonly known as: VOLTAREN Apply 2 g topically in the morning and at bedtime. Right knee   divalproex 125 MG capsule Commonly known as: DEPAKOTE SPRINKLE Take 125 mg by mouth 2 (two) times daily.   escitalopram 10 MG tablet Commonly known as: LEXAPRO Take 10 mg by mouth daily.   ferrous sulfate 325 (65 FE) MG tablet Take 325 mg by mouth daily  with breakfast.   levothyroxine 75 MCG tablet Commonly known as: SYNTHROID Take 75 mcg by mouth every morning.   loperamide 2 MG tablet Commonly known as: IMODIUM A-D Take 2-4 mg by mouth See admin instructions. 4 mg after first loose stool, then 2 mg after each loose stool. Max 4 tabs in 24 hours   magnesium oxide 400 MG tablet Commonly known as: MAG-OX Take 400 mg by mouth daily.   Medical Compression Stockings Misc 1 application See admin instructions. Apply in the morning and remove at bedtime   Melatonin 10 MG Tabs Take 10 mg by mouth at bedtime.   Nuplazid 34 MG Caps Generic drug: Pimavanserin Tartrate Take 34 mg by mouth daily.   QUEtiapine 50 MG tablet Commonly known as: SEROQUEL Take 75 mg by mouth in the morning.   QUEtiapine 100 MG tablet Commonly known as: SEROQUEL Take 100 mg by mouth at bedtime.   traMADol 50 MG tablet Commonly known as: Ultram Take 1 tablet (50 mg total) by mouth every 6 (six) hours as needed for up to 28 days.   vitamin B-12 500 MCG tablet Commonly known as: CYANOCOBALAMIN Take 500 mcg by mouth daily.   Vitamin D3 50 MCG (2000 UT) Tabs Take 2,000 Units by mouth daily.            Discharge Care Instructions  (From admission, onward)         Start     Ordered    11/05/20 0000  Discharge wound care:       Comments: prevena portable vac at discharge   11/05/20 0630            Time coordinating discharge: 35 min  Signed:  Joseph Art DO  [facility] 11/06/2020, 8:33 AM

## 2020-11-06 NOTE — Plan of Care (Signed)

## 2020-11-06 NOTE — Progress Notes (Signed)
Patient discharge teaching given to daughter, including activity, diet, follow-up appoints, and medications. Daughter verbalized understanding of all discharge instructions. IV access was d/c'd. Wound vac d/c'd. Vitals are stable. Skin is intact except as charted in most recent assessments. Home medication returned to daughter at time of discharge. Report given to nurse at [facility]. Pt to be escorted out by [facility] upon arrival to SNF.

## 2020-11-06 NOTE — Progress Notes (Signed)
OT Cancellation Note  Patient Details Name: Gittel Mccamish MRN: [id number] DOB: Oct 15, 1940   Cancelled Treatment:    Reason Eval/Treat Not Completed: Patient declined, no reason specified (Pt asleep upon arrival, daughter in the room stating pt just settled down and has been agitated this morning. Pt with d/c plans to go to SNF today.)  Pharrah Rottman A Emberlynn Riggan 11/06/2020, 11:37 AM

## 2020-11-06 NOTE — Care Management Important Message (Signed)
Important Message  Patient Details  Name: Teresa Richard MRN: [id number] Date of Birth: 29-Sep-1940   Medicare Important Message Given:  Yes     Vaness Jelinski P Elisandro Jarrett 11/06/2020, 1:51 PM

## 2020-11-06 NOTE — TOC Transition Note (Signed)
Transition of Care [facility]) - CM/SW Discharge Note   Patient Details  Name: Teresa Richard MRN: [id number] Date of Birth: May 09, 1941  [facility] CM/SW Contact:  Ralene Bathe, LCSWA Phone Number: 11/06/2020, 10:26 AM   Clinical Narrative:    Patient will DC to: SNF Anticipated DC date: 11/06/2020 Family notified: Yes Transport by: Sharin Mons   Per MD patient ready for DC to SNF . RN to call report prior to discharge [phone]. RN, patient, patient's family, and facility notified of DC. Discharge Summary and FL2 sent to facility. DC packet on chart. Ambulance transport requested for patient.   CSW will sign off for now as social work intervention is no longer needed. Please consult [place] again if new needs arise.     Final next level of care: Skilled Nursing Facility Barriers to Discharge: Barriers Resolved   Patient Goals and CMS Choice   CMS Medicare.gov Compare Post Acute Care list provided to:: Patient Represenative (must comment) Choice offered to / list presented to : Adult Children  Discharge Placement              Patient [facility] bed at: [facility] Patient to be transferred to facility by: [facility] Name of family member notified: Merdis Delay (Daughter)   [phone] Patient and family notified of of transfer: 11/06/20  Discharge Plan and Services                                     Social Determinants of Health (SDOH) Interventions     Readmission Risk Interventions No flowsheet data found.

## 2020-11-26 ENCOUNTER — Emergency Department
Admission: EM | Admit: 2020-11-26 | Discharge: 2020-11-27 | Disposition: A | Discharge status: Home / Self Care | Payer: Medicare Other | Attending: Emergency Medicine | Admitting: Emergency Medicine

## 2020-11-26 ENCOUNTER — Emergency Department: Payer: Medicare Other

## 2020-11-26 DIAGNOSIS — Z7982 Long term (current) use of aspirin: Secondary | ICD-10-CM | POA: Insufficient documentation

## 2020-11-26 DIAGNOSIS — Z8616 Personal history of COVID-19: Secondary | ICD-10-CM | POA: Insufficient documentation

## 2020-11-26 DIAGNOSIS — Y92129 Unspecified place in nursing home as the place of occurrence of the external cause: Secondary | ICD-10-CM | POA: Diagnosis not present

## 2020-11-26 DIAGNOSIS — W19XXXA Unspecified fall, initial encounter: Secondary | ICD-10-CM | POA: Diagnosis not present

## 2020-11-26 DIAGNOSIS — Z96641 Presence of right artificial hip joint: Secondary | ICD-10-CM | POA: Diagnosis not present

## 2020-11-26 DIAGNOSIS — S0990XA Unspecified injury of head, initial encounter: Secondary | ICD-10-CM | POA: Diagnosis present

## 2020-11-26 DIAGNOSIS — E039 Hypothyroidism, unspecified: Secondary | ICD-10-CM | POA: Diagnosis not present

## 2020-11-26 DIAGNOSIS — Z79899 Other long term (current) drug therapy: Secondary | ICD-10-CM | POA: Diagnosis not present

## 2020-11-26 DIAGNOSIS — Z950 Presence of cardiac pacemaker: Secondary | ICD-10-CM | POA: Insufficient documentation

## 2020-11-26 DIAGNOSIS — S0101XA Laceration without foreign body of scalp, initial encounter: Secondary | ICD-10-CM | POA: Diagnosis not present

## 2020-11-26 DIAGNOSIS — S0181XA Laceration without foreign body of other part of head, initial encounter: Secondary | ICD-10-CM

## 2020-11-26 DIAGNOSIS — F0390 Unspecified dementia without behavioral disturbance: Secondary | ICD-10-CM | POA: Diagnosis not present

## 2020-11-26 MED ORDER — BACITRACIN ZINC 500 UNIT/GM EX OINT
TOPICAL_OINTMENT | Freq: Once | CUTANEOUS | Status: DC
  Filled 2020-11-26: qty 0.9

## 2020-11-26 MED ORDER — ACETAMINOPHEN 500 MG PO TABS
1000.0000 mg | ORAL_TABLET | Freq: Once | ORAL | Status: AC
  Administered 2020-11-26: 1000 mg via ORAL
  Filled 2020-11-26: qty 2

## 2020-11-26 NOTE — ED Provider Notes (Signed)
[facility] Emergency Department Provider Note ____________________________________________   Event Date/Time   First MD Initiated Contact with Patient 11/26/20 2140     (approximate)  I have reviewed the triage vital signs and the nursing notes.  HISTORY  Chief Complaint Fall and Head Laceration   HPI Teresa PavlovLinda Richard is a 80 y.o. femalewho presents to the ED for evaluation of a fall.   Chart review indicates hx advanced dementia who resides at a local dementia unit.  CHB s/p pacemaker placement.  DNR status.  Recent right hip hemiarthroplasty about a month ago.  No thinners.  Patient presents to the ED from her local memory care unit SNF for evaluation of a fall.  Daughter is at the bedside and provides all history as patient has quite severe dementia and cannot provide any history due to this.  Daughter reports getting a call from the facility today after patient had a witnessed mechanical fall.  Apparently since her recent hip hemiarthroplasty she has been inconsistent with PT directions and just try to get up and walk around on her own.  Daughter reports that patient got up with staff and trying to walk without assist device on her own and fell to the ground, smacking her forehead on the ground.  No known syncope or seizure-like activity.    Daughter reports that patient is acting normally enough right now.  No other concerns.   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    Patient Active Problem List   Diagnosis Date Noted   Protein-calorie malnutrition, severe 10/30/2020   Closed right hip fracture, initial encounter (HCC) 10/26/2020   Hip fracture (HCC) 10/26/2020   COVID-19 virus infection 02/16/2019   B12 deficiency anemia 02/16/2019   Syncope  02/09/2019   Pancytopenia (HCC) 02/09/2019   H/O cardiac pacemaker 08/30/2015   Thyroid disease 01/23/2014   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    Past Surgical History:  Procedure Laterality Date   ANTERIOR APPROACH HEMI HIP ARTHROPLASTY Right 10/31/2020   Procedure: HEMIARTHROPLASTY;  Surgeon: Samson FredericSwinteck, Brian, MD;  Location: [facility] OR;  Service: Orthopedics;  Laterality: Right;   BREAST CYST EXCISION Left 1980's   negative   CATARACT EXTRACTION W/PHACO Left 11/11/2016   Procedure: CATARACT EXTRACTION PHACO AND INTRAOCULAR LENS PLACEMENT (IOC);  Surgeon: Galen ManilaPorfilio, William, MD;  Location: [facility] ORS;  Service: Ophthalmology;  Laterality: Left;  US  00:55 AP% 14.7 CDE 8.15 Fluid pack lot # [id number]   CTR Right    EYE SURGERY Bilateral    cataract extractions   FRACTURE SURGERY Right 2005   LEG. nuts and bolts in knee and foot   IMPLANTABLE CARDIOVERTER DEFIBRILLATOR (ICD) GENERATOR CHANGE Left 07/07/2018   Procedure: PACER CHANGEOUT;  Surgeon: Marcina MillardParaschos, Alexander, MD;  Location: [facility] ORS;  Service: Cardiovascular;  Laterality: Left;   INSERT / REPLACE / REMOVE PACEMAKER     PACEMAKER INSERTION     RCR Left 2008    Prior to Admission medications   Medication Sig Start Date End Date Taking? Authorizing Provider  acetaminophen (TYLENOL) 500 MG tablet Take 500 mg by mouth every 6 (six) hours as needed for mild pain or fever.    [provider]  ALPRAZolam (XANAX) 0.5 MG tablet Take 1 tablet (0.5 mg total) by mouth 2 (two) times daily as needed for anxiety. 11/05/20   Joseph Art, DO  aspirin (ASPIRIN CHILDRENS) 81 MG chewable tablet Chew 1 tablet (81 mg total) by mouth 2 (two) times daily with a meal. 11/01/20 12/16/20  Barrie Dunker B, PA  busPIRone (BUSPAR) 5 MG tablet Take 7.5-10 mg by mouth See admin instructions. 10 mg in the morning and at bedtime. 7.5 mg in the afternoon    [provider]  Cholecalciferol (VITAMIN D3) 50 MCG (2000 UT) TABS Take 2,000 Units by  mouth daily.    [provider]  diclofenac Sodium (VOLTAREN) 1 % GEL Apply 2 g topically in the morning and at bedtime. Right knee    [provider]  divalproex (DEPAKOTE SPRINKLE) 125 MG capsule Take 125 mg by mouth 2 (two) times daily.    [provider]  Elastic Bandages & Supports (MEDICAL COMPRESSION STOCKINGS) MISC 1 application See admin instructions. Apply in the morning and remove at bedtime    [provider]  escitalopram (LEXAPRO) 10 MG tablet Take 10 mg by mouth daily.    [provider]  ferrous sulfate 325 (65 FE) MG tablet Take 325 mg by mouth daily with breakfast.    [provider]  levothyroxine (SYNTHROID, LEVOTHROID) 75 MCG tablet Take 75 mcg by mouth every morning.     [provider]  loperamide (IMODIUM A-D) 2 MG tablet Take 2-4 mg by mouth See admin instructions. 4 mg after first loose stool, then 2 mg after each loose stool. Max 4 tabs in 24 hours    [provider]  magnesium oxide (MAG-OX) 400 MG tablet Take 400 mg by mouth daily.    [provider]  Melatonin 10 MG TABS Take 10 mg by mouth at bedtime.    [provider]  Pimavanserin Tartrate (NUPLAZID) 34 MG CAPS Take 34 mg by mouth daily.    [provider]  QUEtiapine (SEROQUEL) 100 MG tablet Take 100 mg by mouth at bedtime.    [provider]  QUEtiapine (SEROQUEL) 50 MG tablet Take 75 mg by mouth in the morning.    [provider]  traMADol (ULTRAM) 50 MG tablet Take 1 tablet (50 mg total) by mouth every 6 (six) hours as needed for up to 28 days. 11/01/20 11/29/20  Darrick Grinder, PA  vitamin B-12 (CYANOCOBALAMIN) 500 MCG tablet Take 500 mcg by mouth daily.    [provider]    Allergies Hydrocodone and Oxycodone  Family History  Problem Relation Age of Onset   Breast cancer Neg Hx     Social History Social History   Tobacco Use   Smoking status: Never   Smokeless tobacco:  Never  Vaping Use   Vaping Use: Never used  Substance Use Topics   Alcohol use: No   Drug use: No    Review of Systems  Unable to be accurately assessed due to patient's severe dementia ______________________________________   PHYSICAL EXAM:  VITAL SIGNS: Vitals:   11/26/20 2151 11/26/20 2213  BP: (!) 106/46 (!) 108/54  Pulse: 69 63  Resp: 16 16  Temp: 98.7 F (37.1 C)   SpO2: 100% 100%     Constitutional: Alert and oriented. Well appearing and in no acute distress. Eyes: Conjunctivae are normal. PERRL. EOMI. Head: 1.5 cm horizontal laceration to the mid forehead that is hemostatic with direct pressure.  Small superiorly extending  superficial abrasion and avulsion of epidermis. Further has right-sided periorbital bruising without evidence of EOM entrapment.  No bony step-offs to the midface or forehead,.  No evidence of nasal septal hematoma. No battle sign. Nose: No congestion/rhinnorhea. Mouth/Throat: Mucous membranes are moist.  Oropharynx non-erythematous. Neck: No stridor. No cervical spine tenderness to palpation. Cardiovascular: Normal rate, regular rhythm. Grossly normal heart sounds.  Good peripheral circulation. Respiratory: Normal respiratory effort.  No retractions. Lungs CTAB. Gastrointestinal: Soft , nondistended, nontender to palpation. No CVA tenderness. Musculoskeletal: Palpation of all 4 extremities without deformity, evidence of laceration or significant traumatic injury. Neurologic:   No gross focal neurologic deficits are appreciated.  Follows simple commands. Skin:  Skin is warm, dry and intact. No rash noted. Psychiatric: Mood and affect are difficult to assess  ____________________________________________   LABS (all labs ordered are listed, but only abnormal results are displayed)  Labs Reviewed - No data to display ____________________________________________  12 Lead EKG   ____________________________________________  RADIOLOGY  ED  MD interpretation: Plain film of the pelvis and right hip reviewed by me without evidence of periprosthetic fracture or dislocation  Official radiology report(s): DG Hip Unilat W or Wo Pelvis 2-3 Views Right  Result Date: 11/26/2020 CLINICAL DATA:  Recent hemiarthroplasty, fall EXAM: DG HIP (WITH OR WITHOUT PELVIS) 2-3V RIGHT COMPARISON:  Radiograph 10/31/2020 FINDINGS: The osseous structures appear diffusely demineralized which may limit detection of small or nondisplaced fractures. Postsurgical changes from prior right hip hemiarthroplasty. No new cortical step-off or convincing site of periprosthetic fracture is radiographically apparent. Remaining bones of the pelvis and proximal left femur appear intact and congruent. Degenerative changes noted in the lower lumbar spine, bilateral SI joints, bilateral hips and symphysis pubis. Right lateral hip swelling. No other acute traumatic soft tissue abnormality. Large colonic stool burden including more inspissated feculent material towards the colon and rectal vault. IMPRESSION: Postsurgical changes from prior right hip hemiarthroplasty. No clear periprosthetic fracture or acute complication is evident. No other acute or worrisome osseous abnormalities. Mild right lateral hip swelling, could reflect contusive change or residual swelling from the operative site. Large colonic stool burden, correlate for features of slow transit/constipation or impaction. Electronically Signed   By: Kreg Shropshire M.D.   On: 11/26/2020 22:56    ____________________________________________   PROCEDURES and INTERVENTIONS  Procedure(s) performed (including Critical Care):  Marland KitchenMarland KitchenLaceration Repair  Date/Time: 11/26/2020 11:24 PM Performed by: Delton Prairie, MD Authorized by: Delton Prairie, MD   Consent:    Consent obtained:  Verbal   Consent given by:  Healthcare agent (daughter at bedside)   Risks discussed:  Infection, pain and retained foreign body   Alternatives discussed:   No treatment Anesthesia:    Anesthesia method:  None Laceration details:    Location:  Scalp   Scalp location:  Frontal   Length (cm):  3 Exploration:    Hemostasis achieved with:  Direct pressure   Imaging obtained comment:  CT Treatment:    Area cleansed with:  Saline   Amount of cleaning:  Standard   Irrigation solution:  Sterile saline   Irrigation method:  Pressure wash   Visualized foreign bodies/material removed: no     Debridement:  None   Undermining:  None Skin repair:    Repair method:  Staples   Number of staples:  3 Approximation:    Approximation:  Close Repair type:    Repair type:  Simple Post-procedure details:    Dressing:  Antibiotic ointment   Procedure completion:  Tolerated  well, no immediate complications  Medications  bacitracin ointment (has no administration in time range)  acetaminophen (TYLENOL) tablet 1,000 mg (1,000 mg Oral Given 11/26/20 2233)    ____________________________________________   MDM / ED COURSE   Pleasantly demented 80 year old woman presents to the ED after mechanical fall from her SNF requiring imaging.  She has frontal and facial trauma with a forehead laceration requiring staple repair, as above.  Otherwise has a small amount of flat bruising to her right periorbital face without evidence of bony step-offs to suggest maxillary fracture.  No evidence of EOM entrapment or neurologic deficits.  No signs of significant trauma or deformity to her extremities necessitating additional imaging.  Due to her recent right-sided hemiarthroplasty, plain film of the pelvis and hip obtained without evidence of hardware malfunction or superimposed fracture.  Patient signed out to oncoming divider to follow-up on CT imaging of head, neck and face.  As long as there are no significant pathologies there anticipate return to facility and outpatient management.     ____________________________________________   FINAL CLINICAL IMPRESSION(S) /  ED DIAGNOSES  Final diagnoses:  Fall, initial encounter  Injury of head, initial encounter  Forehead laceration, initial encounter     ED Discharge Orders     None        Alfonsa Vaile   Note:  This document was prepared using Dragon voice recognition software and may include unintentional dictation errors.    Delton Prairie, MD 11/26/20 [phone]

## 2020-11-26 NOTE — ED Provider Notes (Signed)
  Physical Exam  BP (!) 108/54   Pulse 63   Temp 98.7 F (37.1 C) (Oral)   Resp 16   Ht 5\' 5"  (1.651 m)   Wt 47.6 kg   SpO2 100%   BMI 17.47 kg/m   Physical Exam  ED Course/Procedures     Procedures  MDM  11:39 PM Assumed care of patient at signout after a fall at nursing facility.  CT is pending.  CT head, face and cervical spine show a mild right frontal soft tissue swelling but no other acute injury.  Will discharge back to her nursing home per previous provider plan.  Updated daughter who is comfortable with plan.       Masiel Gentzler, , DO 11/27/20 [phone]

## 2020-11-27 NOTE — ED Notes (Signed)
Report called to Marily Memos at [facility].  RN verbalized understanding of report.  Patient is stable for discharge and transport back to her facilty

## 2020-11-29 ENCOUNTER — Emergency Department
Admission: EM | Admit: 2020-11-29 | Discharge: 2020-11-29 | Disposition: A | Discharge status: Skilled Nursing Facility | Payer: Medicare Other | Attending: Emergency Medicine | Admitting: Emergency Medicine

## 2020-11-29 ENCOUNTER — Other Ambulatory Visit: Payer: Self-pay

## 2020-11-29 ENCOUNTER — Encounter: Payer: Self-pay | Admitting: Emergency Medicine

## 2020-11-29 DIAGNOSIS — Z7982 Long term (current) use of aspirin: Secondary | ICD-10-CM | POA: Diagnosis not present

## 2020-11-29 DIAGNOSIS — Z950 Presence of cardiac pacemaker: Secondary | ICD-10-CM | POA: Diagnosis not present

## 2020-11-29 DIAGNOSIS — Z8616 Personal history of COVID-19: Secondary | ICD-10-CM | POA: Diagnosis not present

## 2020-11-29 DIAGNOSIS — S0990XA Unspecified injury of head, initial encounter: Secondary | ICD-10-CM | POA: Diagnosis present

## 2020-11-29 DIAGNOSIS — Z79899 Other long term (current) drug therapy: Secondary | ICD-10-CM | POA: Diagnosis not present

## 2020-11-29 DIAGNOSIS — S0101XA Laceration without foreign body of scalp, initial encounter: Secondary | ICD-10-CM | POA: Diagnosis not present

## 2020-11-29 DIAGNOSIS — F0390 Unspecified dementia without behavioral disturbance: Secondary | ICD-10-CM | POA: Diagnosis not present

## 2020-11-29 DIAGNOSIS — W19XXXA Unspecified fall, initial encounter: Secondary | ICD-10-CM | POA: Insufficient documentation

## 2020-11-29 DIAGNOSIS — E039 Hypothyroidism, unspecified: Secondary | ICD-10-CM | POA: Diagnosis not present

## 2020-11-29 DIAGNOSIS — F03C0 Unspecified dementia, severe, without behavioral disturbance, psychotic disturbance, mood disturbance, and anxiety: Secondary | ICD-10-CM

## 2020-11-29 MED ORDER — DIVALPROEX SODIUM 125 MG PO CSDR
125.0000 mg | DELAYED_RELEASE_CAPSULE | ORAL | Status: AC
  Administered 2020-11-29: 125 mg via ORAL
  Filled 2020-11-29: qty 1

## 2020-11-29 MED ORDER — HALOPERIDOL LACTATE 5 MG/ML IJ SOLN
2.5000 mg | Freq: Once | INTRAMUSCULAR | Status: AC
  Administered 2020-11-29: 2.5 mg via INTRAMUSCULAR
  Filled 2020-11-29: qty 1

## 2020-11-29 MED ORDER — LIDOCAINE-EPINEPHRINE 2 %-1:100000 IJ SOLN
20.0000 mL | Freq: Once | INTRAMUSCULAR | Status: AC
  Administered 2020-11-29: 20 mL

## 2020-11-29 MED ORDER — BUSPIRONE HCL 5 MG PO TABS
10.0000 mg | ORAL_TABLET | ORAL | Status: AC
  Administered 2020-11-29: 10 mg via ORAL
  Filled 2020-11-29: qty 2

## 2020-11-29 MED ORDER — QUETIAPINE FUMARATE 25 MG PO TABS
100.0000 mg | ORAL_TABLET | ORAL | Status: AC
  Administered 2020-11-29: 100 mg via ORAL
  Filled 2020-11-29: qty 4

## 2020-11-29 MED ORDER — MELATONIN 5 MG PO TABS
10.0000 mg | ORAL_TABLET | ORAL | Status: AC
  Administered 2020-11-29: 10 mg via ORAL
  Filled 2020-11-29: qty 2

## 2020-11-29 NOTE — Discharge Instructions (Addendum)
The running suture on the forehead can be removed in 10 days, around July 10.  Goals of care were discussed with the patient's daughter.  In consideration of her advanced dementia and DNR CODE STATUS, we agree that she does not need to be sent to the hospital for CT imaging for falls and head injury if there are not other issues such as laceration or acute pain.

## 2020-11-29 NOTE — ED Notes (Addendum)
ED Provider remains at bedside for sutures. Pt resting calmly.

## 2020-11-29 NOTE — ED Notes (Signed)
Daughter requesting patient's night meds due to missing them while awaiting discharge and transport. Dr Scotty Court notified.

## 2020-11-29 NOTE — ED Provider Notes (Signed)
[facility] Emergency Department Provider Note  ____________________________________________  Time seen: Approximately 9:30 PM  I have reviewed the triage vital signs and the nursing notes.   HISTORY  Chief Complaint Fall and Head Laceration    Level 5 Caveat: Portions of the History and Physical including HPI and review of systems are unable to be completely obtained due to patient advanced dementia and altered mental status  Additional history obtained from patient's daughter at bedside  HPI Teresa Richard is a 80 y.o. female with a history of dementia, hypothyroidism who was brought to the ED due to reported mechanical fall.  This is a frequent occurrence for the patient.  She sustained a head wound with bleeding.  She is in baseline mental status according to daughter.  No recent illness or acute symptoms.  She is otherwise in her usual state of health.   Goals of care were discussed with the patient's daughter.  In consideration of her advanced dementia and DNR CODE STATUS, we agree that she does not need to be sent to the hospital for CT imaging for falls and head injury if there are not other issues such as laceration or acute pain.    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     Patient Active Problem List   Diagnosis Date Noted   Protein-calorie malnutrition, severe 10/30/2020   Closed right hip fracture, initial encounter (HCC) 10/26/2020   Hip fracture (HCC) 10/26/2020   COVID-19 virus infection 02/16/2019   B12 deficiency anemia 02/16/2019   Syncope 02/09/2019   Pancytopenia (HCC) 02/09/2019   H/O cardiac pacemaker 08/30/2015   Thyroid disease 01/23/2014   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     Past Surgical History:   Procedure Laterality Date   ANTERIOR APPROACH HEMI HIP ARTHROPLASTY Right 10/31/2020   Procedure: HEMIARTHROPLASTY;  Surgeon: Samson Frederic, MD;  Location: [facility] OR;  Service: Orthopedics;  Laterality: Right;   BREAST CYST EXCISION Left 1980's   negative   CATARACT EXTRACTION W/PHACO Left 11/11/2016   Procedure: CATARACT EXTRACTION PHACO AND INTRAOCULAR LENS PLACEMENT (IOC);  Surgeon: Galen Manila, MD;  Location: [facility] ORS;  Service: Ophthalmology;  Laterality: Left;  [place]  00:55 AP% 14.7 CDE 8.15 Fluid pack lot # [id number]   CTR Right    EYE SURGERY Bilateral    cataract extractions   FRACTURE SURGERY Right 2005   LEG. nuts and bolts in knee and foot   IMPLANTABLE CARDIOVERTER DEFIBRILLATOR (ICD) GENERATOR CHANGE Left 07/07/2018   Procedure: PACER CHANGEOUT;  Surgeon: Marcina Millard, MD;  Location: [facility] ORS;  Service: Cardiovascular;  Laterality: Left;   INSERT / REPLACE / REMOVE PACEMAKER     PACEMAKER INSERTION     RCR Left 2008     Prior to Admission medications   Medication Sig Start Date End Date Taking? Authorizing Provider  acetaminophen (TYLENOL) 500 MG tablet Take 500 mg by mouth every 6 (six) hours as needed for mild pain or fever.    [provider]  ALPRAZolam Prudy Feeler) 0.5 MG tablet Take 1 tablet (0.5 mg total) by mouth 2 (two) times daily as needed for anxiety. 11/05/20   Joseph Art, DO  aspirin (ASPIRIN CHILDRENS) 81 MG  chewable tablet Chew 1 tablet (81 mg total) by mouth 2 (two) times daily with a meal. 11/01/20 12/16/20  Barrie DunkerMcCauley, Larry B, PA  busPIRone (BUSPAR) 5 MG tablet Take 7.5-10 mg by mouth See admin instructions. 10 mg in the morning and at bedtime. 7.5 mg in the afternoon    [provider]  Cholecalciferol (VITAMIN D3) 50 MCG (2000 UT) TABS Take 2,000 Units by mouth daily.    [provider]  diclofenac Sodium (VOLTAREN) 1 % GEL Apply 2 g topically in the morning and at bedtime. Right knee    [provider]  divalproex  (DEPAKOTE SPRINKLE) 125 MG capsule Take 125 mg by mouth 2 (two) times daily.    [provider]  Elastic Bandages & Supports (MEDICAL COMPRESSION STOCKINGS) MISC 1 application See admin instructions. Apply in the morning and remove at bedtime    [provider]  escitalopram (LEXAPRO) 10 MG tablet Take 10 mg by mouth daily.    [provider]  ferrous sulfate 325 (65 FE) MG tablet Take 325 mg by mouth daily with breakfast.    [provider]  levothyroxine (SYNTHROID, LEVOTHROID) 75 MCG tablet Take 75 mcg by mouth every morning.     [provider]  loperamide (IMODIUM A-D) 2 MG tablet Take 2-4 mg by mouth See admin instructions. 4 mg after first loose stool, then 2 mg after each loose stool. Max 4 tabs in 24 hours    [provider]  magnesium oxide (MAG-OX) 400 MG tablet Take 400 mg by mouth daily.    [provider]  Melatonin 10 MG TABS Take 10 mg by mouth at bedtime.    [provider]  Pimavanserin Tartrate (NUPLAZID) 34 MG CAPS Take 34 mg by mouth daily.    [provider]  QUEtiapine (SEROQUEL) 100 MG tablet Take 100 mg by mouth at bedtime.    [provider]  QUEtiapine (SEROQUEL) 50 MG tablet Take 75 mg by mouth in the morning.    [provider]  traMADol (ULTRAM) 50 MG tablet Take 1 tablet (50 mg total) by mouth every 6 (six) hours as needed for up to 28 days. 11/01/20 11/29/20  Darrick GrinderMcCauley, Larry B, PA  vitamin B-12 (CYANOCOBALAMIN) 500 MCG tablet Take 500 mcg by mouth daily.    [provider]     Allergies Hydrocodone and Oxycodone   Family History  Problem Relation Age of Onset   Breast cancer Neg Hx     Social History Social History   Tobacco Use   Smoking status: Never   Smokeless tobacco: Never  Vaping Use   Vaping Use: Never used  Substance Use Topics   Alcohol use: No   Drug use: No    Review of Systems Level 5 Caveat: Portions of the History and Physical  including HPI and review of systems are unable to be completely obtained due to patient being a poor historian   Constitutional:   No known fever.  ENT:   No rhinorrhea. Cardiovascular:   No chest pain or syncope. Respiratory:   No dyspnea or cough. Gastrointestinal:   Negative for abdominal pain, vomiting and diarrhea.  Musculoskeletal:   Negative for focal pain or swelling ____________________________________________   PHYSICAL EXAM:  VITAL SIGNS: ED Triage Vitals  Enc Vitals Group     BP 11/29/20 1809 111/61     Pulse Rate 11/29/20 1809 96     Resp 11/29/20 1809 18     Temp 11/29/20  1809 99.3 F (37.4 C)     Temp Source 11/29/20 1809 Oral     SpO2 11/29/20 1809 95 %     Weight 11/29/20 1809 105 lb (47.6 kg)     Height 11/29/20 1809 5\' 5"  (1.651 m)     Head Circumference --      Peak Flow --      Pain Score 11/29/20 2110 Asleep     Pain Loc --      Pain Edu? --      Excl. in GC? --     Vital signs reviewed, nursing assessments reviewed.   Constitutional:   Awake and alert.  Not oriented. Non-toxic appearance. Eyes:   Conjunctivae are normal. EOMI. PERRL. ENT      Head:   Normocephalic with 3 cm laceration at the midline upper forehead, horizontally oriented, not involving the glabella.  Carefully combed through the patient's hair, raking aside matted blood to visualize the scalp.  No other injuries.      Nose:   No congestion/rhinnorhea.  No epistaxis      Mouth/Throat:   MMM, no pharyngeal erythema. No peritonsillar mass.  No intraoral injury      Neck:   No meningismus. Full ROM.  No midline C-spine tenderness Hematological/Lymphatic/Immunilogical:   No cervical lymphadenopathy. Cardiovascular:   RRR. Symmetric bilateral radial and DP pulses.  No murmurs. Cap refill less than 2 seconds. Respiratory:   Normal respiratory effort without tachypnea/retractions. Breath sounds are clear and equal bilaterally. No wheezes/rales/rhonchi. Gastrointestinal:   Soft and  nontender. Non distended. There is no CVA tenderness.  No rebound, rigidity, or guarding. Genitourinary:   deferred Musculoskeletal:   Normal range of motion in all extremities. No joint effusions.  No lower extremity tenderness.  No edema. Neurologic:   Baseline speech and interaction Motor grossly intact. No acute focal neurologic deficits are appreciated.  Skin:    Skin is warm, dry with head laceration as above, no other injury noted. No rash noted.  No petechiae, purpura, or bullae.  ____________________________________________    LABS (pertinent positives/negatives) (all labs ordered are listed, but only abnormal results are displayed) Labs Reviewed - No data to display ____________________________________________   EKG    ____________________________________________    RADIOLOGY  No results found.  ____________________________________________   PROCEDURES .2111Laceration Repair  Date/Time: 11/29/2020 9:33 PM Performed by: 12/01/2020, MD Authorized by: Sharman Cheek, MD   Consent:    Consent obtained:  Verbal   Consent given by:  Healthcare agent   Risks, benefits, and alternatives were discussed: yes     Risks discussed:  Infection, pain, retained foreign body, poor cosmetic result and poor wound healing   Alternatives discussed:  No treatment Universal protocol:    Patient identity confirmed:  Arm band Anesthesia:    Anesthesia method:  Local infiltration   Local anesthetic:  Lidocaine 2% WITH epi Laceration details:    Location:  Scalp   Scalp location:  Frontal   Length (cm):  3 Pre-procedure details:    Preparation:  Patient was prepped and draped in usual sterile fashion Exploration:    Hemostasis achieved with:  Direct pressure and epinephrine   Wound exploration: entire depth of wound visualized     Wound extent: no foreign bodies/material noted, no muscle damage noted, no underlying fracture noted and no vascular damage noted      Contaminated: no   Treatment:    Area cleansed with:  Saline and povidone-iodine   Amount of cleaning:  Extensive   Irrigation solution:  Sterile saline   Visualized foreign bodies/material removed: no     Debridement:  Minimal   Undermining:  Minimal Skin repair:    Repair method:  Sutures   Suture size:  6-0   Suture material:  Nylon   Suture technique:  Running locked   Number of sutures:  6 Approximation:    Approximation:  Close Repair type:    Repair type:  Simple Post-procedure details:    Dressing:  Sterile dressing   Procedure completion:  Tolerated well, no immediate complications  ____________________________________________    CLINICAL IMPRESSION / ASSESSMENT AND PLAN / ED COURSE  Medications ordered in the ED: Medications  busPIRone (BUSPAR) tablet 10 mg (has no administration in time range)  divalproex (DEPAKOTE SPRINKLE) capsule 125 mg (has no administration in time range)  melatonin tablet 10 mg (has no administration in time range)  QUEtiapine (SEROQUEL) tablet 100 mg (has no administration in time range)  lidocaine-EPINEPHrine (XYLOCAINE W/EPI) 2 %-1:100000 (with pres) injection 20 mL (20 mLs Infiltration Given by Other 11/29/20 1911)  haloperidol lactate (HALDOL) injection 2.5 mg (2.5 mg Intramuscular Given 11/29/20 1804)    Pertinent labs & imaging results that were available during my care of the patient were reviewed by me and considered in my medical decision making (see chart for details).   Jaylon Grode was evaluated in Emergency Department on 11/29/2020 for the symptoms described in the history of present illness. She was evaluated in the context of the global COVID-19 pandemic, which necessitated consideration that the patient might be at risk for infection with the SARS-CoV-2 virus that causes COVID-19. Institutional protocols and algorithms that pertain to the evaluation of patients at risk for COVID-19 are in a state of rapid change based on  information released by regulatory bodies including the CDC and federal and state organizations. These policies and algorithms were followed during the patient's care in the ED.     Clinical Course as of 11/29/20 2130  Thu Nov 29, 2020  1916 Patient presents with mechanical fall from [facility] memory care.  She has a laceration on the forehead which is cleaned and repaired.  Engaged in shared decision-making with her daughter at bedside regarding repeat CT imaging of her head to evaluate for ICH.  Daughter confirms that she is DNR, and with her advanced dementia, she would not undergo brain surgery for ICH.  Therefore, daughter agrees with deferring imaging.   [PS]    Clinical Course User Index [PS] Sharman Cheek, MD    ----------------------------------------- 9:33 PM on 11/29/2020 ----------------------------------------- Laceration repaired, vital signs stable, mental status stable.  Suitable for discharge back to [facility] for further care.   ____________________________________________   FINAL CLINICAL IMPRESSION(S) / ED DIAGNOSES    Final diagnoses:  Laceration of scalp, initial encounter  Advanced dementia [facility])     ED Discharge Orders     None       Portions of this note were generated with dragon dictation software. Dictation errors may occur despite best attempts at proofreading.   Sharman Cheek, MD 11/29/20 2134

## 2020-11-29 NOTE — ED Triage Notes (Signed)
Patient brought in by EMS coming from [facility] for mechanical fall. Combat gauze placed by fire on scene. Laceration to right head- unable to visualize due to head wrap/bleeding and patient being agitated and grabbing staff.

## 2020-11-29 NOTE — ED Notes (Signed)
Daughter at bedside at this time providing emotional support and redirection to patient.   Mittens applied for safety due to pt grabbing at staff and monitor.   Gauze applied to forehead laceration.

## 2020-12-15 ENCOUNTER — Emergency Department: Payer: Medicare Other

## 2020-12-15 ENCOUNTER — Emergency Department
Admission: EM | Admit: 2020-12-15 | Discharge: 2020-12-15 | Disposition: A | Discharge status: Home / Self Care | Payer: Medicare Other | Attending: Emergency Medicine | Admitting: Emergency Medicine

## 2020-12-15 ENCOUNTER — Other Ambulatory Visit: Payer: Self-pay

## 2020-12-15 DIAGNOSIS — Z950 Presence of cardiac pacemaker: Secondary | ICD-10-CM | POA: Insufficient documentation

## 2020-12-15 DIAGNOSIS — W19XXXA Unspecified fall, initial encounter: Secondary | ICD-10-CM | POA: Insufficient documentation

## 2020-12-15 DIAGNOSIS — S0181XA Laceration without foreign body of other part of head, initial encounter: Secondary | ICD-10-CM | POA: Diagnosis not present

## 2020-12-15 DIAGNOSIS — F0390 Unspecified dementia without behavioral disturbance: Secondary | ICD-10-CM | POA: Diagnosis not present

## 2020-12-15 DIAGNOSIS — Z8616 Personal history of COVID-19: Secondary | ICD-10-CM | POA: Insufficient documentation

## 2020-12-15 DIAGNOSIS — Z7982 Long term (current) use of aspirin: Secondary | ICD-10-CM | POA: Diagnosis not present

## 2020-12-15 DIAGNOSIS — E039 Hypothyroidism, unspecified: Secondary | ICD-10-CM | POA: Insufficient documentation

## 2020-12-15 DIAGNOSIS — S0990XA Unspecified injury of head, initial encounter: Secondary | ICD-10-CM | POA: Diagnosis present

## 2020-12-15 DIAGNOSIS — Z79899 Other long term (current) drug therapy: Secondary | ICD-10-CM | POA: Insufficient documentation

## 2020-12-15 NOTE — Discharge Instructions (Addendum)
Please keep wound and dry. If she develops any severe bleeding or other worsening, please return to the ER. Otherwise, follow up with provider for her facility.

## 2020-12-15 NOTE — ED Triage Notes (Signed)
Pt from [facility] with unwitnessed fall. Pt with laceration to right forehead. Pt is not able to articulate any complaints and does not answer questions. Pt with history of dementia.

## 2020-12-15 NOTE — ED Provider Notes (Signed)
Emergency Medicine Provider Triage Evaluation Note  Teresa Richard , a 80 y.o. female  was evaluated upon arrival by EMS.  Patient has advanced dementia limiting her ability to give a history.  She reportedly was found down at her facility.  She has some bruising around both of her eyes that appears older but is difficult to assess exactly when it began.  She had lacerations on her forehead on the right side which are currently wrapped and not actively bleeding.  No history of anticoagulation.  Patient has no complaints at this time.  Review of Systems  Positive: Fall, head laceration, chronic dementia Negative: Chest pain, shortness of breath  Physical Exam  Vitals pending.  Gen:   Awake, confused, no acute distress.   Resp:  Normal effort , no accessory muscle usage MSK:   Moves extremities without difficulty.  No pain in arms or legs. Other:  Obvious head trauma with dried blood but bleeding is well controlled.  Bilateral minimal " raccoon eyes" although it may be subacute.  Medical Decision Making  Medically screening exam initiated at 5:10 AM.  Appropriate orders placed.  Teresa Richard was informed that the remainder of the evaluation will be completed by another provider, this initial triage assessment does not replace that evaluation, and the importance of remaining in the ED until their evaluation is complete.  CT head, C-spine, and temporal bones without contrast ordered given the patient's dementia and obvious head trauma.  Ordered CT temporal bones to make sure there is no evidence of basilar skull fracture.  No indication for lab work at this time.  Patient in no acute distress.   Loleta Rose, MD 12/15/20 [id number]

## 2020-12-15 NOTE — ED Provider Notes (Signed)
[facility] Emergency Department Provider Note  ____________________________________________   Event Date/Time   First MD Initiated Contact with Patient 12/15/20 [phone]     (approximate)  I have reviewed the triage vital signs and the nursing notes.   HISTORY  Chief Complaint Fall  HPI Teresa Richard is a 80 y.o. female who presents to the urgency department from [facility] with complaint of an unwitnessed fall.  The patient's daughter and documented power of attorney is present, providing the majority of the history.  Patient has significant dementia and is not able to articulate any complaints, does not answer any questions appropriately as per her norm.  Daughter reports that they believe the patient got out of bed without using her walker, and ended up on the floor.  She notes that the patient is a DNR, and family would like minimal intervention.    Family notes that she primarily is wheelchair-bound at baseline except for transfers with the assistance of a walker which is new since traumatic hip fracture and replacement in May.      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    Patient Active Problem List   Diagnosis Date Noted   Protein-calorie malnutrition, severe 10/30/2020   Closed right hip fracture, initial encounter (HCC) 10/26/2020   Hip fracture (HCC) 10/26/2020   COVID-19 virus infection 02/16/2019   B12 deficiency anemia 02/16/2019   Syncope 02/09/2019   Pancytopenia (HCC) 02/09/2019   H/O cardiac pacemaker 08/30/2015   Thyroid disease 01/23/2014   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    Past Surgical History:  Procedure Laterality Date   ANTERIOR APPROACH HEMI HIP ARTHROPLASTY Right 10/31/2020   Procedure: HEMIARTHROPLASTY;   Surgeon: Samson Frederic, MD;  Location: [facility] OR;  Service: Orthopedics;  Laterality: Right;   BREAST CYST EXCISION Left 1980's   negative   CATARACT EXTRACTION W/PHACO Left 11/11/2016   Procedure: CATARACT EXTRACTION PHACO AND INTRAOCULAR LENS PLACEMENT (IOC);  Surgeon: Galen Manila, MD;  Location: [facility] ORS;  Service: Ophthalmology;  Laterality: Left;  [place]  00:55 AP% 14.7 CDE 8.15 Fluid pack lot # [id number]   CTR Right    EYE SURGERY Bilateral    cataract extractions   FRACTURE SURGERY Right 2005   LEG. nuts and bolts in knee and foot   IMPLANTABLE CARDIOVERTER DEFIBRILLATOR (ICD) GENERATOR CHANGE Left 07/07/2018   Procedure: PACER CHANGEOUT;  Surgeon: Marcina Millard, MD;  Location: [facility] ORS;  Service: Cardiovascular;  Laterality: Left;   INSERT / REPLACE / REMOVE PACEMAKER     PACEMAKER INSERTION     RCR Left 2008    Prior to Admission medications   Medication Sig Start Date End Date Taking? Authorizing Provider  acetaminophen (TYLENOL) 500 MG tablet Take 500 mg by mouth every 6 (six) hours as needed for mild pain or fever.    [provider]  ALPRAZolam Prudy Feeler) 0.5 MG tablet Take 1 tablet (0.5 mg total) by mouth 2 (two) times daily as needed for anxiety. 11/05/20   Joseph Art, DO  aspirin (ASPIRIN CHILDRENS) 81 MG chewable tablet Chew 1 tablet (81 mg total) by mouth 2 (two) times daily with a meal. 11/01/20 12/16/20  Darrick Grinder, PA  busPIRone (BUSPAR) 5 MG tablet Take 7.5-10 mg by mouth See admin instructions. 10 mg in the morning and at bedtime. 7.5 mg in the afternoon    [provider]  Cholecalciferol (VITAMIN D3) 50 MCG (2000 UT) TABS Take 2,000 Units by mouth daily.    [provider]  diclofenac Sodium (VOLTAREN) 1 % GEL Apply 2 g topically in the morning and at bedtime. Right knee    [provider]  divalproex (DEPAKOTE SPRINKLE) 125 MG capsule Take 125 mg by mouth 2 (two) times daily.    [provider]  Elastic  Bandages & Supports (MEDICAL COMPRESSION STOCKINGS) MISC 1 application See admin instructions. Apply in the morning and remove at bedtime    [provider]  escitalopram (LEXAPRO) 10 MG tablet Take 10 mg by mouth daily.    [provider]  ferrous sulfate 325 (65 FE) MG tablet Take 325 mg by mouth daily with breakfast.    [provider]  levothyroxine (SYNTHROID, LEVOTHROID) 75 MCG tablet Take 75 mcg by mouth every morning.     [provider]  loperamide (IMODIUM A-D) 2 MG tablet Take 2-4 mg by mouth See admin instructions. 4 mg after first loose stool, then 2 mg after each loose stool. Max 4 tabs in 24 hours    [provider]  magnesium oxide (MAG-OX) 400 MG tablet Take 400 mg by mouth daily.    [provider]  Melatonin 10 MG TABS Take 10 mg by mouth at bedtime.    [provider]  Pimavanserin Tartrate (NUPLAZID) 34 MG CAPS Take 34 mg by mouth daily.    [provider]  QUEtiapine (SEROQUEL) 100 MG tablet Take 100 mg by mouth at bedtime.    [provider]  QUEtiapine (SEROQUEL) 50 MG tablet Take 75 mg by mouth in the morning.    [provider]  vitamin B-12 (CYANOCOBALAMIN) 500 MCG tablet Take 500 mcg by mouth daily.    [provider]    Allergies Hydrocodone and Oxycodone  Family History  Problem Relation Age of Onset   Breast cancer Neg Hx     Social History Social History   Tobacco Use   Smoking status: Never   Smokeless tobacco: Never  Vaping Use   Vaping Use: Never used  Substance Use Topics   Alcohol use: No   Drug use: No    Review of Systems Review of systems significantly limited by patient's documented chronic dementia   ____________________________________________   PHYSICAL EXAM:  VITAL SIGNS: ED Triage Vitals  Enc Vitals Group     BP 12/15/20 0518 (!) 149/59     Pulse Rate 12/15/20 0518 78     Resp 12/15/20 0518 14     Temp 12/15/20 0518 98 F  (36.7 C)     Temp Source 12/15/20 0518 Axillary     SpO2 12/15/20 0518 96 %     Weight 12/15/20 0519 110 lb 3.7 oz (50 kg)     Height 12/15/20 0519 5\' 4"  (1.626 m)     Head Circumference --      Peak Flow --      Pain Score --      Pain Loc --      Pain Edu? --      Excl. in GC? --    Constitutional: Sitting, appearing somewhat comfortable in wheelchair.  Speech is intermittently comprehensible, patient combative which is her documented baseline. Eyes: Conjunctivae are normal. PERRL. EOMI.  Head: Significant bruising with laceration to the right forehead. Nose: No congestion/rhinnorhea. Mouth/Throat: Mucous membranes are moist.   Neck: No stridor.  No tenderness to palpation noted of the cervical spine. Cardiovascular: Normal rate, regular rhythm.  Respiratory: Normal respiratory effort.  No retractions. Gastrointestinal: Soft and nontender. No distention. No abdominal bruits. No CVA tenderness. Musculoskeletal: No lower extremity tenderness nor edema.  No joint effusions. Neurologic: Speech and language chronically abnormal, no acute deficits appreciated.  No gross focal neurologic deficits are appreciated. No gait instability. Skin:  Skin is warm, dry and intact except as described above. No rash noted. Psychiatric: Mood and affect are normal. Speech and behavior are normal.  ____________________________________________  RADIOLOGY  Official radiology report(s): CT Head Wo Contrast  Result Date: 12/15/2020 CLINICAL DATA:  80 year old female status post unwitnessed fall. Forehead laceration. Bruising around the orbits. EXAM: CT HEAD WITHOUT CONTRAST TECHNIQUE: Contiguous axial images were obtained from the base of the skull through the vertex without intravenous contrast. COMPARISON:  Head CT 11/26/2020. FINDINGS: Brain: Cerebral volume is stable, with disproportionate volume loss in the anterior temporal lobes. No midline shift, ventriculomegaly, mass effect, evidence of mass  lesion, intracranial hemorrhage or evidence of cortically based acute infarction. Gray-white matter differentiation is stable and normal for age. Vascular: Calcified atherosclerosis at the skull base. No suspicious intracranial vascular hyperdensity. Skull: Stable. No fracture identified. Nasal bones appear stable, intact. Sinuses/Orbits: Visualized paranasal sinuses and mastoids are stable and well aerated. Other: Right forehead scalp soft tissue injury with skin irregularity and mild broad-based hematoma. No scalp soft tissue gas. Underlying right frontal bone appears stable and intact. Other orbit and scalp soft tissues appear stable, negative. IMPRESSION: 1. Right forehead scalp soft tissue injury without underlying skull fracture. 2. Stable non contrast CT appearance of the brain. No acute intracranial abnormality. Electronically Signed   By: Odessa Fleming M.D.   On: 12/15/2020 06:24   CT Cervical Spine Wo Contrast  Result Date: 12/15/2020 CLINICAL DATA:  80 year old female status post unwitnessed fall. Forehead laceration. Bruising around the orbits. EXAM: CT CERVICAL SPINE WITHOUT CONTRAST TECHNIQUE: Multidetector CT imaging of the cervical spine was performed without intravenous contrast. Multiplanar CT image reconstructions were also generated. COMPARISON:  Head and temporal bone CT today reported separately. Cervical spine CT 11/26/2020. FINDINGS: Alignment: Stable mild straightening of lordosis, subtle degenerative appearing anterolisthesis at C3-C4 and C4-C5. Cervicothoracic junction alignment is within normal limits. Bilateral posterior element alignment is within normal limits. Skull base and vertebrae: Osteopenia. Visualized skull base is intact. No atlanto-occipital dissociation. C1 and C2 appear intact and aligned. No acute osseous abnormality identified. Soft tissues and spinal canal: No prevertebral fluid or swelling. No visible canal hematoma. Negative noncontrast visible neck soft tissues aside  from intravascular hypodensity raising the possibility of anemia. Disc levels:  Stable mild for age cervical spine degeneration. Upper chest: Visible upper thoracic levels appear stable and intact. Negative lung apices. Partially visible left subclavian approach cardiac pacemaker type leads. IMPRESSION: 1. No acute traumatic injury identified in the cervical spine. Mild for age degenerative changes. 2. Intravascular hypodensity raising the possibility of anemia. Electronically Signed   By: Odessa Fleming M.D.   On: 12/15/2020 06:28   CT Temporal Bones Wo Contrast  Result Date: 12/15/2020 CLINICAL DATA:  80 year old female status post unwitnessed fall. Forehead laceration. Bruising around the orbits. EXAM: CT TEMPORAL BONES WITHOUT CONTRAST TECHNIQUE: Axial and coronal plane CT imaging of the petrous temporal bones was performed with thin-collimation image reconstruction. No intravenous contrast  was administered. Multiplanar CT image reconstructions were also generated. COMPARISON:  Head and cervical spine CT today reported separately. Face CT 11/26/2020. FINDINGS: Vascular: Calcified atherosclerosis at the skull base. Limited intracranial:  Stable to that reported separately today. Visible orbits/paranasal sinuses: Negative orbits soft tissues. Paranasal sinuses are clear aside from mild mucosal thickening in the right maxillary alveolar recess. Maxillary dentition absent. Soft tissues: Right forehead scalp soft tissue injury. Negative visible noncontrast deep soft tissue spaces of the face. Generalized osteopenia at the skull base. LEFT TEMPORAL BONE: Debris in the superficial external auditory canal which otherwise appears negative. Normal left tympanic membrane. Left tympanic cavity is clear. Left scutum and ossicles appear intact. Left mastoid antrum and air cells are clear. Left IAC, cochlea, vestibule and vestibular aqueduct are within normal limits. Negative left semicircular canals, course of the left 7th  nerve. RIGHT TEMPORAL BONE: Minimal debris in the external right EAC. Normal right tympanic membrane. Right tympanic cavity is clear. Right scutum and ossicles are intact. Right mastoid antrum and air cells are clear. Right IAC, cochlea, vestibule and vestibular aqueduct are within normal limits. Right semicircular canals and course of the right 7th nerve are normal. IMPRESSION: 1. Negative CT appearance of the temporal bones.  Intact skull base. 2. Minimal mucosal thickening in the right maxillary alveolar recess. 3. Head CT reported separately. Electronically Signed   By: Odessa FlemingH  Hall M.D.   On: 12/15/2020 06:32    ____________________________________________   PROCEDURES  Procedure(s) performed (including Critical Care):  Marland Kitchen.Marland Kitchen.Laceration Repair  Date/Time: 12/15/2020 11:56 AM Performed by: Lucy Chrisodgers, Howie Rufus J, PA Authorized by: Lucy Chrisodgers, Dyasia Firestine J, PA   Consent:    Consent obtained:  Verbal   Consent given by:  Healthcare agent   Risks, benefits, and alternatives were discussed: yes     Risks discussed:  Infection, retained foreign body, poor cosmetic result, need for additional repair and poor wound healing   Alternatives discussed:  No treatment, observation and referral Universal protocol:    Procedure explained and questions answered to patient or proxy's satisfaction: yes     Imaging studies available: yes     Patient identity confirmed:  Arm band Anesthesia:    Anesthesia method:  None Laceration details:    Location:  Face   Face location:  Forehead   Length (cm):  1 Exploration:    Hemostasis achieved with:  Direct pressure   Imaging obtained comment:  CT   Imaging outcome: foreign body not noted     Wound exploration: wound explored through full range of motion   Treatment:    Area cleansed with:  Chlorhexidine and saline Skin repair:    Repair method:  Steri-Strips   Number of Steri-Strips:  3 Approximation:    Approximation:  Loose Repair type:    Repair type:   Simple Post-procedure details:    Dressing:  Bulky dressing   Procedure completion:  Tolerated with difficulty   ____________________________________________   INITIAL IMPRESSION / ASSESSMENT AND PLAN / ED COURSE  As part of my medical decision making, I reviewed the following data within the electronic MEDICAL RECORD NUMBER History obtained from family, Nursing notes reviewed and incorporated, and Notes from prior ED visits        Patient is an 80 year old female with severe documented dementia who [facility] emergency [facility]  There was an unknown amount of time that she was down, noted head trauma of the right forehead.  Unknown loss of  consciousness.  Primary history provided by daughter, who is present with the patient and is her documented healthcare power of attorney.  Daughter states that this is the second fall within 2 weeks requiring stitches and/or staples to her face when she attempted to get up unattended.  She has discussed with her siblings as well as previous medical providers that patient is DNR and they would likely not attempt any treatment would there be a finding to treat, but that she would like her mother to be comfortable.  In triage patient has normal vital signs.  Physical exam as above with 1 cm laceration with surrounding ecchymosis to the right forehead region.  CT was obtained from triage triage after initial medical evaluation with a CT of the head, temporal bones and cervical spine.  These were negative for any acute fractures or acute bleeds.  Discussed the findings with the daughter.  Given that this was an unwitnessed fall, typical evaluation would include laboratory evaluation, EKG and other work-up for cause, however patient's daughter declines this work-up again stating that she is a DNR and they likely would not treat any underlying conditions found so long as patient was comfortable.  She does have a 1 cm  laceration requiring repair of the right forehead.  This is more of a skin tear type of injury not likely amenable with suturing, nor with the patient tolerate this as she is intermittently combative, unable to understand what is being treated.  Steri-Strips were provided for closure of this wound, which the patient tolerated but with difficulty and needing her hands restrained by both her daughter and a nurse.  This was very brief and otherwise she tolerated well.  Did provide a pressure dressing for her to return home to her facility with given that there was still oozing from the wound.  Discussed the findings with the daughter, she is agreeable to the work-up provided and wishes for no further intervention or work-up.  She mostly would like to get her home before her next doses of medications.  I feel this is reasonable given her status.  The patient is grossly at her baseline, and return precautions were discussed at length with the daughter.      ____________________________________________   FINAL CLINICAL IMPRESSION(S) / ED DIAGNOSES  Final diagnoses:  Injury of head, initial encounter  Facial laceration, initial encounter  Fall, initial encounter     ED Discharge Orders     None        Note:  This document was prepared using Dragon voice recognition software and may include unintentional dictation errors.    Lucy Chris, PA 12/15/20 1201    Jene Every, MD 12/15/20 1440

## 2020-12-21 ENCOUNTER — Other Ambulatory Visit: Payer: Self-pay

## 2020-12-21 ENCOUNTER — Emergency Department: Payer: Medicare Other

## 2020-12-21 ENCOUNTER — Emergency Department
Admission: EM | Admit: 2020-12-21 | Discharge: 2020-12-21 | Disposition: A | Discharge status: Home / Self Care | Payer: Medicare Other | Attending: Student in an Organized Health Care Education/Training Program | Admitting: Student in an Organized Health Care Education/Training Program

## 2020-12-21 DIAGNOSIS — E039 Hypothyroidism, unspecified: Secondary | ICD-10-CM | POA: Insufficient documentation

## 2020-12-21 DIAGNOSIS — Z8616 Personal history of COVID-19: Secondary | ICD-10-CM | POA: Insufficient documentation

## 2020-12-21 DIAGNOSIS — S0003XA Contusion of scalp, initial encounter: Secondary | ICD-10-CM | POA: Insufficient documentation

## 2020-12-21 DIAGNOSIS — Z950 Presence of cardiac pacemaker: Secondary | ICD-10-CM | POA: Diagnosis not present

## 2020-12-21 DIAGNOSIS — W07XXXA Fall from chair, initial encounter: Secondary | ICD-10-CM | POA: Diagnosis not present

## 2020-12-21 DIAGNOSIS — S0990XA Unspecified injury of head, initial encounter: Secondary | ICD-10-CM | POA: Diagnosis present

## 2020-12-21 DIAGNOSIS — Z79899 Other long term (current) drug therapy: Secondary | ICD-10-CM | POA: Diagnosis not present

## 2020-12-21 DIAGNOSIS — W19XXXA Unspecified fall, initial encounter: Secondary | ICD-10-CM

## 2020-12-21 DIAGNOSIS — F0390 Unspecified dementia without behavioral disturbance: Secondary | ICD-10-CM | POA: Insufficient documentation

## 2020-12-21 LAB — TROPONIN I (HIGH SENSITIVITY): Troponin I (High Sensitivity): 6 ng/L (ref <18)

## 2020-12-21 LAB — BASIC METABOLIC PANEL
Anion gap: 4 — ABNORMAL LOW (ref 5–15)
BUN: 26 mg/dL — ABNORMAL HIGH (ref 8–23)
CO2: 33 mmol/L — ABNORMAL HIGH (ref 22–32)
Calcium: 9.2 mg/dL (ref 8.9–10.3)
Chloride: 104 mmol/L (ref 98–111)
Creatinine, Ser: 0.57 mg/dL (ref 0.44–1.00)
GFR, Estimated: >60 mL/min (ref >60)
Glucose, Bld: 88 mg/dL (ref 70–99)
Potassium: 4.3 mmol/L (ref 3.5–5.1)
Sodium: 141 mmol/L (ref 135–145)

## 2020-12-21 LAB — CBC
HCT: 30.7 % — ABNORMAL LOW (ref 36.0–46.0)
Hemoglobin: 9.3 g/dL — ABNORMAL LOW (ref 12.0–15.0)
MCH: 29.7 pg (ref 26.0–34.0)
MCHC: 30.3 g/dL (ref 30.0–36.0)
MCV: 98.1 fL (ref 80.0–100.0)
Platelets: 206 10*3/uL (ref 150–400)
RBC: 3.13 MIL/uL — ABNORMAL LOW (ref 3.87–5.11)
RDW: 16.2 % — ABNORMAL HIGH (ref 11.5–15.5)
WBC: 3.1 10*3/uL — ABNORMAL LOW (ref 4.0–10.5)
nRBC: 0 % (ref 0.0–0.2)

## 2020-12-21 NOTE — ED Provider Notes (Signed)
[facility] Emergency Department Provider Note    Event Date/Time   First MD Initiated Contact with Patient 12/21/20 1406     (approximate)  I have reviewed the triage vital signs and the nursing notes.   HISTORY  Chief Complaint Fall    HPI Teresa Richard is a 80 y.o. female with dementia presents from with cure-all for mechanical fall after she got up out of her chair and fell backwards.  She isto get up without assistance.  No LOC.  She is accompanied by her grand daughter states that she is acting appropriately.  No other concerns.  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   Family History  Problem Relation Age of Onset   Breast cancer Neg Hx    Past Surgical History:  Procedure Laterality Date   ANTERIOR APPROACH HEMI HIP ARTHROPLASTY Right 10/31/2020   Procedure: HEMIARTHROPLASTY;  Surgeon: Samson Frederic, MD;  Location: [facility] OR;  Service: Orthopedics;  Laterality: Right;   BREAST CYST EXCISION Left 1980's   negative   CATARACT EXTRACTION W/PHACO Left 11/11/2016   Procedure: CATARACT EXTRACTION PHACO AND INTRAOCULAR LENS PLACEMENT (IOC);  Surgeon: Galen Manila, MD;  Location: [facility] ORS;  Service: Ophthalmology;  Laterality: Left;  [place]  00:55 AP% 14.7 CDE 8.15 Fluid pack lot # [id number]   CTR Right    EYE SURGERY Bilateral    cataract extractions   FRACTURE SURGERY Right 2005   LEG. nuts and bolts in knee and foot   IMPLANTABLE CARDIOVERTER DEFIBRILLATOR (ICD) GENERATOR CHANGE Left 07/07/2018   Procedure: PACER CHANGEOUT;  Surgeon: Marcina Millard, MD;  Location: [facility] ORS;  Service: Cardiovascular;  Laterality: Left;   INSERT / REPLACE / REMOVE PACEMAKER     PACEMAKER INSERTION     RCR Left 2008   Patient Active Problem List    Diagnosis Date Noted   Protein-calorie malnutrition, severe 10/30/2020   Closed right hip fracture, initial encounter (HCC) 10/26/2020   Hip fracture (HCC) 10/26/2020   COVID-19 virus infection 02/16/2019   B12 deficiency anemia 02/16/2019   Syncope 02/09/2019   Pancytopenia (HCC) 02/09/2019   H/O cardiac pacemaker 08/30/2015   Thyroid disease 01/23/2014   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 Authorizing Provider  acetaminophen (TYLENOL) 500 MG tablet Take 500 mg by mouth every 6 (six) hours as needed for mild pain or fever.    [provider]  ALPRAZolam Prudy Feeler) 0.5 MG tablet Take 1 tablet (0.5 mg total) by mouth 2 (two) times daily as needed for anxiety. 11/05/20   Joseph Art, DO  busPIRone (BUSPAR) 5 MG tablet Take 7.5-10 mg by mouth See admin instructions. 10 mg in the morning and at bedtime. 7.5 mg in the afternoon    [provider]  Cholecalciferol (VITAMIN D3) 50 MCG (2000 UT) TABS Take 2,000 Units by mouth daily.    [provider]  diclofenac Sodium (VOLTAREN) 1 % GEL Apply 2 g topically in the morning and at bedtime. Right knee    [provider]  divalproex (DEPAKOTE SPRINKLE) 125 MG capsule Take 125 mg by mouth 2 (  two) times daily.    [provider]  Elastic Bandages & Supports (MEDICAL COMPRESSION STOCKINGS) MISC 1 application See admin instructions. Apply in the morning and remove at bedtime    [provider]  escitalopram (LEXAPRO) 10 MG tablet Take 10 mg by mouth daily.    [provider]  ferrous sulfate 325 (65 FE) MG tablet Take 325 mg by mouth daily with breakfast.    [provider]  levothyroxine (SYNTHROID, LEVOTHROID) 75 MCG tablet Take 75 mcg by mouth every morning.     [provider]  loperamide (IMODIUM A-D) 2 MG tablet Take 2-4 mg by mouth See admin instructions. 4 mg after first loose  stool, then 2 mg after each loose stool. Max 4 tabs in 24 hours    [provider]  magnesium oxide (MAG-OX) 400 MG tablet Take 400 mg by mouth daily.    [provider]  Melatonin 10 MG TABS Take 10 mg by mouth at bedtime.    [provider]  Pimavanserin Tartrate (NUPLAZID) 34 MG CAPS Take 34 mg by mouth daily.    [provider]  QUEtiapine (SEROQUEL) 100 MG tablet Take 100 mg by mouth at bedtime.    [provider]  QUEtiapine (SEROQUEL) 50 MG tablet Take 75 mg by mouth in the morning.    [provider]  vitamin B-12 (CYANOCOBALAMIN) 500 MCG tablet Take 500 mcg by mouth daily.    [provider]    Allergies Hydrocodone and Oxycodone    Social History Social History   Tobacco Use   Smoking status: Never   Smokeless tobacco: Never  Vaping Use   Vaping Use: Never used  Substance Use Topics   Alcohol use: No   Drug use: No    Review of Systems Patient denies headaches, rhinorrhea, blurry vision, numbness, shortness of breath, chest pain, edema, cough, abdominal pain, nausea, vomiting, diarrhea, dysuria, fevers, rashes or hallucinations unless otherwise stated above in HPI. ____________________________________________   PHYSICAL EXAM:  VITAL SIGNS: Vitals:   12/21/20 1133  BP: (!) 101/55  Pulse: 71  Resp: 20  Temp: 97.8 F (36.6 C)  SpO2: 94%    Constitutional: Alert, pleasant. Well appearing and in no acute distress. Eyes: Conjunctivae are normal.  Head: right occiptal contusion without laceration Nose: No congestion/rhinnorhea. Mouth/Throat: Mucous membranes are moist.   Neck: Painless ROM.  Cardiovascular:   Good peripheral circulation. Respiratory: Normal respiratory effort.  No retractions.  Gastrointestinal: Soft and nontender.  Musculoskeletal: No lower extremity tenderness .  No joint effusions. Neurologic:  Normal speech and language. No gross focal neurologic deficits are appreciated.   Skin:  Skin is warm, dry and intact. No rash noted. Psychiatric: Mood and affect are normal. Speech and behavior are normal.  ____________________________________________   LABS (all labs ordered are listed, but only abnormal results are displayed)  Results for orders placed or performed during the hospital encounter of 12/21/20 (from the past 24 hour(s))  Basic metabolic panel     Status: Abnormal   Collection Time: 12/21/20 11:42 AM  Result Value Ref Range   Sodium 141 135 - 145 mmol/L   Potassium 4.3 3.5 - 5.1 mmol/L   Chloride 104 98 - 111 mmol/L   CO2 33 (H) 22 - 32 mmol/L   Glucose, Bld 88 70 - 99 mg/dL   BUN 26 (H) 8 - 23 mg/dL   Creatinine, Ser 8.18 0.44 - 1.00 mg/dL   Calcium 9.2 8.9 - 56.3 mg/dL  GFR, Estimated >60 >60 mL/min   Anion gap 4 (L) 5 - 15  CBC     Status: Abnormal   Collection Time: 12/21/20 11:42 AM  Result Value Ref Range   WBC 3.1 (L) 4.0 - 10.5 K/uL   RBC 3.13 (L) 3.87 - 5.11 MIL/uL   Hemoglobin 9.3 (L) 12.0 - 15.0 g/dL   HCT 62.7 (L) 03.5 - 00.9 %   MCV 98.1 80.0 - 100.0 fL   MCH 29.7 26.0 - 34.0 pg   MCHC 30.3 30.0 - 36.0 g/dL   RDW 38.1 (H) 82.9 - 93.7 %   Platelets 206 150 - 400 K/uL   nRBC 0.0 0.0 - 0.2 %  Troponin I (High Sensitivity)     Status: None   Collection Time: 12/21/20 11:42 AM  Result Value Ref Range   Troponin I (High Sensitivity) 6 <18 ng/L   ____________________________________________  EKG My review and personal interpretation at Time: 11:41   Indication: fall  Rate: 70  Rhythm: a-s v-p Axis: left Other: paced rhythm ____________________________________________  RADIOLOGY  I personally reviewed all radiographic images ordered to evaluate for the above acute complaints and reviewed radiology reports and findings.  These findings were personally discussed with the patient.  Please see medical record for radiology report.  ____________________________________________   PROCEDURES  Procedure(s) performed:   Procedures    Critical Care performed: no ____________________________________________   INITIAL IMPRESSION / ASSESSMENT AND PLAN / ED COURSE  Pertinent labs & imaging results that were available during my care of the patient were reviewed by me and considered in my medical decision making (see chart for details).   DDX: sdh, iph, contusion, laceration  Memorie Yokoyama is a 80 y.o. who presents to the ED with presentation as described above.  Imaging reassuring and without evidence of acute intracranial abnormality.  Does have occipital hematoma and contusion overlying laceration.  A patient is acting at her baseline.  No signs or symptoms or history to suggest sepsis or underlying metabolic pathology.  Stable appropriate for outpatient follow-up.    The patient was evaluated in Emergency Department today for the symptoms described in the history of present illness. He/she was evaluated in the context of the global COVID-19 pandemic, which necessitated consideration that the patient might be at risk for infection with the SARS-CoV-2 virus that causes COVID-19. Institutional protocols and algorithms that pertain to the evaluation of patients at risk for COVID-19 are in a state of rapid change based on information released by regulatory bodies including the CDC and federal and state organizations. These policies and algorithms were followed during the patient's care in the ED.   ____________________________________________   FINAL CLINICAL IMPRESSION(S) / ED DIAGNOSES  Final diagnoses:  Fall, initial encounter  Contusion of scalp, initial encounter      NEW MEDICATIONS STARTED DURING THIS VISIT:  New Prescriptions   No medications on file     Note:  This document was prepared using Dragon voice recognition software and may include unintentional dictation errors.     Willy Eddy, MD 12/21/20 [phone]

## 2020-12-21 NOTE — ED Provider Notes (Signed)
Emergency Medicine Provider Triage Evaluation Note  Teresa Richard , a 80 y.o. female was evaluated in triage.  Patient is from [facility], with a PMH of dementia, hear block and hypothyroidism, Pt complains of mechanical fall. She has a history of falls and fell backwards today resulting in a unknown period of syncope. No subsequent N/V is reported. Patient is at baseline upon presentation.   Review of Systems  Positive: Scalp hematoma, LOC Negative: No chest pain, abdominal pain  Physical Exam  There were no vitals taken for this visit. Gen:   Awake, no distress  afebrile Resp:  Normal effort.  No wheeze, rales, rhonchi MSK:   Moves extremities without difficulty. No edema.  Other:  Posterior scalp hematoma noted, without bleeding.   Medical Decision Making  Medically screening exam initiated at 11:32 AM.  Appropriate orders placed.  Teresa Richard was informed that the remainder of the evaluation will be completed by another provider, this initial triage assessment does not replace that evaluation, and the importance of remaining in the ED until their evaluation is complete.  DDX: Scalp hematoma, SDH, urosepsis    Lissa Hoard, PA-C 12/21/20 1206    Sharman Cheek, MD 12/21/20 [phone]

## 2020-12-21 NOTE — ED Triage Notes (Signed)
Pt from [facility] fall backwards sitting into wheelchair per ems. Pt has hematoma noted on back of head, skin is intact. Old wound to right side of forehead noted. No other injuries noted. Pt is tearful, confused. Orientation is baseline per EMS.

## 2020-12-31 ENCOUNTER — Other Ambulatory Visit: Payer: Self-pay

## 2020-12-31 ENCOUNTER — Emergency Department
Admission: EM | Admit: 2020-12-31 | Discharge: 2020-12-31 | Disposition: A | Discharge status: Home / Self Care | Attending: Emergency Medicine | Admitting: Emergency Medicine

## 2020-12-31 DIAGNOSIS — S0083XA Contusion of other part of head, initial encounter: Secondary | ICD-10-CM | POA: Insufficient documentation

## 2020-12-31 DIAGNOSIS — E039 Hypothyroidism, unspecified: Secondary | ICD-10-CM | POA: Diagnosis not present

## 2020-12-31 DIAGNOSIS — Z8616 Personal history of COVID-19: Secondary | ICD-10-CM | POA: Diagnosis not present

## 2020-12-31 DIAGNOSIS — S0990XA Unspecified injury of head, initial encounter: Secondary | ICD-10-CM

## 2020-12-31 DIAGNOSIS — Z79899 Other long term (current) drug therapy: Secondary | ICD-10-CM | POA: Diagnosis not present

## 2020-12-31 DIAGNOSIS — Z950 Presence of cardiac pacemaker: Secondary | ICD-10-CM | POA: Insufficient documentation

## 2020-12-31 DIAGNOSIS — R040 Epistaxis: Secondary | ICD-10-CM | POA: Diagnosis not present

## 2020-12-31 DIAGNOSIS — Z96641 Presence of right artificial hip joint: Secondary | ICD-10-CM | POA: Diagnosis not present

## 2020-12-31 DIAGNOSIS — W01198A Fall on same level from slipping, tripping and stumbling with subsequent striking against other object, initial encounter: Secondary | ICD-10-CM | POA: Insufficient documentation

## 2020-12-31 DIAGNOSIS — S40012A Contusion of left shoulder, initial encounter: Secondary | ICD-10-CM | POA: Diagnosis not present

## 2020-12-31 DIAGNOSIS — F0390 Unspecified dementia without behavioral disturbance: Secondary | ICD-10-CM | POA: Insufficient documentation

## 2020-12-31 NOTE — Discharge Instructions (Addendum)
Please follow-up closely with your hospice team and primary care provider.  You may return to the ER at anytime for further evaluation.  No lab work or CT scans were done today at the request of your healthcare power of attorney in consideration of your goals of care.

## 2020-12-31 NOTE — ED Provider Notes (Signed)
[facility] Emergency Department Provider Note   ____________________________________________   Event Date/Time   First MD Initiated Contact with Patient 12/31/20 1947     (approximate)  I have reviewed the triage vital signs and the nursing notes.   HISTORY  Chief Complaint Fall  EM caveat severe dementia  HPI Teresa Richard is a 80 y.o. female with a history of severe dementia multiple falls and recent memory care placement.  Also now on hospice care  The patient's daughter Teresa Richard who identifies herself as patient's guardian as well as healthcare power of attorney reports that they did not she does not wish for the patient undergo any testing or further evaluation but rather to return her to memory care  The patient had a fall, but she reports by time memory care called her that they had already loaded her with EMS to come to the hospital otherwise she would have told him not to transport.  The patient did fall strike her head and had a nosebleed and daughter reports they note that she does have a small amount of bruising over the middle of the nose, but of note the patient also has notable bruises in multiple stages over her face cheekbones left shoulder etc., these all are from preceding falls  The patient herself denies any pain or symptoms.  Has no acute concerns denies chest pain trouble breathing headache or other issues but again has severe dementia  Daughter reports that the patient is now under hospice care, has a DO NOT RESUSCITATE, and she does not wish for her mother to undergo any sort of testing including even lab work as the patient's goals of care at this point are to remain in hospice care in memory care.  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    Patient Active Problem List   Diagnosis Date Noted   Protein-calorie malnutrition, severe 10/30/2020   Closed right hip fracture, initial encounter (HCC) 10/26/2020   Hip fracture (HCC) 10/26/2020   COVID-19 virus infection 02/16/2019   B12 deficiency anemia 02/16/2019   Syncope 02/09/2019   Pancytopenia (HCC) 02/09/2019   H/O cardiac pacemaker 08/30/2015   Thyroid disease 01/23/2014   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    Past Surgical History:  Procedure Laterality Date   ANTERIOR APPROACH HEMI HIP ARTHROPLASTY Right 10/31/2020   Procedure: HEMIARTHROPLASTY;  Surgeon: Samson Frederic, MD;  Location: [facility] OR;  Service: Orthopedics;  Laterality: Right;   BREAST CYST EXCISION Left 1980's   negative   CATARACT EXTRACTION W/PHACO Left 11/11/2016   Procedure: CATARACT EXTRACTION PHACO AND INTRAOCULAR LENS PLACEMENT (IOC);  Surgeon: Galen Manila, MD;  Location: [facility] ORS;  Service: Ophthalmology;  Laterality: Left;  [place]  00:55 AP% 14.7 CDE 8.15 Fluid pack lot # [id number]   CTR Right    EYE SURGERY Bilateral    cataract extractions   FRACTURE SURGERY Right 2005   LEG. nuts and bolts in knee and foot   IMPLANTABLE CARDIOVERTER DEFIBRILLATOR (ICD) GENERATOR CHANGE Left 07/07/2018   Procedure: PACER CHANGEOUT;  Surgeon: Marcina Millard, MD;  Location: [facility] ORS;  Service: Cardiovascular;  Laterality: Left;  INSERT / REPLACE / REMOVE PACEMAKER     PACEMAKER INSERTION     RCR Left 2008    Prior to Admission medications   Medication Sig Start Date End Date Taking? Authorizing Provider  acetaminophen (TYLENOL) 500 MG tablet Take 500 mg by mouth every 6 (six) hours as needed for mild pain or fever.    [provider]  ALPRAZolam Prudy Feeler(XANAX) 0.5 MG tablet Take 1 tablet (0.5 mg total) by mouth 2 (two) times daily as needed for anxiety. 11/05/20   Joseph ArtVann, Jessica U, DO  busPIRone (BUSPAR) 5 MG tablet Take 7.5-10  mg by mouth See admin instructions. 10 mg in the morning and at bedtime. 7.5 mg in the afternoon    [provider]  Cholecalciferol (VITAMIN D3) 50 MCG (2000 UT) TABS Take 2,000 Units by mouth daily.    [provider]  diclofenac Sodium (VOLTAREN) 1 % GEL Apply 2 g topically in the morning and at bedtime. Right knee    [provider]  divalproex (DEPAKOTE SPRINKLE) 125 MG capsule Take 125 mg by mouth 2 (two) times daily.    [provider]  Elastic Bandages & Supports (MEDICAL COMPRESSION STOCKINGS) MISC 1 application See admin instructions. Apply in the morning and remove at bedtime    [provider]  escitalopram (LEXAPRO) 10 MG tablet Take 10 mg by mouth daily.    [provider]  ferrous sulfate 325 (65 FE) MG tablet Take 325 mg by mouth daily with breakfast.    [provider]  levothyroxine (SYNTHROID, LEVOTHROID) 75 MCG tablet Take 75 mcg by mouth every morning.     [provider]  loperamide (IMODIUM A-D) 2 MG tablet Take 2-4 mg by mouth See admin instructions. 4 mg after first loose stool, then 2 mg after each loose stool. Max 4 tabs in 24 hours    [provider]  magnesium oxide (MAG-OX) 400 MG tablet Take 400 mg by mouth daily.    [provider]  Melatonin 10 MG TABS Take 10 mg by mouth at bedtime.    [provider]  Pimavanserin Tartrate (NUPLAZID) 34 MG CAPS Take 34 mg by mouth daily.    [provider]  QUEtiapine (SEROQUEL) 100 MG tablet Take 100 mg by mouth at bedtime.    [provider]  QUEtiapine (SEROQUEL) 50 MG tablet Take 75 mg by mouth in the morning.    [provider]  vitamin B-12 (CYANOCOBALAMIN) 500 MCG tablet Take 500 mcg by mouth daily.    [provider]    Allergies Hydrocodone and Oxycodone  Family History  Problem Relation Age of Onset   Breast cancer Neg Hx     Social History Social History   Tobacco Use    Smoking status: Never   Smokeless tobacco: Never  Vaping Use   Vaping Use: Never used  Substance Use Topics   Alcohol use: No   Drug use: No    Review of Systems  EM caveat   ____________________________________________   PHYSICAL EXAM:  VITAL SIGNS: ED Triage Vitals  Enc Vitals Group     BP 12/31/20 1945 (!) 122/58     Pulse Rate 12/31/20 1945 67     Resp 12/31/20 1945 16     Temp 12/31/20 1945 98.5 F (36.9 C)     Temp src --      SpO2 12/31/20 1945 100 %     Weight 12/31/20 2005 110 lb 3.7 oz (50 kg)  Height 12/31/20 2005 5\' 4"  (1.626 m)     Head Circumference --      Peak Flow --      Pain Score 12/31/20 2034 0     Pain Loc --      Pain Edu? --      Excl. in GC? --     Constitutional: Alert and oriented to her name but not to place or year.  Pleasantly confused.  Does not exhibit any picking or abnormal body movements.  She does not appear overtly delirious Eyes: Conjunctivae are normal. Head: Atraumatic except for bruises that appear to be old overlying her forehead bilaterally.  She does have evidence of previous epistaxis with dried blood in the nares bilaterally but no active bleeding or lacerations.  She may have a very small superficial hematoma over the left frontal bone, difficult ascertain if this is truly new or from previous injuries.  No bleeding from the scalp or hairline. Nose: No congestion/rhinnorhea.  Denies neck pain to exam of the cervical spine Mouth/Throat: Mucous membranes are moist. Neck: No stridor.  Cardiovascular: Normal rate, regular rhythm. Grossly normal heart sounds.  Good peripheral circulation. Respiratory: Normal respiratory effort.  No retractions. Lungs CTAB. Gastrointestinal: Soft and nontender. No distention. Musculoskeletal: No lower extremity tenderness nor edema.  Patient follows commands able to range her extremities without difficulty pain or distress.  Unable to range the hips knees ankles elbows shoulders without pain  or discomfort.  Of note she does have a implanted pacemaker in the left upper chest clean dry intact old scar.  In addition she has a small ecchymosis over the left anterior shoulder without deformity or pain. Neurologic:  Normal speech and language when she does speak but only speaks 1 or 2 words and is rather soft-spoken. No gross focal neurologic deficits are appreciated.  Skin:  Skin is warm, dry. Psychiatric: Mood and affect are flat but shows no psychomotor agitation  ____________________________________________   LABS (all labs ordered are listed, but only abnormal results are displayed)  Labs Reviewed - No data to display ____________________________________________  EKG   ____________________________________________  RADIOLOGY   ____________________________________________   PROCEDURES  Procedure(s) performed: None  Procedures  Critical Care performed: No  ____________________________________________   INITIAL IMPRESSION / ASSESSMENT AND PLAN / ED COURSE  Pertinent labs & imaging results that were available during my care of the patient were reviewed by me and considered in my medical decision making (see chart for details).    Goals of care very important to this patient.  Daughter at the bedside reports she is at her baseline.  No evidence of septal hematoma or obvious nasal bone fracture though some dried blood is noted in the nares bilaterally.  Patient without any distress or discomfort normal vital signs.  Goals of care and the patient's healthcare power of attorney advising no further work-up which I think is quite reasonable given their stated goals of care.  Discussed with the daughter and she understands that certainly there could be internal injuries such as fractures, bleeding, hemorrhage, nasal bone fracture etc. that could certainly be missed if we do not obtain imaging but patient's goals of care are that that they do not wish to obtain any further CT  scanning or x-rays at this point.  Lab work declined by family again stating goals of care.  She is at her current baseline with normal vital signs, and at this point given her goals of care patient will be returned to  the care of her daughter for discharge and return back to her memory care facility.  Daughter advised will be following up closely with the hospice team as well  Clinical Course as of 12/31/20 2037  [facility] Dec 31, 2020  1956 Phone calls to all listed numbers and attempt to reach Teresa Richard patient is listed emergency contact.  No return at this time, HIPAA compliant message to return call left [MQ]    Clinical Course User Index [MQ] Sharyn Creamer, MD     ____________________________________________   FINAL CLINICAL IMPRESSION(S) / ED DIAGNOSES  Final diagnoses:  Nosebleed  Injury of head, initial encounter        Note:  This document was prepared using Dragon voice recognition software and may include unintentional dictation errors       Sharyn Creamer, MD 12/31/20 2042

## 2020-12-31 NOTE — ED Triage Notes (Signed)
EMS reports being called to [facility] for Fall, multiple facial lacerations and bloody nose, DNR, family called to request NO scans be conducted patient is on Hospice

## 2020-12-31 NOTE — ED Notes (Signed)
MD Sharyn Creamer notified: Daughter at bedside, refusing blood collection at this time

## 2021-01-13 ENCOUNTER — Emergency Department
Admission: EM | Admit: 2021-01-13 | Discharge: 2021-01-13 | Disposition: A | Discharge status: Home / Self Care | Attending: Emergency Medicine | Admitting: Emergency Medicine

## 2021-01-13 ENCOUNTER — Emergency Department

## 2021-01-13 ENCOUNTER — Other Ambulatory Visit: Payer: Self-pay

## 2021-01-13 DIAGNOSIS — S0003XA Contusion of scalp, initial encounter: Secondary | ICD-10-CM | POA: Insufficient documentation

## 2021-01-13 DIAGNOSIS — E039 Hypothyroidism, unspecified: Secondary | ICD-10-CM | POA: Diagnosis not present

## 2021-01-13 DIAGNOSIS — W050XXA Fall from non-moving wheelchair, initial encounter: Secondary | ICD-10-CM | POA: Diagnosis not present

## 2021-01-13 DIAGNOSIS — F03C0 Unspecified dementia, severe, without behavioral disturbance, psychotic disturbance, mood disturbance, and anxiety: Secondary | ICD-10-CM

## 2021-01-13 DIAGNOSIS — Z950 Presence of cardiac pacemaker: Secondary | ICD-10-CM | POA: Diagnosis not present

## 2021-01-13 DIAGNOSIS — Z23 Encounter for immunization: Secondary | ICD-10-CM | POA: Diagnosis not present

## 2021-01-13 DIAGNOSIS — S8002XA Contusion of left knee, initial encounter: Secondary | ICD-10-CM | POA: Diagnosis not present

## 2021-01-13 DIAGNOSIS — F0390 Unspecified dementia without behavioral disturbance: Secondary | ICD-10-CM | POA: Diagnosis not present

## 2021-01-13 DIAGNOSIS — Z79899 Other long term (current) drug therapy: Secondary | ICD-10-CM | POA: Insufficient documentation

## 2021-01-13 DIAGNOSIS — S0990XA Unspecified injury of head, initial encounter: Secondary | ICD-10-CM | POA: Diagnosis present

## 2021-01-13 DIAGNOSIS — Z8616 Personal history of COVID-19: Secondary | ICD-10-CM | POA: Diagnosis not present

## 2021-01-13 MED ORDER — QUETIAPINE FUMARATE 25 MG PO TABS
100.0000 mg | ORAL_TABLET | ORAL | Status: AC
  Administered 2021-01-13: 100 mg via ORAL
  Filled 2021-01-13: qty 4

## 2021-01-13 MED ORDER — LORAZEPAM 1 MG PO TABS
1.0000 mg | ORAL_TABLET | ORAL | Status: AC
  Administered 2021-01-13: 1 mg via ORAL
  Filled 2021-01-13: qty 1

## 2021-01-13 MED ORDER — TETANUS-DIPHTH-ACELL PERTUSSIS 5-2.5-18.5 LF-MCG/0.5 IM SUSY
0.5000 mL | PREFILLED_SYRINGE | Freq: Once | INTRAMUSCULAR | Status: AC
  Administered 2021-01-13: 0.5 mL via INTRAMUSCULAR
  Filled 2021-01-13: qty 0.5

## 2021-01-13 MED ORDER — DIVALPROEX SODIUM 125 MG PO CSDR
125.0000 mg | DELAYED_RELEASE_CAPSULE | ORAL | Status: AC
  Administered 2021-01-13: 125 mg via ORAL
  Filled 2021-01-13: qty 1

## 2021-01-13 MED ORDER — BUSPIRONE HCL 5 MG PO TABS
10.0000 mg | ORAL_TABLET | ORAL | Status: AC
  Administered 2021-01-13: 10 mg via ORAL
  Filled 2021-01-13: qty 2

## 2021-01-13 NOTE — ED Notes (Signed)
Wound on head cleansed w water and dressing applied. Nose cleansed w water and tegaderm applied. Chin cleansed with water and bandaid applied.

## 2021-01-13 NOTE — ED Provider Notes (Signed)
[facility] Emergency Department Provider Note  ____________________________________________  Time seen: Approximately 7:56 PM  I have reviewed the triage vital signs and the nursing notes.   HISTORY  Chief Complaint Fall    Level 5 Caveat: Portions of the History and Physical including HPI and review of systems are unable to be completely obtained due to patient advanced dementia HPI Teresa Richard is a 80 y.o. female with a history of dementia, hypothyroidism who is brought to the ED due to fall out of her wheelchair hitting her forehead on the ground.  Also has bruising to the left knee.  Patient is not able to provide any meaningful history.  Daughter and hospice nurse advised that the patient should not undergo CT scanning of the head due to her advanced directives and the decision that she should not undergo any potential surgeries or life-sustaining procedures    Past Medical History:  Diagnosis Date  . 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 Anxiety    due to memory loss  . Dementia (HCC)   . Depression   . Dysrhythmia   . Hypothyroidism   . 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 Protein-calorie malnutrition, severe 10/30/2020  . Closed right hip fracture, initial encounter (HCC) 10/26/2020  . Hip fracture (HCC) 10/26/2020  . COVID-19 virus infection 02/16/2019  . B12 deficiency anemia 02/16/2019  . Syncope 02/09/2019  . Pancytopenia (HCC) 02/09/2019  . 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 Thyroid disease 01/23/2014  . 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 ANTERIOR APPROACH HEMI HIP ARTHROPLASTY Right 10/31/2020   Procedure: HEMIARTHROPLASTY;  Surgeon: Samson Frederic, MD;  Location: [facility] OR;  Service: Orthopedics;  Laterality: Right;  . BREAST CYST EXCISION Left 1980's   negative  . CATARACT  EXTRACTION W/PHACO Left 11/11/2016   Procedure: CATARACT EXTRACTION PHACO AND INTRAOCULAR LENS PLACEMENT (IOC);  Surgeon: Galen Manila, MD;  Location: [facility] ORS;  Service: Ophthalmology;  Laterality: Left;  [place]  00:55 AP% 14.7 CDE 8.15 Fluid pack lot # [id number]  . CTR Right   . EYE SURGERY Bilateral    cataract extractions  . FRACTURE SURGERY Right 2005   LEG. nuts and bolts in knee and foot  . IMPLANTABLE CARDIOVERTER DEFIBRILLATOR (ICD) GENERATOR CHANGE Left 07/07/2018   Procedure: PACER CHANGEOUT;  Surgeon: Marcina Millard, MD;  Location: [facility] ORS;  Service: Cardiovascular;  Laterality: Left;  . INSERT / REPLACE / REMOVE PACEMAKER    . PACEMAKER INSERTION    . RCR Left 2008     Prior to Admission medications   Medication Sig Start Date End Date Taking? Authorizing Provider  acetaminophen (TYLENOL) 500 MG tablet Take 500 mg by mouth every 6 (six) hours as needed for mild pain or fever.    [provider]  ALPRAZolam Prudy Feeler) 0.5 MG tablet Take 1 tablet (0.5 mg total) by mouth 2 (two) times daily as needed for anxiety. 11/05/20   Joseph Art, DO  busPIRone (BUSPAR) 5 MG tablet Take 7.5-10 mg by mouth See admin instructions. 10 mg in the morning and at bedtime. 7.5 mg in the afternoon    [provider]  Cholecalciferol (VITAMIN D3) 50 MCG (2000 UT) TABS Take 2,000 Units by mouth daily.    [provider]  diclofenac Sodium (VOLTAREN) 1 %  GEL Apply 2 g topically in the morning and at bedtime. Right knee    [provider]  divalproex (DEPAKOTE SPRINKLE) 125 MG capsule Take 125 mg by mouth 2 (two) times daily.    [provider]  Elastic Bandages & Supports (MEDICAL COMPRESSION STOCKINGS) MISC 1 application See admin instructions. Apply in the morning and remove at bedtime    [provider]  escitalopram (LEXAPRO) 10 MG tablet Take 10 mg by mouth daily.    [provider]  ferrous sulfate 325 (65 FE) MG tablet Take 325  mg by mouth daily with breakfast.    [provider]  levothyroxine (SYNTHROID, LEVOTHROID) 75 MCG tablet Take 75 mcg by mouth every morning.     [provider]  loperamide (IMODIUM A-D) 2 MG tablet Take 2-4 mg by mouth See admin instructions. 4 mg after first loose stool, then 2 mg after each loose stool. Max 4 tabs in 24 hours    [provider]  magnesium oxide (MAG-OX) 400 MG tablet Take 400 mg by mouth daily.    [provider]  Melatonin 10 MG TABS Take 10 mg by mouth at bedtime.    [provider]  Pimavanserin Tartrate (NUPLAZID) 34 MG CAPS Take 34 mg by mouth daily.    [provider]  QUEtiapine (SEROQUEL) 100 MG tablet Take 100 mg by mouth at bedtime.    [provider]  QUEtiapine (SEROQUEL) 50 MG tablet Take 75 mg by mouth in the morning.    [provider]  vitamin B-12 (CYANOCOBALAMIN) 500 MCG tablet Take 500 mcg by mouth daily.    [provider]     Allergies Hydrocodone and Oxycodone   Family History  Problem Relation Age of Onset  . Breast cancer Neg Hx     Social History Social History   Tobacco Use  . Smoking status: Never  . Smokeless tobacco: Never  Vaping Use  . Vaping Use: Never used  Substance Use Topics  . Alcohol use: No  . Drug use: No    Review of Systems Level 5 Caveat: Portions of the History and Physical including HPI and review of systems are unable to be completely obtained due to patient being a poor historian   Constitutional:   No known fever.  ENT:   No rhinorrhea. Cardiovascular:   No chest pain or syncope. Respiratory:   No dyspnea or cough. Gastrointestinal:   Negative for abdominal pain, vomiting and diarrhea.  Musculoskeletal:   Left knee swelling ____________________________________________   PHYSICAL EXAM:  VITAL SIGNS: ED Triage Vitals  Enc Vitals Group     BP 01/13/21 1917 (!) 151/61     Pulse Rate 01/13/21 1917 61     Resp 01/13/21 1917  16     Temp 01/13/21 1917 98.2 F (36.8 C)     Temp Source 01/13/21 1917 Oral     SpO2 01/13/21 1913 98 %     Weight 01/13/21 1920 132 lb 4.4 oz (60 kg)     Height 01/13/21 1920 5\' 4"  (1.626 m)     Head Circumference --      Peak Flow --      Pain Score 01/13/21 1918 5     Pain Loc --      Pain Edu? --      Excl. in GC? --     Vital signs reviewed, nursing assessments reviewed.   Constitutional:   Awake.  Not alert.  Not oriented.  Nontoxic appearance.  Appears underweight Eyes:   Conjunctivae are normal. EOMI. PERRL. ENT      Head:   Normocephalic with sizable scalp hematoma over the left forehead.  There is overlying skin tear but dermis is intact.  Bleeding is controlled..      Nose:   Dried blood at the nares.  There is swelling and tenderness at the nasal bridge.  No maxillary depression or point tenderness or crepitus.      Mouth/Throat:   MMM, no pharyngeal erythema. No peritonsillar mass.       Neck:   No meningismus. Full ROM.  No midline spinal tenderness Hematological/Lymphatic/Immunilogical:   No cervical lymphadenopathy. Cardiovascular:   RRR. Symmetric bilateral radial and DP pulses.  No murmurs. Cap refill less than 2 seconds. Respiratory:   Normal respiratory effort without tachypnea/retractions. Breath sounds are clear and equal bilaterally. No wheezes/rales/rhonchi. Gastrointestinal:   Soft and nontender. Non distended. There is no CVA tenderness.  No rebound, rigidity, or guarding. Genitourinary:   deferred Musculoskeletal:   Normal range of motion in all extremities. No joint effusions.  There is mild tenderness at the left knee, no bony point tenderness or deformity, no open wounds.  Knee joint is stable.  No edema. Neurologic:   Incoherent speech consistent with known dementia Motor grossly intact. No acute focal neurologic deficits are appreciated.  Skin:    Skin is warm, dry with facial skin tears. No rash noted.  No petechiae, purpura, or  bullae.  ____________________________________________    LABS (pertinent positives/negatives) (all labs ordered are listed, but only abnormal results are displayed) Labs Reviewed - No data to display ____________________________________________   EKG    ____________________________________________    RADIOLOGY  No results found.  ____________________________________________   PROCEDURES Procedures  ____________________________________________    CLINICAL IMPRESSION / ASSESSMENT AND PLAN / ED COURSE  Medications ordered in the ED: Medications  Tdap (BOOSTRIX) injection 0.5 mL (has no administration in time range)    Pertinent labs & imaging results that were available during my care of the patient were reviewed by me and considered in my medical decision making (see chart for details).   Norva PavlovLinda Pavek was evaluated in Emergency Department on 01/13/2021 for the symptoms described in the history of present illness. She was evaluated in the context of the global COVID-19 pandemic, which necessitated consideration that the patient might be at risk for infection with the SARS-CoV-2 virus that causes COVID-19. Institutional protocols and algorithms that pertain to the evaluation of patients at risk for COVID-19 are in a state of rapid change based on information released by regulatory bodies including the CDC and federal and state organizations. These policies and algorithms were followed during the patient's care in the ED.   Patient sent to ED for evaluation after fall with head trauma.  We will x-ray the left knee.  Per her advanced directives and healthcare power of attorney, patient should not undergo CT imaging of her head neck and face as she would not be considered for surgery or medical procedures at this point.  Will update tetanus.  Clinical Course as of 01/13/21 2044  Wynelle LinkSun Jan 13, 2021  2033 X-ray unremarkable, no fracture in the knee.  We will give the patient's  nighttime medications and arrange transport back to her facility. [PS]    Clinical Course User Index [PS] Sharman CheekStafford, Yassin Scales, MD     ____________________________________________   FINAL CLINICAL IMPRESSION(S) / ED DIAGNOSES    Final diagnoses:  Hematoma of  scalp, initial encounter  Advanced dementia [facility])     ED Discharge Orders     None       Portions of this note were generated with dragon dictation software. Dictation errors may occur despite best attempts at proofreading.   Sharman Cheek, MD 01/13/21 [phone]

## 2021-01-13 NOTE — ED Triage Notes (Signed)
Pt arrived via ems for a fall out of wheel chair. Large hematoma over left eye. Bruising over left knee. Pt alert to name only, per ems this is baseline.

## 2021-01-13 NOTE — ED Notes (Signed)
Request made for [facility] to transport to [facility]

## 2021-01-13 NOTE — Discharge Instructions (Addendum)
Your knee x-ray does not show any fracture.  We gave a tetanus vaccine, as well as your nighttime dose of Seroquel, Ativan, Depakote, and buspirone.  Continue your usual medications scheduled tomorrow.

## 2021-01-13 NOTE — ED Notes (Signed)
Report called to [employer]

## 2023-03-19 IMAGING — CT CT HEAD W/O CM
4 series · 16 of 47 positions shown, 18 images · non-contrast
Comparison: 10/10/2020

CLINICAL DATA: Minor head trauma

EXAM:
CT HEAD WITHOUT CONTRAST
TECHNIQUE: Contiguous axial images were obtained from the base of the skull
through the vertex without intravenous contrast.

[Series 2: head wo · axial · 0.43mm/px · z∈[-78,+42]mm · 7 of 33 slices shown, 9 images]
[im 5/33  brain]
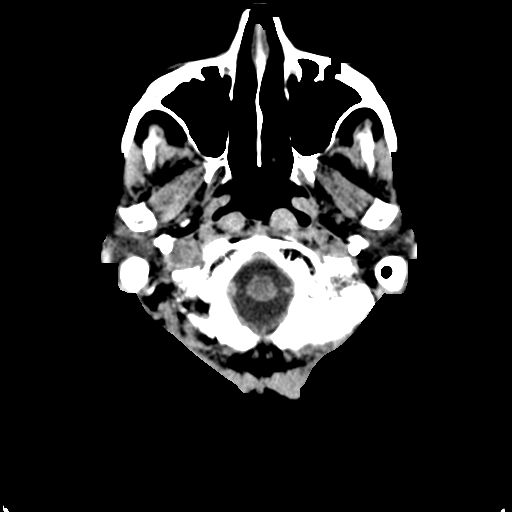
[im 5/33  bone]
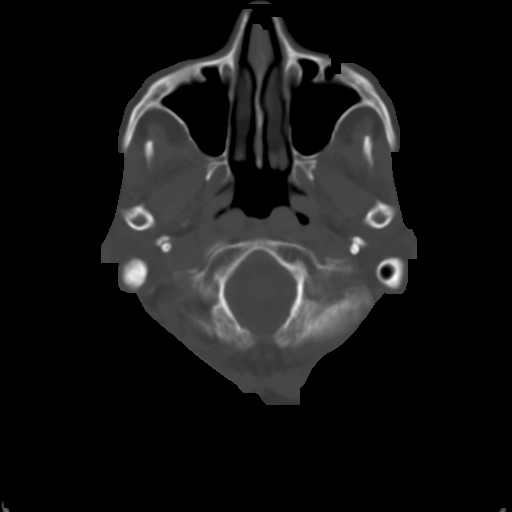
[im 9/33  brain]
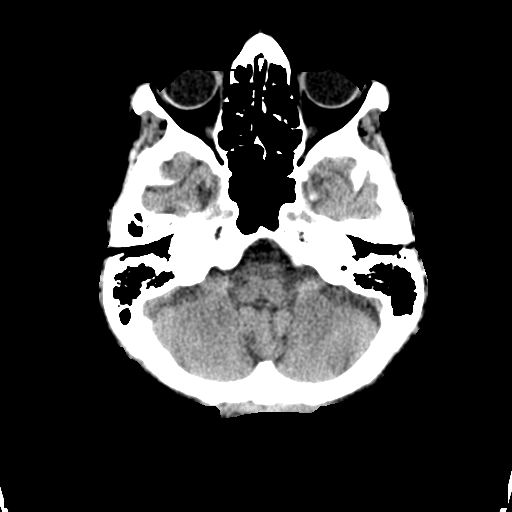
[im 13/33  brain]
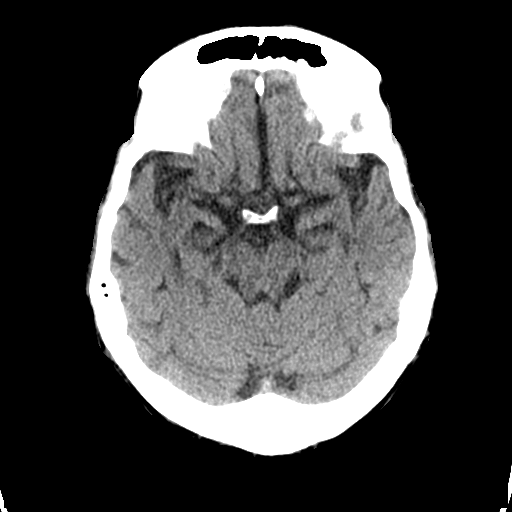
[im 17/33  brain]
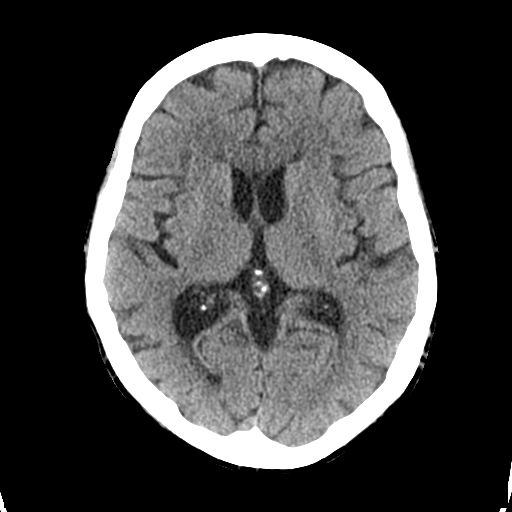
[im 21/33  brain]
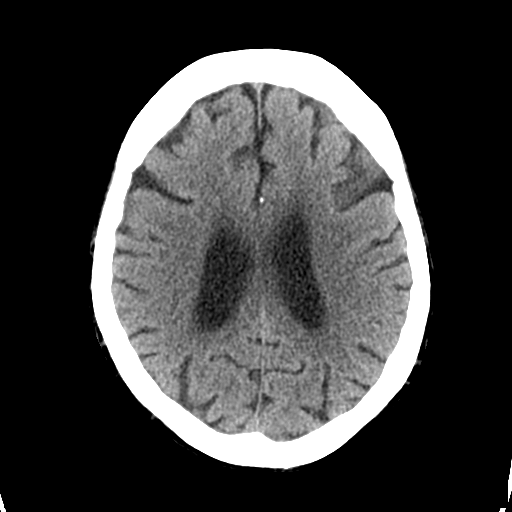
[im 21/33  bone]
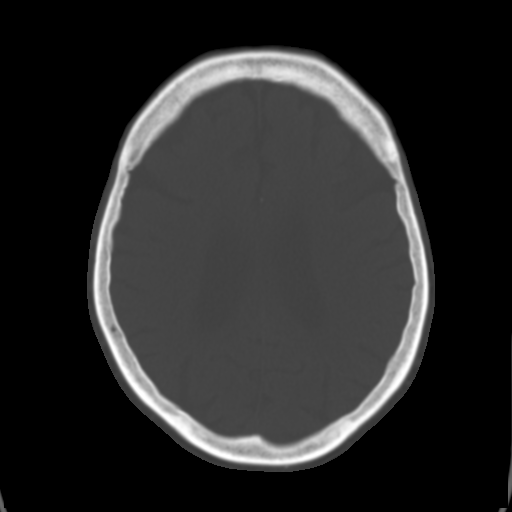
[im 25/33  brain]
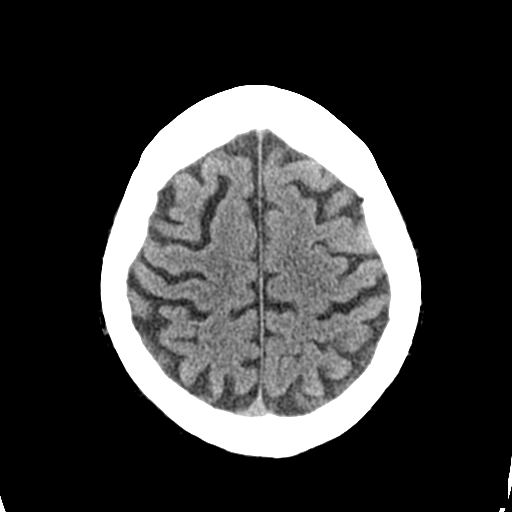
[im 29/33  brain]
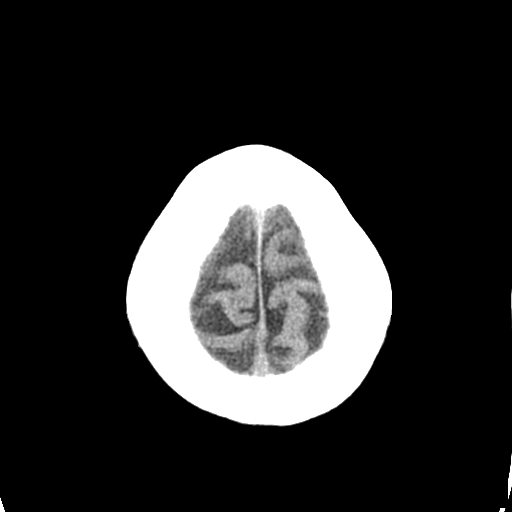

[Series 3: head bone · axial · 0.43mm/px · z∈[-82,-50]mm · 3 of 83 slices shown]
[im 9/83  bone]
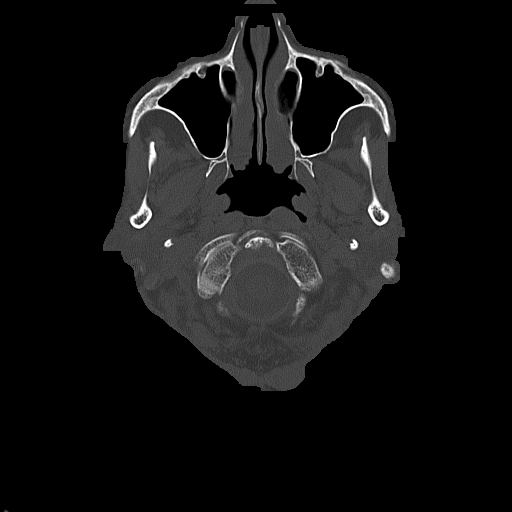
[im 17/83  bone]
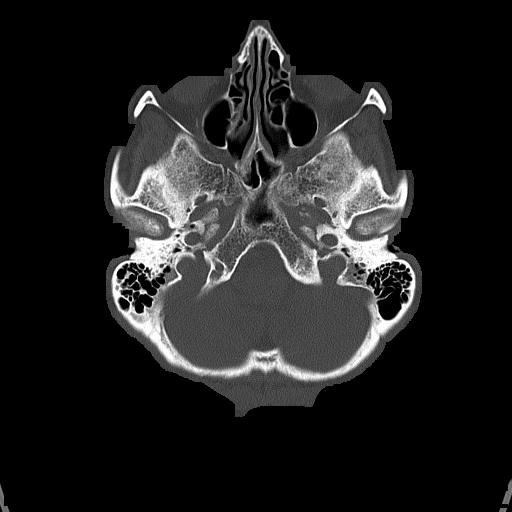
[im 25/83  bone]
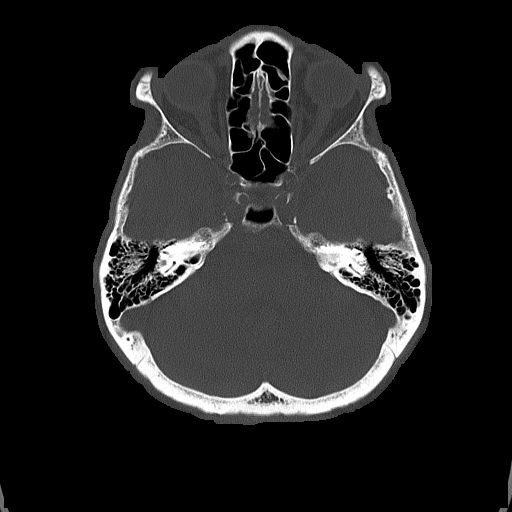

[Series 4: coronal soft tissue · coronal · 0.33mm/px · 3 of 76 slices shown]
[im 26/76  brain]
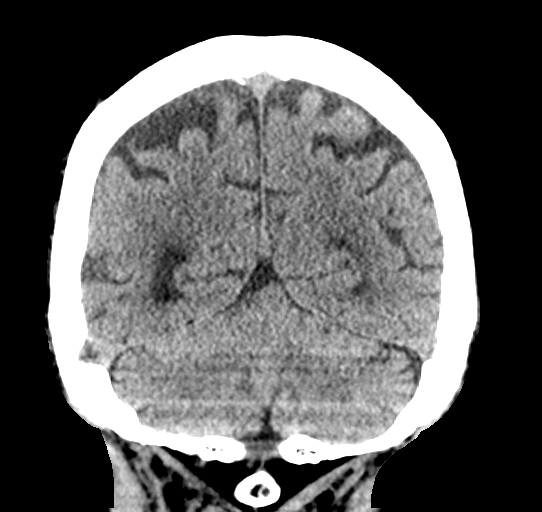
[im 34/76  brain]
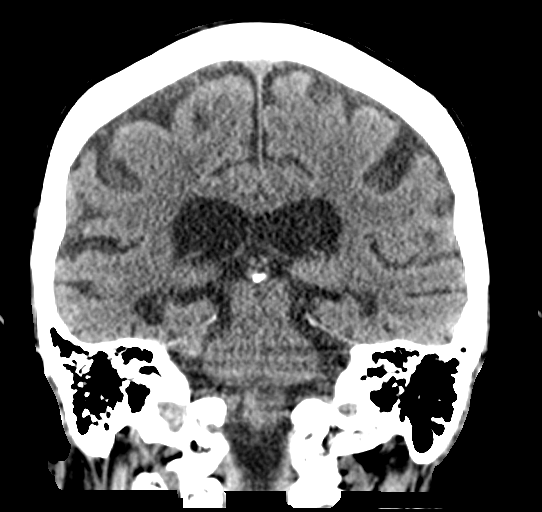
[im 42/76  brain]
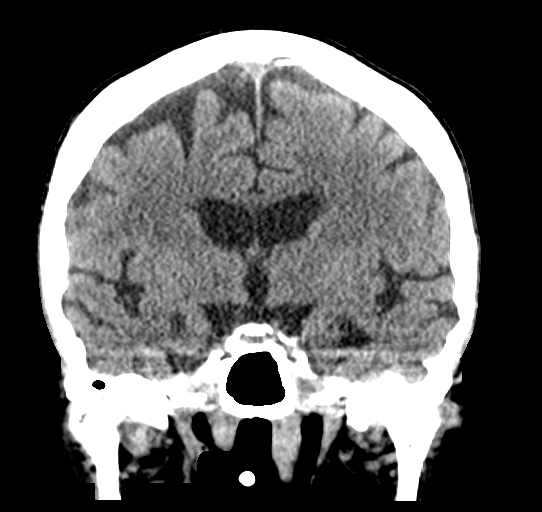

[Series 5: sagittal soft tissue · sagittal · 0.33mm/px · 3 of 60 slices shown]
[im 20/60  brain]
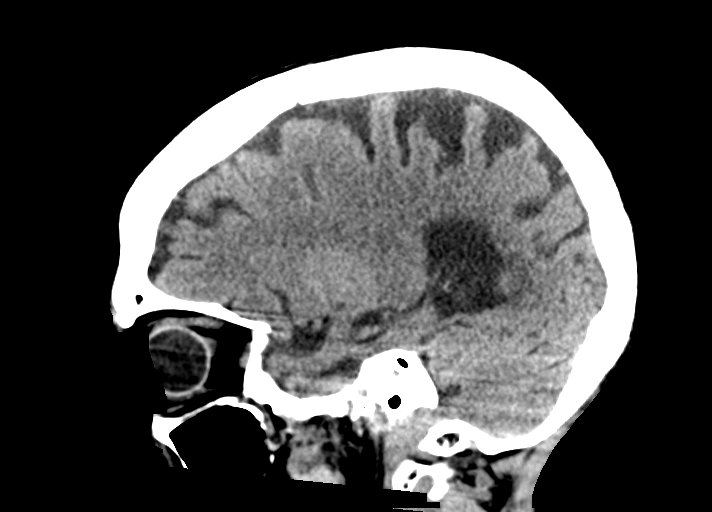
[im 30/60  brain]
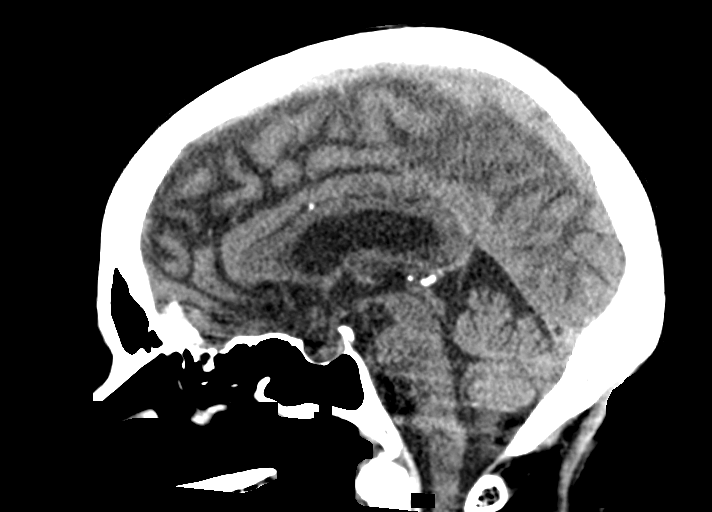
[im 40/60  brain]
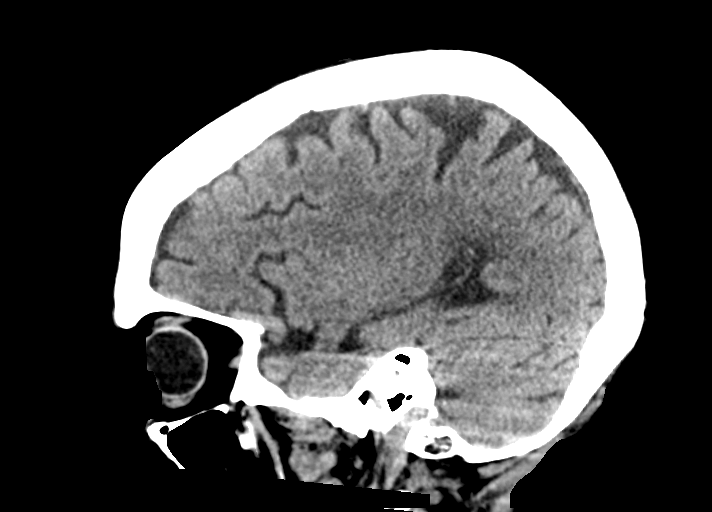

[16 of 47 positions shown; findings below may reference images not displayed]

FINDINGS: Brain: No evidence of acute infarction, hemorrhage, hydrocephalus,
extra-axial collection or mass lesion/mass effect.

Vascular: No hyperdense vessel or unexpected calcification.

Skull: Normal. Negative for fracture or focal lesion.

Sinuses/Orbits: No acute finding.
IMPRESSION: No evidence of intracranial injury.

## 2023-03-31 IMAGING — DX DG HIP (WITH OR WITHOUT PELVIS) 2-3V*R*
3 series · 3 of 3 positions shown · non-contrast
Comparison: Hip radiograph 10/10/2020

CLINICAL DATA: Post fall with recent hip fracture on outpatient
imaging.

EXAM:
DG HIP (WITH OR WITHOUT PELVIS) 2-3V RIGHT

[pelvis ap]
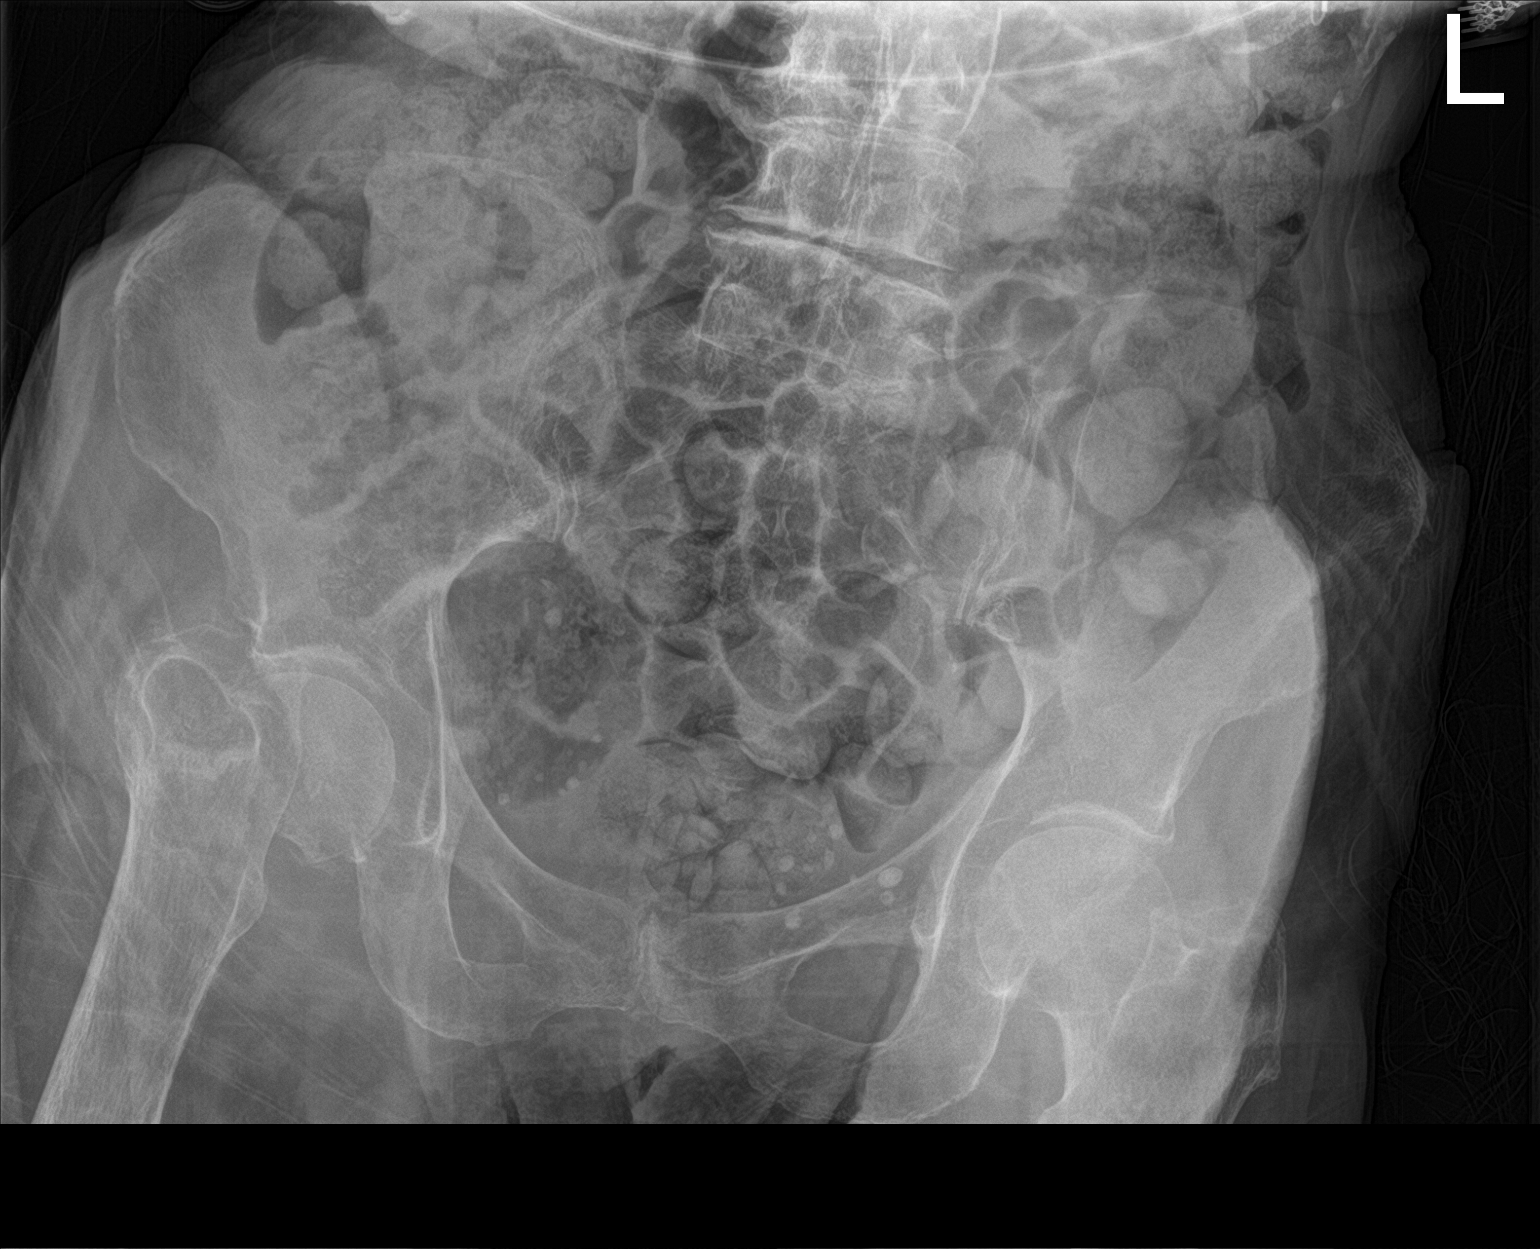

[hip lat]
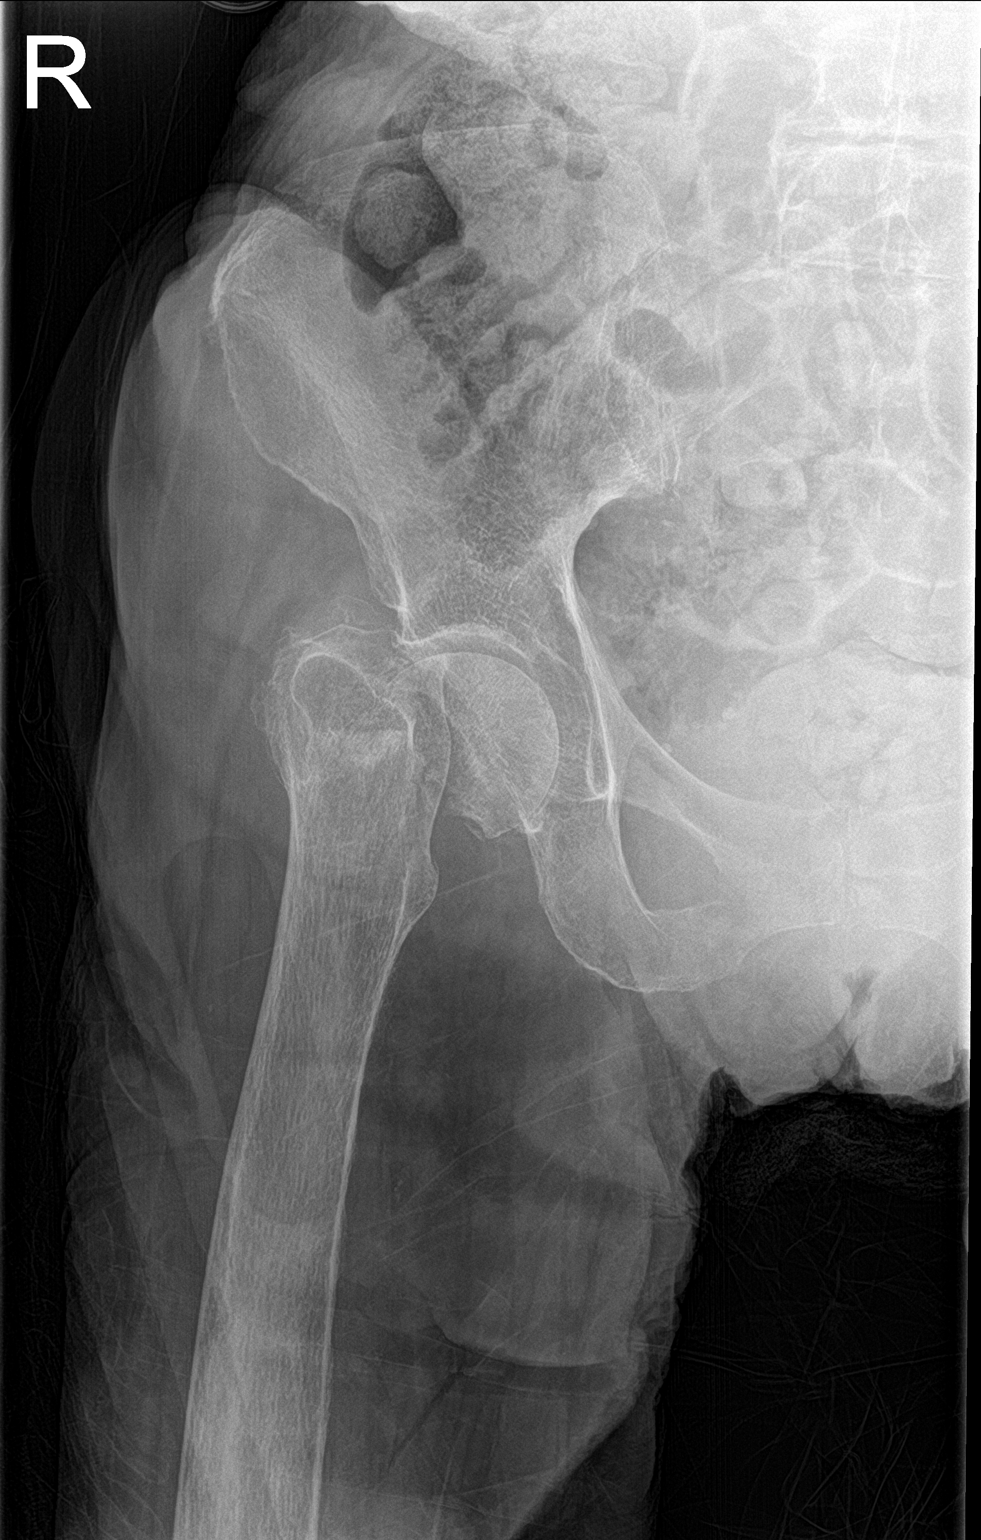

[hip x-table]
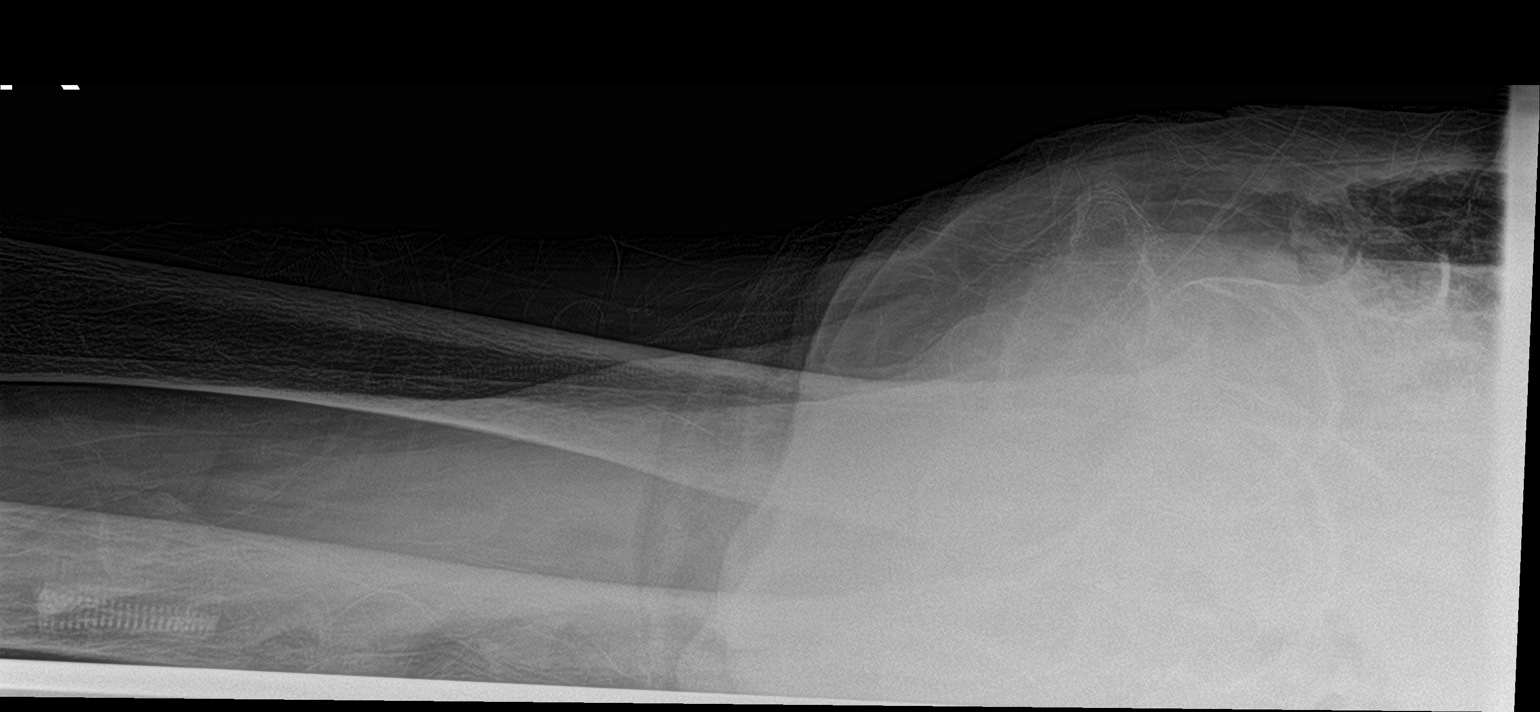

[3 of 3 positions shown; findings below may reference images not displayed]

FINDINGS: Right femoral neck fracture which is displaced. There is proximal
migration of the femoral shaft. The femoral head is well seated in
the acetabulum. Pubic rami are intact. Bones are diffusely under
mineralized. Pubic symphysis and sacroiliac joints are congruent.
IMPRESSION: Displaced right femoral neck fracture.
